# Patient Record
Sex: Female | Born: 1955 | Race: Black or African American | Hispanic: No | Marital: Married | Smoking: Never smoker
Health system: Southern US, Community
[De-identification: ages and names within clinical notes are randomized; demographics above are authoritative.]

## PROBLEM LIST (undated history)

## (undated) DIAGNOSIS — I10 Essential (primary) hypertension: Secondary | ICD-10-CM

## (undated) DIAGNOSIS — K5792 Diverticulitis of intestine, part unspecified, without perforation or abscess without bleeding: Secondary | ICD-10-CM

## (undated) DIAGNOSIS — C569 Malignant neoplasm of unspecified ovary: Secondary | ICD-10-CM

## (undated) HISTORY — PX: WRIST FRACTURE SURGERY: SHX121

---

## 1998-09-12 ENCOUNTER — Emergency Department (HOSPITAL_COMMUNITY): Admission: EM | Admit: 1998-09-12 | Discharge: 1998-09-12 | Payer: Self-pay

## 2001-07-24 ENCOUNTER — Emergency Department (HOSPITAL_COMMUNITY): Admission: EM | Admit: 2001-07-24 | Discharge: 2001-07-24 | Payer: Self-pay | Admitting: Emergency Medicine

## 2003-12-31 ENCOUNTER — Emergency Department (HOSPITAL_COMMUNITY): Admission: EM | Admit: 2003-12-31 | Discharge: 2003-12-31 | Payer: Self-pay | Admitting: Emergency Medicine

## 2004-03-06 ENCOUNTER — Ambulatory Visit (HOSPITAL_COMMUNITY): Admission: RE | Admit: 2004-03-06 | Discharge: 2004-03-06 | Payer: Self-pay | Admitting: Gastroenterology

## 2004-10-18 ENCOUNTER — Emergency Department (HOSPITAL_COMMUNITY): Admission: EM | Admit: 2004-10-18 | Discharge: 2004-10-18 | Payer: Self-pay | Admitting: Emergency Medicine

## 2004-10-20 ENCOUNTER — Ambulatory Visit (HOSPITAL_BASED_OUTPATIENT_CLINIC_OR_DEPARTMENT_OTHER): Admission: RE | Admit: 2004-10-20 | Discharge: 2004-10-20 | Payer: Self-pay | Admitting: Orthopedic Surgery

## 2007-09-09 ENCOUNTER — Emergency Department (HOSPITAL_COMMUNITY): Admission: EM | Admit: 2007-09-09 | Discharge: 2007-09-09 | Payer: Self-pay | Admitting: Emergency Medicine

## 2013-05-13 ENCOUNTER — Emergency Department (HOSPITAL_BASED_OUTPATIENT_CLINIC_OR_DEPARTMENT_OTHER)
Admission: EM | Admit: 2013-05-13 | Discharge: 2013-05-13 | Disposition: A | Discharge status: Home / Self Care | Payer: 59 | Attending: Emergency Medicine | Admitting: Emergency Medicine

## 2013-05-13 ENCOUNTER — Encounter (HOSPITAL_BASED_OUTPATIENT_CLINIC_OR_DEPARTMENT_OTHER): Payer: Self-pay | Admitting: Emergency Medicine

## 2013-05-13 DIAGNOSIS — J029 Acute pharyngitis, unspecified: Secondary | ICD-10-CM | POA: Insufficient documentation

## 2013-05-13 LAB — RAPID STREP SCREEN (MED CTR MEBANE ONLY): Streptococcus, Group A Screen (Direct): NEGATIVE

## 2013-05-13 NOTE — ED Notes (Signed)
Here with sore throat and bilateral ear pain x 5 days. No cold symptoms prior to sore throat

## 2013-05-13 NOTE — ED Provider Notes (Signed)
I saw and evaluated the patient, reviewed the resident's note and I agree with the findings and plan.  EKG Interpretation   None         Malvin Johns, MD 05/13/13 1444

## 2013-05-13 NOTE — ED Provider Notes (Signed)
CSN: [id number]     Arrival date & time 05/13/13  1304 History   First MD Initiated Contact with Patient 05/13/13 1320     Chief Complaint  Patient presents with  . Sore Throat   (Consider location/radiation/quality/duration/timing/severity/associated sxs/prior Treatment) Patient is a 57 y.o. female presenting with pharyngitis.  Sore Throat Associated symptoms include a sore throat. Pertinent negatives include no abdominal pain, chest pain, chills, congestion, coughing, fever or headaches.    57 year old female here with sore throat for 5 days. She states that her sore throat has gotten worse and now is improving slightly but still very sore. She denies any fevers, chills, sweats. She denies any obvious sick contacts or history of strep throat. She's been drinking normally but having painful swallowing food.  History reviewed. No pertinent past medical history. History reviewed. No pertinent past surgical history. No family history on file. History  Substance Use Topics  . Smoking status: Never Smoker   . Smokeless tobacco: Not on file  . Alcohol Use: Not on file   OB History   Grav Para Term Preterm Abortions TAB SAB Ect Mult Living                 Review of Systems  Constitutional: Negative for fever, chills and appetite change.  HENT: Positive for sore throat. Negative for congestion.   Respiratory: Negative for cough and shortness of breath.   Cardiovascular: Negative for chest pain.  Gastrointestinal: Negative for abdominal pain.  Genitourinary: Negative for dysuria.  Neurological: Negative for headaches.  All other systems reviewed and are negative.    Allergies  Review of patient's allergies indicates no known allergies.  Home Medications  No current outpatient prescriptions on file. BP 160/106  Pulse 85  Temp(Src) 99 F (37.2 C) (Oral)  Wt 184 lb (83.462 kg)  SpO2 96% Physical Exam  Nursing note and vitals reviewed. Constitutional: She is oriented to  person, place, and time. She appears well-developed and well-nourished. No distress.  HENT:  Head: Normocephalic and atraumatic.  Erythematous pharynx, tonsils swollen but no exudate, symmetric tonsils.   Eyes: EOM are normal. Pupils are equal, round, and reactive to light.  Neck: Neck supple.  Cardiovascular: Normal rate, regular rhythm and normal heart sounds.   No murmur heard. Pulmonary/Chest: Effort normal and breath sounds normal.  Abdominal: Soft. Bowel sounds are normal. There is no tenderness. There is no guarding.  Musculoskeletal: She exhibits no edema.  Lymphadenopathy:    She has cervical adenopathy.  Neurological: She is alert and oriented to person, place, and time.  Skin: Skin is warm and dry. She is not diaphoretic.  Psychiatric: She has a normal mood and affect.    ED Course  Procedures (including critical care time) Labs Review Labs Reviewed  RAPID STREP SCREEN  CULTURE, GROUP A STREP   Imaging Review No results found.  EKG Interpretation   None       MDM   1. Pharyngitis    57 year old female here with sore throat for 5 days. Strep negative here, we'll send for culture. On exam she is nontoxic nontoxic appearing, she's tolerating fluids easily, and she easily understands red flags provided. Discussed that his may be strep and that she will be contacted if she needs antibiotics,   Red flags reviewed Return for worsening symptoms.   Laroy Apple, MD [place] Resident, PGY-2 05/13/2013, 2:07 PM        Timmothy Euler, MD 05/13/13 [phone]

## 2013-05-15 LAB — CULTURE, GROUP A STREP

## 2016-06-10 DIAGNOSIS — I10 Essential (primary) hypertension: Secondary | ICD-10-CM | POA: Insufficient documentation

## 2016-07-07 LAB — HM MAMMOGRAPHY

## 2016-07-13 DIAGNOSIS — E785 Hyperlipidemia, unspecified: Secondary | ICD-10-CM | POA: Insufficient documentation

## 2016-07-31 LAB — HM COLONOSCOPY

## 2019-08-19 ENCOUNTER — Ambulatory Visit: Payer: Self-pay | Attending: Internal Medicine

## 2019-08-19 DIAGNOSIS — Z23 Encounter for immunization: Secondary | ICD-10-CM | POA: Insufficient documentation

## 2019-08-19 NOTE — Progress Notes (Signed)
   Covid-19 Vaccination Clinic  Name:  Tracy Goodman    MRN: [id number] DOB: [birth year]  08/19/2019  Tracy Goodman was observed post Covid-19 immunization for 15 minutes without incidence. She was provided with Vaccine Information Sheet and instruction to access the V-Safe system.   Tracy Goodman was instructed to call 911 with any severe reactions post vaccine: Marland Kitchen Difficulty breathing  . Swelling of your face and throat  . A fast heartbeat  . A bad rash all over your body  . Dizziness and weakness    Immunizations Administered    Name Date Dose VIS Date Route   Pfizer COVID-19 Vaccine 08/19/2019  4:22 PM 0.3 mL 06/02/2019 Intramuscular   Manufacturer: [place]   Lot: [id number]   [place]: [id number]

## 2019-09-09 ENCOUNTER — Ambulatory Visit: Payer: Self-pay | Attending: Internal Medicine

## 2019-09-09 DIAGNOSIS — Z23 Encounter for immunization: Secondary | ICD-10-CM

## 2019-09-09 NOTE — Progress Notes (Signed)
   Covid-19 Vaccination Clinic  Name:  Tracy Goodman    MRN: [id number] DOB: [birth year]  09/09/2019  [name] was observed post Covid-19 immunization for 15 minutes without incident. She was provided with Vaccine Information Sheet and instruction to access the V-Safe system.   [name] was instructed to call 911 with any severe reactions post vaccine: Marland Kitchen Difficulty breathing  . Swelling of face and throat  . A fast heartbeat  . A bad rash all over body  . Dizziness and weakness   Immunizations Administered    Name Date Dose VIS Date Route   Pfizer COVID-19 Vaccine 09/09/2019  9:48 AM 0.3 mL 06/02/2019 Intramuscular   Manufacturer: [place]   Lot: [id number]   [place]: [id number]

## 2019-09-13 ENCOUNTER — Ambulatory Visit: Payer: Self-pay

## 2020-03-12 ENCOUNTER — Other Ambulatory Visit: Payer: Self-pay

## 2020-03-12 ENCOUNTER — Encounter: Payer: Self-pay | Admitting: Family Medicine

## 2020-03-12 ENCOUNTER — Ambulatory Visit: Payer: BC Managed Care – PPO | Admitting: Family Medicine

## 2020-03-12 VITALS — BP 142/80 | HR 68 | Temp 97.6°F | Ht 59.5 in | Wt 259.5 lb

## 2020-03-12 DIAGNOSIS — E782 Mixed hyperlipidemia: Secondary | ICD-10-CM | POA: Diagnosis not present

## 2020-03-12 DIAGNOSIS — E049 Nontoxic goiter, unspecified: Secondary | ICD-10-CM

## 2020-03-12 DIAGNOSIS — Z23 Encounter for immunization: Secondary | ICD-10-CM

## 2020-03-12 DIAGNOSIS — I10 Essential (primary) hypertension: Secondary | ICD-10-CM

## 2020-03-12 NOTE — Assessment & Plan Note (Signed)
Encouraged weight loss and healthy diet

## 2020-03-12 NOTE — Progress Notes (Signed)
Subjective:     DAYANNE YIU is a 64 y.o. female presenting for Establish Care and Thyroid Problem (hx of this but feels like its getting bigger)     HPI   #Enlarged thyroid - feels like her thyroid is getting large - no nodules, just the size - feels like a lump is in her throat when she swallows  #Skin lesions - itchy - was worse - since it got hot - on the arms  #HTN - no hx of treatment - home monitoring is "never" high -    Review of Systems   Social History   Tobacco Use  Smoking Status Never Smoker  Smokeless Tobacco Never Used        Objective:    BP Readings from Last 3 Encounters:  03/12/20 (!) 142/80  05/13/13 (!) 160/106   Wt Readings from Last 3 Encounters:  03/12/20 259 lb 8 oz (117.7 kg)  05/13/13 184 lb (83.5 kg)    BP (!) 142/80   Pulse 68   Temp 97.6 F (36.4 C) (Temporal)   Ht 4' 11.5" (1.511 m)   Wt 259 lb 8 oz (117.7 kg)   SpO2 100%   BMI 51.54 kg/m    Physical Exam Constitutional:      General: She is not in acute distress.    Appearance: She is well-developed. She is not diaphoretic.  HENT:     Right Ear: External ear normal.     Left Ear: External ear normal.     Nose: Nose normal.  Eyes:     Conjunctiva/sclera: Conjunctivae normal.  Neck:     Thyroid: Thyromegaly present.  Cardiovascular:     Rate and Rhythm: Normal rate and regular rhythm.     Heart sounds: No murmur heard.   Pulmonary:     Effort: Pulmonary effort is normal. No respiratory distress.     Breath sounds: Normal breath sounds. No wheezing.  Musculoskeletal:     Cervical back: Neck supple.  Skin:    General: Skin is warm and dry.     Capillary Refill: Capillary refill takes less than 2 seconds.  Neurological:     Mental Status: She is alert. Mental status is at baseline.  Psychiatric:        Mood and Affect: Mood normal.        Behavior: Behavior normal.           Assessment & Plan:   Problem List Items Addressed This  Visit      Cardiovascular and Mediastinum   Hypertension    Encouraged home monitoring. Elevated today. Lifestyle changes. Return in 3 months for recheck. Labs today      Relevant Orders   Lipid panel   Comprehensive metabolic panel   TSH     Endocrine   Enlarged thyroid gland - Primary    Reports previously told it was enlarged, but unable to find prior imaging. Will get imaging as thyroid enlarged and check TSH      Relevant Orders   TSH   US THYROID     Other   Hyperlipidemia    Outside labs elevated from 2017. Will recheck today. Advised diet and exercise and weight loss.       Relevant Orders   Lipid panel   Morbid obesity ([place])    Encouraged weight loss and healthy diet      Relevant Orders   Lipid panel   Comprehensive metabolic panel  Return in about 3 months (around 06/11/2020) for annual wellness visit.  [name], MD  This visit occurred during the SARS-CoV-2 public health emergency.  Safety protocols were in place, including screening questions prior to the visit, additional usage of staff PPE, and extensive cleaning of exam room while observing appropriate contact time as indicated for disinfecting solutions.

## 2020-03-12 NOTE — Patient Instructions (Addendum)
Rash - try hydrocortisone (over the counter) cream - if no improvement - call and I can send in triamcinolone to try  Thyroid - repeat imaging - you should get a phone call in 1-2 weeks to schedule - labs today   Your blood pressure high. And your cholesterol has been high in the past.   High blood pressure increases your risk for heart attack and stroke.    Please check your blood pressure 2-4 times a week.   To check your blood pressure 1) Sit in a quiet and relaxed place for 5 minutes 2) Make sure your feet are flat on the ground 3) Consider checking first thing in the morning   Normal blood pressure is less than 140/90 Ideally you blood pressure should be around 120/80  Other ways you can reduce your blood pressure:  1) Regular exercise -- Try to get 150 minutes (30 minutes, 5 days a week) of moderate to vigorous aerobic excercise -- Examples: brisk walking (2.5 miles per hour), water aerobics, dancing, gardening, tennis, biking slower than 10 miles per hour 2) DASH Diet - low fat meats, more fresh fruits and vegetables, whole grains, low salt 3) Quit smoking if you smoke 4) Loose 5-10% of your body weight  DASH Eating Plan DASH stands for "Dietary Approaches to Stop Hypertension." The DASH eating plan is a healthy eating plan that has been shown to reduce high blood pressure (hypertension). It may also reduce your risk for type 2 diabetes, heart disease, and stroke. The DASH eating plan may also help with weight loss. What are tips for following this plan?  General guidelines  Avoid eating more than 2,300 mg (milligrams) of salt (sodium) a day. If you have hypertension, you may need to reduce your sodium intake to 1,500 mg a day.  Limit alcohol intake to no more than 1 drink a day for nonpregnant women and 2 drinks a day for men. One drink equals 12 oz of beer, 5 oz of wine, or 1 oz of hard liquor.  Work with your health care provider to maintain a healthy body weight  or to lose weight. Ask what an ideal weight is for you.  Get at least 30 minutes of exercise that causes your heart to beat faster (aerobic exercise) most days of the week. Activities may include walking, swimming, or biking.  Work with your health care provider or diet and nutrition specialist (dietitian) to adjust your eating plan to your individual calorie needs. Reading food labels   Check food labels for the amount of sodium per serving. Choose foods with less than 5 percent of the Daily Value of sodium. Generally, foods with less than 300 mg of sodium per serving fit into this eating plan.  To find whole grains, look for the word "whole" as the first word in the ingredient list. Shopping  Buy products labeled as "low-sodium" or "no salt added."  Buy fresh foods. Avoid canned foods and premade or frozen meals. Cooking  Avoid adding salt when cooking. Use salt-free seasonings or herbs instead of table salt or sea salt. Check with your health care provider or pharmacist before using salt substitutes.  Do not fry foods. Cook foods using healthy methods such as baking, boiling, grilling, and broiling instead.  Cook with heart-healthy oils, such as olive, canola, soybean, or sunflower oil. Meal planning  Eat a balanced diet that includes: ? 5 or more servings of fruits and vegetables each day. At each meal, try to fill  half of your plate with fruits and vegetables. ? Up to 6-8 servings of whole grains each day. ? Less than 6 oz of lean meat, poultry, or fish each day. A 3-oz serving of meat is about the same size as a deck of cards. One egg equals 1 oz. ? 2 servings of low-fat dairy each day. ? A serving of nuts, seeds, or beans 5 times each week. ? Heart-healthy fats. Healthy fats called Omega-3 fatty acids are found in foods such as flaxseeds and coldwater fish, like sardines, salmon, and mackerel.  Limit how much you eat of the following: ? Canned or prepackaged foods. ? Food  that is high in trans fat, such as fried foods. ? Food that is high in saturated fat, such as fatty meat. ? Sweets, desserts, sugary drinks, and other foods with added sugar. ? Full-fat dairy products.  Do not salt foods before eating.  Try to eat at least 2 vegetarian meals each week.  Eat more home-cooked food and less restaurant, buffet, and fast food.  When eating at a restaurant, ask that your food be prepared with less salt or no salt, if possible. What foods are recommended? The items listed may not be a complete list. Talk with your dietitian about what dietary choices are best for you. Grains Whole-grain or whole-wheat bread. Whole-grain or whole-wheat pasta. Brown rice. [place]. Bulgur. Whole-grain and low-sodium cereals. Pita bread. Low-fat, low-sodium crackers. Whole-wheat flour tortillas. Vegetables Fresh or frozen vegetables (raw, steamed, roasted, or grilled). Low-sodium or reduced-sodium tomato and vegetable juice. Low-sodium or reduced-sodium tomato sauce and tomato paste. Low-sodium or reduced-sodium canned vegetables. Fruits All fresh, dried, or frozen fruit. Canned fruit in natural juice (without added sugar). Meat and other protein foods Skinless chicken or [place]. Ground chicken or [place]. Pork with fat trimmed off. Fish and seafood. Egg whites. Dried beans, peas, or lentils. Unsalted nuts, nut butters, and seeds. Unsalted canned beans. Lean cuts of beef with fat trimmed off. Low-sodium, lean deli meat. Dairy Low-fat (1%) or fat-free (skim) milk. Fat-free, low-fat, or reduced-fat cheeses. Nonfat, low-sodium ricotta or cottage cheese. Low-fat or nonfat yogurt. Low-fat, low-sodium cheese. Fats and oils Soft margarine without trans fats. Vegetable oil. Low-fat, reduced-fat, or light mayonnaise and salad dressings (reduced-sodium). Canola, safflower, olive, soybean, and sunflower oils. Avocado. Seasoning and other foods Herbs. Spices. Seasoning mixes without salt.  Unsalted popcorn and pretzels. Fat-free sweets. What foods are not recommended? The items listed may not be a complete list. Talk with your dietitian about what dietary choices are best for you. Grains Baked goods made with fat, such as croissants, muffins, or some breads. Dry pasta or rice meal packs. Vegetables Creamed or fried vegetables. Vegetables in a cheese sauce. Regular canned vegetables (not low-sodium or reduced-sodium). Regular canned tomato sauce and paste (not low-sodium or reduced-sodium). Regular tomato and vegetable juice (not low-sodium or reduced-sodium). Angie Fava. Olives. Fruits Canned fruit in a light or heavy syrup. Fried fruit. Fruit in cream or butter sauce. Meat and other protein foods Fatty cuts of meat. Ribs. Fried meat. Berniece Salines. Sausage. Bologna and other processed lunch meats. Salami. Fatback. Hotdogs. Bratwurst. Salted nuts and seeds. Canned beans with added salt. Canned or smoked fish. Whole eggs or egg yolks. Chicken or [place] with skin. Dairy Whole or 2% milk, cream, and half-and-half. Whole or full-fat cream cheese. Whole-fat or sweetened yogurt. Full-fat cheese. Nondairy creamers. Whipped toppings. Processed cheese and cheese spreads. Fats and oils Butter. Stick margarine. Lard. Shortening. Ghee. Bacon fat. Tropical  oils, such as coconut, palm kernel, or palm oil. Seasoning and other foods Salted popcorn and pretzels. Onion salt, garlic salt, seasoned salt, table salt, and sea salt. Worcestershire sauce. Tartar sauce. Barbecue sauce. Teriyaki sauce. Soy sauce, including reduced-sodium. Steak sauce. Canned and packaged gravies. Fish sauce. Oyster sauce. Cocktail sauce. Horseradish that you find on the shelf. Ketchup. Mustard. Meat flavorings and tenderizers. Bouillon cubes. Hot sauce and Tabasco sauce. Premade or packaged marinades. Premade or packaged taco seasonings. Relishes. Regular salad dressings. Where to find more information:  National Heart, Lung, and [place]: https://wilson-eaton.com/  American Heart Association: www.heart.org Summary  The DASH eating plan is a healthy eating plan that has been shown to reduce high blood pressure (hypertension). It may also reduce your risk for type 2 diabetes, heart disease, and stroke.  With the DASH eating plan, you should limit salt (sodium) intake to 2,300 mg a day. If you have hypertension, you may need to reduce your sodium intake to 1,500 mg a day.  When on the DASH eating plan, aim to eat more fresh fruits and vegetables, whole grains, lean proteins, low-fat dairy, and heart-healthy fats.  Work with your health care provider or diet and nutrition specialist (dietitian) to adjust your eating plan to your individual calorie needs. This information is not intended to replace advice given to you by your health care provider. Make sure you discuss any questions you have with your health care provider. Document Revised: 05/21/2017 Document Reviewed: 06/01/2016 Elsevier Patient Education  2020 [employer].

## 2020-03-12 NOTE — Assessment & Plan Note (Signed)
Outside labs elevated from 2017. Will recheck today. Advised diet and exercise and weight loss.

## 2020-03-12 NOTE — Assessment & Plan Note (Signed)
Reports previously told it was enlarged, but unable to find prior imaging. Will get imaging as thyroid enlarged and check TSH

## 2020-03-12 NOTE — Assessment & Plan Note (Signed)
Encouraged home monitoring. Elevated today. Lifestyle changes. Return in 3 months for recheck. Labs today

## 2020-03-13 LAB — COMPREHENSIVE METABOLIC PANEL
ALT: 10 U/L (ref 0–35)
AST: 13 U/L (ref 0–37)
Albumin: 4 g/dL (ref 3.5–5.2)
Alkaline Phosphatase: 77 U/L (ref 39–117)
BUN: 12 mg/dL (ref 6–23)
CO2: 29 mEq/L (ref 19–32)
Calcium: 9.6 mg/dL (ref 8.4–10.5)
Chloride: 104 mEq/L (ref 96–112)
Creatinine, Ser: 1.05 mg/dL (ref 0.40–1.20)
GFR: 64.14 mL/min (ref >60.00)
Glucose, Bld: 89 mg/dL (ref 70–99)
Potassium: 4.2 mEq/L (ref 3.5–5.1)
Sodium: 140 mEq/L (ref 135–145)
Total Bilirubin: 0.4 mg/dL (ref 0.2–1.2)
Total Protein: 7.2 g/dL (ref 6.0–8.3)

## 2020-03-13 LAB — LIPID PANEL
Cholesterol: 216 mg/dL — ABNORMAL HIGH (ref 0–200)
HDL: 50.5 mg/dL (ref >39.00)
LDL Cholesterol: 146 mg/dL — ABNORMAL HIGH (ref 0–99)
NonHDL: 165.61
Total CHOL/HDL Ratio: 4
Triglycerides: 98 mg/dL (ref 0.0–149.0)
VLDL: 19.6 mg/dL (ref 0.0–40.0)

## 2020-03-13 LAB — TSH: TSH: 1.45 u[IU]/mL (ref 0.35–4.50)

## 2020-03-14 ENCOUNTER — Telehealth: Payer: Self-pay | Admitting: Family Medicine

## 2020-03-14 NOTE — Telephone Encounter (Signed)
FYI: Pt will wait on starting atorvastatin until she comes back for follow-up.

## 2020-03-14 NOTE — Telephone Encounter (Signed)
Pt called back stating she wants to hold off on medication until she see dr cody again

## 2020-03-27 ENCOUNTER — Ambulatory Visit: Payer: BC Managed Care – PPO

## 2020-06-12 ENCOUNTER — Encounter: Payer: BC Managed Care – PPO | Admitting: Family Medicine

## 2020-06-25 ENCOUNTER — Encounter: Payer: Self-pay | Admitting: Family Medicine

## 2020-06-25 ENCOUNTER — Other Ambulatory Visit: Payer: Self-pay | Admitting: Family Medicine

## 2020-06-25 ENCOUNTER — Ambulatory Visit (INDEPENDENT_AMBULATORY_CARE_PROVIDER_SITE_OTHER): Payer: Self-pay | Admitting: Family Medicine

## 2020-06-25 ENCOUNTER — Other Ambulatory Visit: Payer: Self-pay

## 2020-06-25 VITALS — BP 144/88 | HR 91 | Temp 97.8°F | Ht 60.0 in | Wt 247.8 lb

## 2020-06-25 DIAGNOSIS — Z0001 Encounter for general adult medical examination with abnormal findings: Secondary | ICD-10-CM

## 2020-06-25 DIAGNOSIS — I10 Essential (primary) hypertension: Secondary | ICD-10-CM

## 2020-06-25 DIAGNOSIS — Z1231 Encounter for screening mammogram for malignant neoplasm of breast: Secondary | ICD-10-CM

## 2020-06-25 DIAGNOSIS — E782 Mixed hyperlipidemia: Secondary | ICD-10-CM

## 2020-06-25 NOTE — Assessment & Plan Note (Signed)
She will work on diet/exercise/weight loss and return in 3 months.

## 2020-06-25 NOTE — Progress Notes (Signed)
Annual Exam   Chief Complaint:  Chief Complaint  Patient presents with  . Annual Exam    History of Present Illness:  [name] is a 65 y.o. No obstetric history on file. who LMP was No LMP recorded. Patient is postmenopausal., presents today for her annual examination.     Nutrition She does get adequate calcium and Vitamin D in her diet. Diet: green smoothies, more fish and chicken, no pork/beef Exercise: apartment gym weight liftings  Safety The patient wears seatbelts: yes.     The patient feels safe at home and in their relationships: yes.   Menstrual:  Symptoms of menopause: none   GYN She is single partner, contraception - post menopausal status.    Cervical Cancer Screening (21-65):   Last Pap:   December 2017 Results were: no abnormalities /neg HPV DNA   Breast Cancer Screening (Age 43-74):  There is no FH of breast cancer. There is no FH of ovarian cancer. BRCA screening Not Indicated.  Last Mammogram: 2018 The patient does want a mammogram this year.    Colon Cancer Screening:  Age 66-75 yo - benefits outweigh the risk. Adults 63-85 yo who have never been screened benefit.  Benefits: 134000 people in 2016 will be diagnosed and 49,000 will die - early detection helps [place]ms: Complications 2/2 to colonoscopy High Risk (Colonoscopy): genetic disorder (Lynch syndrome or familial adenomatous polyposis), personal hx of IBD, previous adenomatous polyp, or previous colorectal cancer, FamHx start 10 years before the age at diagnosis, increased in males and black race  Options:  FIT - looks for hemoglobin (blood in the stool) - specific and fairly sensitive - must be done annually Cologuard - looks for DNA and blood - more sensitive - therefore can have more false positives, every 3 years Colonoscopy - every 10 years if normal - sedation, bowl prep, must have someone drive you  Shared decision making and the patient had decided to do colonoscopy - last  was 2018.   Social History   Tobacco Use  Smoking Status Never Smoker  Smokeless Tobacco Never Used    Lung Cancer Screening (Ages 03-55): not applicable   Weight Wt Readings from Last 3 Encounters:  06/25/20 247 lb 12 oz (112.4 kg)  03/12/20 259 lb 8 oz (117.7 kg)  05/13/13 184 lb (83.5 kg)   Patient has very high BMI  BMI Readings from Last 1 Encounters:  06/25/20 48.39 kg/m     Chronic disease screening Blood pressure monitoring:  BP Readings from Last 3 Encounters:  06/25/20 (!) 144/88  03/12/20 (!) 142/80  05/13/13 (!) 160/106    Lipid Monitoring: Indication for screening: age >75, obesity, diabetes, family hx, CV risk factors.  Lipid screening: Not Indicated  Lab Results  Component Value Date   CHOL 216 (H) 03/12/2020   HDL 50.50 03/12/2020   LDLCALC 146 (H) 03/12/2020   TRIG 98.0 03/12/2020   CHOLHDL 4 03/12/2020     Diabetes Screening: age >69, overweight, family hx, PCOS, hx of gestational diabetes, at risk ethnicity Diabetes Screening screening: Yes  No results found for: HGBA1C   No past medical history on file.  Past Surgical History:  Procedure Laterality Date  . CESAREAN SECTION    . WRIST FRACTURE SURGERY Left     Prior to Admission medications   Medication Sig Start Date End Date Taking? Authorizing Provider  Naproxen Sod-diphenhydrAMINE (ALEVE PM PO) Take 220 mg by mouth as needed. For knee pain   Yes  [provider]    No Known Allergies  Gynecologic History: No LMP recorded. Patient is postmenopausal.  Obstetric History: No obstetric history on file.  Social History   Socioeconomic History  . Marital status: Married    Spouse name: Tempie Donning  . Number of children: 3  . Years of education: high school  . Highest education level: Not on file  Occupational History  . Not on file  Tobacco Use  . Smoking status: Never Smoker  . Smokeless tobacco: Never Used  Substance and Sexual Activity  . Alcohol use: Not  Currently  . Drug use: Never  . Sexual activity: Yes    Birth control/protection: Post-menopausal  Other Topics Concern  . Not on file  Social History Narrative   03/12/20   From: the area   Living: with Tempie Donning, husband (2012) and one step son   Work: school bus driver      Family: 3 adult children - [place], [place], Trilby Drummer - 6-7 grandchildren      Enjoys: singing in the choir at church      Exercise: weight lifting in the morning, stretches   Diet: varied, tries to eat healthy - baked salmon and fish/chicken/fried food      Safety   Seat belts: Yes    Guns: No   Safe in relationships: Yes    Social Determinants of Radio broadcast assistant Strain: Not on file  Food Insecurity: Not on file  Transportation Needs: Not on file  Physical Activity: Not on file  Stress: Not on file  Social Connections: Not on file  Intimate Partner Violence: Not on file    Family History  Problem Relation Age of Onset  . COPD Mother   . Heart failure Mother     Review of Systems  Constitutional: Negative for chills and fever.  HENT: Negative for congestion and sore throat.   Eyes: Negative for blurred vision and double vision.  Respiratory: Negative for shortness of breath.   Cardiovascular: Negative for chest pain.  Gastrointestinal: Negative for heartburn, nausea and vomiting.  Genitourinary: Negative.   Musculoskeletal: Negative.  Negative for myalgias.  Skin: Negative for rash.  Neurological: Negative for dizziness and headaches.  Endo/Heme/Allergies: Does not bruise/bleed easily.  Psychiatric/Behavioral: Negative for depression. The patient is not nervous/anxious.      Physical Exam BP (!) 144/88   Pulse 91   Temp 97.8 F (36.6 C) (Temporal)   Ht 5' (1.524 m)   Wt 247 lb 12 oz (112.4 kg)   SpO2 98%   BMI 48.39 kg/m    BP Readings from Last 3 Encounters:  06/25/20 (!) 144/88  03/12/20 (!) 142/80  05/13/13 (!) 160/106      Physical Exam Constitutional:       General: She is not in acute distress.    Appearance: She is well-developed and well-nourished. She is obese. She is not diaphoretic.  HENT:     Head: Normocephalic and atraumatic.     Right Ear: External ear normal.     Left Ear: External ear normal.     Nose: Nose normal.     Mouth/Throat:     Mouth: Oropharynx is clear and moist.  Eyes:     General: No scleral icterus.    Extraocular Movements: EOM normal.     Conjunctiva/sclera: Conjunctivae normal.  Cardiovascular:     Rate and Rhythm: Normal rate and regular rhythm.     Heart sounds: No murmur heard.   Pulmonary:  Effort: Pulmonary effort is normal. No respiratory distress.     Breath sounds: Normal breath sounds. No wheezing.  Abdominal:     General: Bowel sounds are normal. There is no distension.     Palpations: Abdomen is soft. There is no mass.     Tenderness: There is no abdominal tenderness. There is no guarding or rebound.  Musculoskeletal:        General: No edema. Normal range of motion.     Cervical back: Neck supple.  Lymphadenopathy:     Cervical: No cervical adenopathy.  Skin:    General: Skin is warm and dry.     Capillary Refill: Capillary refill takes less than 2 seconds.  Neurological:     Mental Status: She is alert and oriented to person, place, and time.     Deep Tendon Reflexes: Strength normal. Reflexes normal.  Psychiatric:        Mood and Affect: Mood and affect normal.        Behavior: Behavior normal.     Results:  PHQ-9:   Depression screen [facility] 2/9 03/12/2020  Decreased Interest 0  Down, Depressed, Hopeless 0  PHQ - 2 Score 0       Assessment: 65 y.o. No obstetric history on file. female here for routine annual physical examination.  Plan: Problem List Items Addressed This Visit      Cardiovascular and Mediastinum   Hypertension    She will work on diet/exercise/weight loss and return in 3 months.         Other   Hyperlipidemia    Discussed recommendation for  statin. She will work on diet/exercise/weight loss and recheck in 3 months.        Other Visit Diagnoses    Encounter for annual general medical examination with abnormal findings in adult    -  Primary      Screening: -- Blood pressure screen elevated: continued to monitor. -- cholesterol screening: elevated -- Weight screening: obese: discussed management options, including lifestyle, dietary, and exercise. -- Diabetes Screening: not due for screening -- Nutrition: Encouraged healthy diet  The 10-year ASCVD risk score Mikey Bussing DC Jr., et al., 2013) is: 9.3%   Values used to calculate the score:     Age: 34 years     Sex: Female     Is Non-Hispanic African American: Yes     Diabetic: No     Tobacco smoker: No     Systolic Blood Pressure: 767 mmHg     Is BP treated: No     HDL Cholesterol: 50.5 mg/dL     Total Cholesterol: 216 mg/dL  -- Statin therapy for Age 16-75 with CVD risk >7.5%  Psych -- Depression screening (PHQ-9):   Depression screen [facility] 2/9 03/12/2020  Decreased Interest 0  Down, Depressed, Hopeless 0  PHQ - 2 Score 0      Safety -- tobacco screening: not using -- alcohol screening:  low-risk usage. -- no evidence of domestic violence or intimate partner violence.   Cancer Screening -- pap smear not collected per ASCCP guidelines -- family history of breast cancer screening: done. not at high risk. -- Mammogram - pt will schedule -- Colon cancer (age 76+)-- up to date  Immunizations Immunization History  Administered Date(s) Administered  . Influenza,inj,Quad PF,6+ Mos 03/12/2020  . PFIZER SARS-COV-2 Vaccination 08/19/2019, 09/09/2019    -- flu vaccine up to date -- TDAP q10 years unknown, record requested -- Shingles (age >54) pt will consider getting --  Covid-19 Vaccine - encouraged getting booster   Encouraged healthy diet and exercise. Encouraged regular vision and dental care.    [name], MD

## 2020-06-25 NOTE — Patient Instructions (Addendum)
Call insurance about the ultrasound  Check with insurance about the shingles vaccine   Please call the location of your choice from the menu below to schedule your Mammogram and/or Bone Density appointment.     [place]  [address] [place] [facility]   Phone:  [phone]   [place], [place] [address]                                            Services: 3D Mammogram and Bone Density    Your blood pressure high.   High blood pressure increases your risk for heart attack and stroke.    Please check your blood pressure 2-4 times a week.   To check your blood pressure 1) Sit in a quiet and relaxed place for 5 minutes 2) Make sure your feet are flat on the ground 3) Consider checking first thing in the morning   Normal blood pressure is less than 140/90 Ideally you blood pressure should be around 120/80  Other ways you can reduce your blood pressure:  1) Regular exercise -- Try to get 150 minutes (30 minutes, 5 days a week) of moderate to vigorous aerobic excercise -- Examples: brisk walking (2.5 miles per hour), water aerobics, dancing, gardening, tennis, biking slower than 10 miles per hour 2) DASH Diet - low fat meats, more fresh fruits and vegetables, whole grains, low salt 3) Quit smoking if you smoke 4) Loose 5-10% of your body weight

## 2020-06-25 NOTE — Assessment & Plan Note (Signed)
Discussed recommendation for statin. She will work on diet/exercise/weight loss and recheck in 3 months.

## 2020-07-18 ENCOUNTER — Other Ambulatory Visit: Payer: Self-pay | Admitting: *Deleted

## 2020-07-18 ENCOUNTER — Ambulatory Visit
Admission: RE | Admit: 2020-07-18 | Discharge: 2020-07-18 | Disposition: A | Discharge status: Home / Self Care | Payer: BC Managed Care – PPO | Source: Ambulatory Visit | Attending: Family Medicine | Admitting: Family Medicine

## 2020-07-18 ENCOUNTER — Inpatient Hospital Stay
Admission: RE | Admit: 2020-07-18 | Discharge: 2020-07-18 | Disposition: A | Discharge status: Home / Self Care | Payer: Self-pay | Source: Ambulatory Visit | Attending: *Deleted | Admitting: *Deleted

## 2020-07-18 ENCOUNTER — Other Ambulatory Visit: Payer: Self-pay

## 2020-07-18 DIAGNOSIS — Z1231 Encounter for screening mammogram for malignant neoplasm of breast: Secondary | ICD-10-CM | POA: Diagnosis present

## 2020-09-02 ENCOUNTER — Telehealth: Payer: Self-pay

## 2020-09-02 NOTE — Telephone Encounter (Signed)
I tried to call pt; left v/m requesting pt to call [place]. Sending note to R Selin Eisler LPN and Christy CMA.

## 2020-09-02 NOTE — Telephone Encounter (Signed)
[facility] - Client TELEPHONE ADVICE RECORD [facility] Patient Name: Tracy Goodman Gender: Female DOB: 01-Dec-1955 Age: 65 Y 12 D Return Phone Number: [phone] (Primary) Address: City/State/Zip: [place] [address] Client [facility] - Client Client Site [facility] - Day Physician Waunita Schooner- MD Contact Type Call Who Is Calling Patient / Member / Family / Caregiver Call Type Triage / Clinical Relationship To Patient Self Return Phone Number [phone] (Primary) Chief Complaint Abdominal Pain Reason for Call Symptomatic / Request for [place] said she is having sharp lower abdominal pain(1/10) and unable to pass gas. 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 Translation No Nurse Assessment Nurse: Zenia Resides, RN, Diane Date/Time Eilene Ghazi Time): 09/02/2020 11:05:34 AM Confirm and document reason for call. If symptomatic, describe symptoms. ---Caller states she is having some lower abd pain that comes and goes. It is all the way across lower abd. Pain is severe when it comes. No pain right now. Last BM was yesterday. Started Friday. Does the patient have any new or worsening symptoms? ---Yes Will a triage be completed? ---Yes Related visit to physician within the last 2 weeks? ---No Does the PT have any chronic conditions? (i.e. diabetes, asthma, this includes High risk factors for pregnancy, etc.) ---No Is this a behavioral health or substance abuse call? ---No Guidelines Guideline Title Affirmed Question Affirmed Notes Nurse Date/Time Eilene Ghazi Time) Abdominal Pain - Female [1] MODERATE pain (e.g., interferes with normal activities) AND [2] pain comes and goes (cramps) AND [3] present > 24 hours (Exception: pain with Vomiting or Diarrhea - see that Guideline) Zenia Resides, RN, Diane 09/02/2020 11:07:12  AM Disp. Time Eilene Ghazi Time) Disposition Final User PLEASE NOTE: All timestamps contained within this report are represented as [place] Standard Time. CONFIDENTIALTY NOTICE: This fax transmission is intended only for the addressee. It contains information that is legally privileged, confidential or otherwise protected from use or disclosure. If you are not the intended recipient, you are strictly prohibited from reviewing, disclosing, copying using or disseminating any of this information or taking any action in reliance on or regarding this information. If you have received this fax in error, please notify us immediately by telephone so that we can arrange for its return to [place]. Phone: [phone], Toll-Free: [phone], Fax: [phone] Page: 2 of 2 Call Id: [id number] 09/02/2020 11:09:30 AM See PCP within 24 Hours Yes Zenia Resides, RN, Diane Caller Disagree/Comply Comply Caller Understands Yes PreDisposition Did not know what to do Care Advice Given Per Guideline SEE PCP WITHIN 24 HOURS: * IF OFFICE WILL BE OPEN: You need to be examined within the next 24 hours. Call your doctor (or NP/PA) when the office opens and make an appointment. DIET: * Drink adequate fluids. Eat a bland diet. OTC MEDS - BISMUTH SUBSALICYLATE (E.G., KAOPECTATE, PEPTO-BISMOL): * Helps reduce abdominal cramping, diarrhea, and vomiting. CALL BACK IF: * Severe pain lasts over 1 hour * Constant pain lasts over 2 hours CARE ADVICE given per Abdominal Pain, Female (Adult) guideline. Referrals REFERRED TO PCP OFFICE

## 2020-09-02 NOTE — Telephone Encounter (Signed)
Left v/m requesting pt to cb to [facility].

## 2020-09-03 NOTE — Telephone Encounter (Signed)
If fever, nausea or vomiting and inability to tolerate PO or worsening abdominal pain with distention -- needs to go to the ER  If no improvement but not worsening recommend UC or office visit as soon as able.

## 2020-09-03 NOTE — Telephone Encounter (Signed)
Patient called stating that she started with abdominal pain Friday. Patient stated at times the pain level is at a 9 but now it is about a 5-6. Patient stated that it is painful for her to have a BM. Patient stated that she was constipated and took a laxative. Patient stated that she has not had a fever or diarrhea. 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 Patient stated that the pain occurs everytime that she has a BM.No appointments today at the office.

## 2020-09-03 NOTE — Telephone Encounter (Signed)
Patient notified as instructed by telephone and verbalized understanding. Patient stated that she had a bowel movement about an hour ago and the pain has subsided at this time. Patient was offered an appointment tomorrow which patient declined stating that she is driving students tomorrow and unable to come for an appointment until after 5:00 pm. 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 Patient scheduled an appointment with Dr. Einar Pheasant Thursday 09/05/20 at 12:00 noon. Patient was given ER precautions again and she verbalized understanding.Marland Kitchen

## 2020-09-04 ENCOUNTER — Encounter (HOSPITAL_COMMUNITY): Payer: Self-pay | Admitting: Emergency Medicine

## 2020-09-04 ENCOUNTER — Emergency Department (HOSPITAL_COMMUNITY): Payer: BC Managed Care – PPO

## 2020-09-04 ENCOUNTER — Emergency Department (HOSPITAL_COMMUNITY)
Admission: EM | Admit: 2020-09-04 | Discharge: 2020-09-04 | Disposition: A | Discharge status: Home / Self Care | Payer: BC Managed Care – PPO | Attending: Emergency Medicine | Admitting: Emergency Medicine

## 2020-09-04 ENCOUNTER — Other Ambulatory Visit: Payer: Self-pay

## 2020-09-04 DIAGNOSIS — K5792 Diverticulitis of intestine, part unspecified, without perforation or abscess without bleeding: Secondary | ICD-10-CM

## 2020-09-04 DIAGNOSIS — K5732 Diverticulitis of large intestine without perforation or abscess without bleeding: Secondary | ICD-10-CM | POA: Insufficient documentation

## 2020-09-04 DIAGNOSIS — I10 Essential (primary) hypertension: Secondary | ICD-10-CM | POA: Diagnosis not present

## 2020-09-04 DIAGNOSIS — N760 Acute vaginitis: Secondary | ICD-10-CM | POA: Diagnosis not present

## 2020-09-04 DIAGNOSIS — B9689 Other specified bacterial agents as the cause of diseases classified elsewhere: Secondary | ICD-10-CM

## 2020-09-04 DIAGNOSIS — R1030 Lower abdominal pain, unspecified: Secondary | ICD-10-CM | POA: Diagnosis present

## 2020-09-04 LAB — CBC
HCT: 42 % (ref 36.0–46.0)
Hemoglobin: 12.6 g/dL (ref 12.0–15.0)
MCH: 27.3 pg (ref 26.0–34.0)
MCHC: 30 g/dL (ref 30.0–36.0)
MCV: 90.9 fL (ref 80.0–100.0)
Platelets: 312 10*3/uL (ref 150–400)
RBC: 4.62 MIL/uL (ref 3.87–5.11)
RDW: 13.4 % (ref 11.5–15.5)
WBC: 6.2 10*3/uL (ref 4.0–10.5)
nRBC: 0 % (ref 0.0–0.2)

## 2020-09-04 LAB — URINALYSIS, ROUTINE W REFLEX MICROSCOPIC
Bilirubin Urine: NEGATIVE
Glucose, UA: NEGATIVE mg/dL
Ketones, ur: 5 mg/dL — AB
Nitrite: NEGATIVE
Protein, ur: NEGATIVE mg/dL
Specific Gravity, Urine: 1.021 (ref 1.005–1.030)
pH: 6 (ref 5.0–8.0)

## 2020-09-04 LAB — COMPREHENSIVE METABOLIC PANEL
ALT: 15 U/L (ref 0–44)
AST: 21 U/L (ref 15–41)
Albumin: 3.4 g/dL — ABNORMAL LOW (ref 3.5–5.0)
Alkaline Phosphatase: 91 U/L (ref 38–126)
Anion gap: 10 (ref 5–15)
BUN: 12 mg/dL (ref 8–23)
CO2: 24 mmol/L (ref 22–32)
Calcium: 9.2 mg/dL (ref 8.9–10.3)
Chloride: 109 mmol/L (ref 98–111)
Creatinine, Ser: 0.9 mg/dL (ref 0.44–1.00)
GFR, Estimated: >60 mL/min (ref >60)
Glucose, Bld: 102 mg/dL — ABNORMAL HIGH (ref 70–99)
Potassium: 4.2 mmol/L (ref 3.5–5.1)
Sodium: 143 mmol/L (ref 135–145)
Total Bilirubin: 0.4 mg/dL (ref 0.3–1.2)
Total Protein: 7.5 g/dL (ref 6.5–8.1)

## 2020-09-04 LAB — WET PREP, GENITAL
Sperm: NONE SEEN
Trich, Wet Prep: NONE SEEN
Yeast Wet Prep HPF POC: NONE SEEN

## 2020-09-04 LAB — LIPASE, BLOOD: Lipase: 29 U/L (ref 11–51)

## 2020-09-04 MED ORDER — METRONIDAZOLE 500 MG PO TABS: 500.0000 mg | ORAL_TABLET | Freq: Two times a day (BID) | ORAL | 0 refills | Status: DC

## 2020-09-04 MED ORDER — AMOXICILLIN-POT CLAVULANATE 875-125 MG PO TABS: 1.0000 | ORAL_TABLET | Freq: Two times a day (BID) | ORAL | 0 refills | Status: AC

## 2020-09-04 MED ORDER — IOHEXOL 300 MG/ML  SOLN
100.0000 mL | Freq: Once | INTRAMUSCULAR | Status: AC | PRN
  Administered 2020-09-04: 100 mL via INTRAVENOUS

## 2020-09-04 NOTE — Discharge Instructions (Addendum)
You came to the emergency department today to be evaluated for your dental pain.  Your lab work was reassuring.  Your CT scan showed signs of diverticulitis infection.  Because of this I have given you a prescription for Augmentin.  Please take 1 pill twice a day for the next 10 days.  I have also given you information to follow-up with a gastroenterologist.  Please call their office later today to schedule an appointment for follow-up.  Your wet prep exam showed that you have a bacterial vaginosis infection.  This have given you prescription for metronidazole this take 1 pill twice a day for the next 7 days.  These follow-up with your OB/GYN provider or primary care provider for follow-up.  Your CT scan also showed that you had small umbilical and bilateral inguinal hernias.  Patient to follow-up with general surgery about this.  The results for your gonorrhea chlamydia tests are pending.  If positive you will receive a phone call.  If your tests are positive please go to your primary care provider or [facility] department immediately to receive treatment.  You may also find your results on your [facility] MyChart.  Get help right away if: Your pain gets worse. Your symptoms do not get better with treatment. Your symptoms suddenly get worse. You have a fever. You vomit more than one time. You have stools that are bloody, black, or tarry.

## 2020-09-04 NOTE — ED Triage Notes (Signed)
Patient complaining of lower abdominal pain and sob. Patient states that the lower abdominal pain started last Friday. The sob started this mornining

## 2020-09-04 NOTE — ED Provider Notes (Addendum)
[facility] DEPT Provider Note   CSN: [id number] Arrival date & time: 09/04/20  9381     History Chief Complaint  Patient presents with   Abdominal Pain    Tracy Goodman is a 65 y.o. female with a history of morbid obesity, hypertension, hyperlipidemia, cesarean section x1.  This was a chief complaint of lower abdominal pain.  Patient reports that her pain began on 08/30/2020.  Pain has progressively worsened since then.  Patient pain is intermittent and she only experiences it when sitting upright.  Patient's pain is completely resolved when laying in supine position.  Pain is worse with bowel movements or passing flatus.  Patient states that when she has pain it is 8/10 on the pain scale.  Patient describes her pain as a "pressure."  Patient denies any radiation of her pain.  Patient states that on Saturday she noticed a bulging around her umbilicus as well as pain.  Denies any history of strenuous activity or heavy lifting. Patient denies any nausea or vomiting, blood in stool, melena, constipation or diarrhea.  Patient reports that her last bowel movement was yesterday.  Patient reports that she was able to pass flatus this morning.  Patient denies any fevers, chills, dysuria, urinary frequency, hematuria, vaginal bleeding, vaginal pain, vaginal discharge.  G3 P3-0-0-3.  Cesarean section x1.  HPI     History reviewed. No pertinent past medical history.  Patient Active Problem List   Diagnosis Date Noted   Enlarged thyroid gland 03/12/2020   Hyperlipidemia 07/13/2016   Hypertension 06/10/2016   Morbid obesity ([place]) 06/10/2016    Past Surgical History:  Procedure Laterality Date   CESAREAN SECTION     WRIST FRACTURE SURGERY Left      OB History   No obstetric history on file.     Family History  Problem Relation Age of Onset   COPD Mother    Heart failure Mother     Social History   Tobacco Use   Smoking status: Never  Smoker   Smokeless tobacco: Never Used  Substance Use Topics   Alcohol use: Not Currently   Drug use: Never    Home Medications Prior to Admission medications   Medication Sig Start Date End Date Taking? Authorizing Provider  Naproxen Sod-diphenhydrAMINE (ALEVE PM PO) Take 220 mg by mouth as needed. For knee pain    [provider]    Allergies    Patient has no known allergies.  Review of Systems   Review of Systems  Constitutional: Negative for chills and fever.  Eyes: Negative for visual disturbance.  Respiratory: Negative for cough and shortness of breath.   Cardiovascular: Negative for chest pain.  Gastrointestinal: Positive for abdominal pain. Negative for abdominal distention, anal bleeding, blood in stool, constipation, diarrhea, nausea, rectal pain and vomiting.  Genitourinary: Negative for decreased urine volume, difficulty urinating, dysuria, flank pain, hematuria, vaginal bleeding, vaginal discharge and vaginal pain.  Musculoskeletal: Negative for back pain and neck pain.  Skin: Negative for color change and rash.  Neurological: Negative for dizziness, syncope, light-headedness and headaches.  Psychiatric/Behavioral: Negative for confusion.    Physical Exam Updated Vital Signs BP (!) 157/70    Pulse 60    Temp 97.6 F (36.4 C) (Oral)    Resp 19    Ht _0  (1.499 m)    Wt 113.4 kg    SpO2 100%    BMI 50.49 kg/m   Physical Exam Vitals and nursing note reviewed. Exam  conducted with a chaperone present (Female nurse tech present).  Constitutional:      General: She is not in acute distress.    Appearance: She is morbidly obese. She is not ill-appearing, toxic-appearing or diaphoretic.  HENT:     Head: Normocephalic.  Eyes:     General: No scleral icterus.       Right eye: No discharge.        Left eye: No discharge.  Cardiovascular:     Rate and Rhythm: Normal rate.     Heart sounds: Normal heart sounds.  Pulmonary:     Effort: Pulmonary effort  is normal. No tachypnea, bradypnea or respiratory distress.     Breath sounds: Normal breath sounds. No stridor. No decreased breath sounds, wheezing, rhonchi or rales.     Comments: Able to speak in full complete sentences without difficulty Abdominal:     General: Abdomen is protuberant. There is no distension. There are no signs of injury.     Palpations: Abdomen is soft. There is no mass or pulsatile mass.     Tenderness: There is abdominal tenderness in the right lower quadrant, periumbilical area and left lower quadrant. There is no right CVA tenderness, left CVA tenderness, guarding or rebound. Negative signs include McBurney's sign and psoas sign.     Hernia: There is no hernia in the umbilical area or ventral area.     Comments: Physical exam difficult due to patient's body habitus No umbilical or ventral hernia was appreciated  Genitourinary:    Exam position: Lithotomy position.     Pubic Area: No rash or pubic lice.      Tanner stage (genital): 5.     Labia:        Right: No rash, tenderness, lesion or injury.        Left: No rash, tenderness, lesion or injury.      Urethra: No prolapse, urethral pain, urethral swelling or urethral lesion.     Vagina: No signs of injury and foreign body. Vaginal discharge present. No erythema, tenderness, bleeding, lesions or prolapsed vaginal walls.     Cervix: No cervical motion tenderness, discharge, friability, lesion, erythema, cervical bleeding or eversion.     Uterus: Not enlarged, not tender and no uterine prolapse.      Adnexa: Right adnexa normal and left adnexa normal.     Comments: Scant grayish-white discharge noted in vaginal vault Musculoskeletal:     Cervical back: Neck supple.     Right lower leg: No edema.     Left lower leg: No edema.  Skin:    General: Skin is warm and dry.  Neurological:     General: No focal deficit present.     Mental Status: She is alert.     GCS: GCS eye subscore is 4. GCS verbal subscore is 5. GCS  motor subscore is 6.  Psychiatric:        Behavior: Behavior is cooperative.     ED Results / Procedures / Treatments   Labs (all labs ordered are listed, but only abnormal results are displayed) Labs Reviewed  WET PREP, GENITAL - Abnormal; Notable for the following components:      Result Value   Clue Cells Wet Prep HPF POC PRESENT (*)    WBC, Wet Prep HPF POC MODERATE (*)    All other components within normal limits  COMPREHENSIVE METABOLIC PANEL - Abnormal; Notable for the following components:   Glucose, Bld 102 (*)    Albumin  3.4 (*)    All other components within normal limits  URINALYSIS, ROUTINE W REFLEX MICROSCOPIC - Abnormal; Notable for the following components:   APPearance CLOUDY (*)    Hgb urine dipstick SMALL (*)    Ketones, ur 5 (*)    Leukocytes,Ua SMALL (*)    Bacteria, UA RARE (*)    All other components within normal limits  URINE CULTURE  LIPASE, BLOOD  CBC  GC/CHLAMYDIA PROBE AMP ([place]) NOT AT [facility]    EKG EKG Interpretation  Date/Time:  Wednesday September 04 2020 06:26:33 EDT Ventricular Rate:  77 PR Interval:    QRS Duration: 86 QT Interval:  378 QTC Calculation: 428 R Axis:   35 Text Interpretation: Sinus rhythm Normal ECG Confirmed by Orpah Greek [phone]) on 09/04/2020 6:50:58 AM   Radiology DG Chest 2 View  Result Date: 09/04/2020 CLINICAL DATA:  Shortness of breath EXAM: CHEST - 2 VIEW COMPARISON:  None available FINDINGS: Artifact from EKG leads. Normal heart size and mediastinal contours. No acute infiltrate or edema. No effusion or pneumothorax. No acute osseous findings. IMPRESSION: No evidence of active disease. Electronically Signed   By: Monte Fantasia M.D.   On: 09/04/2020 07:34   US Pelvis Complete  Result Date: 09/04/2020 CLINICAL DATA:  Pelvic inflammation noted on CT examination EXAM: TRANSABDOMINAL ULTRASOUND OF PELVIS TECHNIQUE: Transabdominal ultrasound examination of the pelvis was performed including  evaluation of the uterus, ovaries, adnexal regions, and pelvic cul-de-sac. COMPARISON:  None. CT abdomen and pelvis September 04, 2020 FINDINGS: Uterus Measurements: 10.8 x 5.4 x 7.5 cm = volume: 230.4 mL. No fibroids or other mass visualized. Endometrium Thickness: 5 mm.  No focal abnormality visualized. Right ovary Measurements: 3.4 x 2.4 x 2.8 cm = volume: 11.6 mL. Normal appearance/no adnexal mass. Left ovary Measurements: 4.0 x 2.1 x 2.6 cm = volume: 11.8 mL. Normal appearance/no adnexal mass. Other findings:  No abnormal free fluid. IMPRESSION: No abnormality appreciable by ultrasound. Electronically Signed   By: Lowella Grip III M.D.   On: 09/04/2020 11:04   CT Abdomen Pelvis W Contrast  Result Date: 09/04/2020 CLINICAL DATA:  Abdominal pain EXAM: CT ABDOMEN AND PELVIS WITH CONTRAST TECHNIQUE: Multidetector CT imaging of the abdomen and pelvis was performed using the standard protocol following bolus administration of intravenous contrast. CONTRAST:  141m OMNIPAQUE IOHEXOL 300 MG/ML  SOLN COMPARISON:  None. FINDINGS: Lower chest: No acute abnormality. Normal size heart. No pericardial effusion. Small hiatal hernia. Hepatobiliary: No focal liver abnormality is seen. No gallstones, gallbladder wall thickening, or biliary dilatation. Pancreas: Unremarkable. No pancreatic ductal dilatation or surrounding inflammatory changes. Spleen: Normal in size without focal abnormality. Adrenals/Urinary Tract: Adrenal glands are unremarkable. Kidneys are normal, without renal calculi, focal lesion, or hydronephrosis. There is mild bladder wall thickening with perivesicular stranding. Stomach/Bowel: Small hiatal hernia otherwise the stomach is unremarkable for degree of distension. No suspicious small bowel wall thickening or dilation. A nondilated appendix appears be located within the right lower quadrant on image 63/2. Terminal ileum appears unremarkable. There is extensive colonic diverticulosis with a short segment  of mild sigmoid colonic wall thickening in the left hemipelvis and fat stranding throughout the pelvis with trace pelvic free fluid and fluid layering along the left pericolic gutter. Vascular/Lymphatic: No abdominal aortic aneurysm. No pathologically enlarged abdominal or pelvic lymph nodes. However, there are prominent right greater than left iliac and pelvic sidewall lymph nodes for instance there is a 9 mm right external iliac lymph node on image 63/2  and an 8 mm right pelvic sidewall lymph node image 66/2. Reproductive: Gas in the vagina with apparent irregular thickening and enhancement of the vagina for instance on image 77/2. Other: Small fat containing umbilical hernia and bilateral inguinal hernias. Musculoskeletal: Thoracic diffuse idiopathic skeletal hyperostosis. Lumbar degenerative disease. No acute osseous abnormality. IMPRESSION: 1. Gas in the vagina with apparent irregular thickening and enhancement as well as inflammatory stranding throughout the pelvis, which may represent pelvic inflammatory disease, recommend clinical correlation and pelvic ultrasound for further evaluation. 2. Colonic diverticulosis with a short segment mild sigmoid colonic wall thickening in the left hemipelvis with inflammatory stranding throughout the pelvis, findings which are favored reactive and related to under distension however diverticulitis/short-segment colitis not entirely excluded on imaging. 3. Mild bladder wall thickening with perivesicular stranding may be reactive or represent cystitis. Correlation with urinalysis is recommended. 4. Prominent right greater than left iliac and pelvic sidewall lymph nodes, likely reactive. 5. Small fat containing umbilical and bilateral inguinal hernias. Electronically Signed   By: Dahlia Bailiff MD   On: 09/04/2020 10:08    Procedures Procedures   Medications Ordered in ED Medications  iohexol (OMNIPAQUE) 300 MG/ML solution 100 mL (100 mLs Intravenous Contrast Given  09/04/20 0913)    ED Course  I have reviewed the triage vital signs and the nursing notes.  Pertinent labs & imaging results that were available during my care of the patient were reviewed by me and considered in my medical decision making (see chart for details).    MDM Rules/Calculators/A&P                          Alert 65 year old female no acute distress, nontoxic-appearing.  Patient presents with chief complaint of lower abdominal pain progressively worsening over the weekend.  Patient reports she only experiences pain when sitting upright.  Also reports that she developed a bulge in her umbilicus over this weekend that was tender to palpation.  Denies any strenuous activity or heavy lifting.  Patient denies any nausea or vomiting, blood in stool, melena, constipation or diarrhea.  Patient reports that her last bowel movement was yesterday.  Patient reports that she was able to pass flatus this morning.  History of C-section x1.  Physical exam is difficult due to patient's morbid obesity.  Normoactive bowel sounds, abdomen soft, nondistended, tenderness to left lower quadrant, right lower quadrant, and periumbilical.  No ventral or umbilical hernia appreciated.  CVA tenderness negative.  Negative psoas sign.  Tenderness over McBurney's point.  Low suspicion for hepatobiliary disease as AST, ALT, alk phos, total bili all within normal limits, no right upper quadrant pain and pain is not changed after eating. Low suspicion for acute pancreatitis as lipase within normal limits and no complaints of epigastric pain or upper quadrant pain. Low suspicion for bowel obstruction as patient passing flatus and able to have normal bowel movement yesterday. CBC is unremarkable X-ray shows no acute cardiopulmonary disease. EKG normal sinus rhythm.  UA pending.  We will obtain contrast CT scan of abdomen pelvis to evaluate for diverticulitis, appendicitis, as well as hernia. CT scan shows: 1) gas in  the vagina with apparent irregular thickening and enhancement as well as inflammatory stranding throughout the pelvis, which may represent PID, recommended clinical correlation and pelvic ultrasound for further evaluation. 2) colonic diverticulosis with short segment mild sigmoid colonic wall thickening in the left hemipelvis with inflammatory stranding throughout the pelvis, findings which are favored reactive and  related to under distention however diverticulitis/short segment colitis not entirely excluded on imaging 3) mild bladder wall thickening with perivascular stranding may be reactive or represent cystitis correlation with urinalysis is recommended 4) prominent right greater than left iliac and pelvic sidewall lymph nodes, likely reactive 5) small fat-containing umbilical and bilateral inguinal hernias.  Urinalysis shows bacteria rare, WBC 11-20, leukocyte small, nitrate negative, squamous epithelial 21-50.  Low suspicion for cystitis as patient has no urinary frequency, dysuria, decreased urinary output.  We will send urine for culture.  Pelvic ultrasound ordered.  Pelvic ultrasound showed no abnormality appreciable by ultrasound.  Pelvic exam was performed with female nurse tech present in room.  Patient has no cervical motion tenderness, minimal grayish-white discharge noted in vaginal vault.  Gonorrhea and Chlamydia testing obtained.  Wet prep obtained.  Wet prep shows clue cells present.  Will treat patient for bacterial vaginosis with Flagyl. Discussed with patient that gonorrhea and Chlamydia results will not be resulted for 24 to 72 hours.  Shared decision making with patient to defer treatment until results are pended as she has a low suspicion for STI infection due to lack of symptoms as well as being in any mutually monogamous relationship with her husband.  On serial reexamination patient's abdomen remained soft, nondistended, nontender.  Patient remained hemodynamically stable.   Patient was noted to be hypertensive however reports she has not taken her hypertension medication.    We will treat patient for diverticulitis with prescription of Augmentin.  We will have patient follow-up with her primary care provider as well as the [facility].  Discussed follow-up with general surgery for patient's hernias, patient will discuss this matter further with her primary care provider.  Discussed results, findings, treatment and follow up. Patient advised of return precautions. Patient verbalized understanding and agreed with plan.  Patient care and treatment were discussed with Dr. Gilford Raid.  Final Clinical Impression(s) / ED Diagnoses Final diagnoses:  Diverticulitis  Bacterial vaginosis    Rx / DC Orders ED Discharge Orders         Ordered    amoxicillin-clavulanate (AUGMENTIN) 875-125 MG tablet  Every 12 hours        09/04/20 1233    metroNIDAZOLE (FLAGYL) 500 MG tablet  2 times daily        09/04/20 1233           Loni Beckwith, PA-C 09/04/20 1828    Loni Beckwith, PA-C 09/04/20 1831    Isla Pence, MD 09/05/20 [phone]

## 2020-09-05 ENCOUNTER — Encounter: Payer: Self-pay | Admitting: Family Medicine

## 2020-09-05 ENCOUNTER — Ambulatory Visit: Payer: BC Managed Care – PPO | Admitting: Family Medicine

## 2020-09-05 ENCOUNTER — Other Ambulatory Visit: Payer: Self-pay

## 2020-09-05 VITALS — BP 126/74 | HR 90 | Temp 96.9°F | Wt 242.2 lb

## 2020-09-05 DIAGNOSIS — K429 Umbilical hernia without obstruction or gangrene: Secondary | ICD-10-CM | POA: Insufficient documentation

## 2020-09-05 DIAGNOSIS — K5792 Diverticulitis of intestine, part unspecified, without perforation or abscess without bleeding: Secondary | ICD-10-CM | POA: Diagnosis not present

## 2020-09-05 DIAGNOSIS — I10 Essential (primary) hypertension: Secondary | ICD-10-CM

## 2020-09-05 DIAGNOSIS — E782 Mixed hyperlipidemia: Secondary | ICD-10-CM

## 2020-09-05 LAB — GC/CHLAMYDIA PROBE AMP (~~LOC~~) NOT AT ARMC
Chlamydia: NEGATIVE
Comment: NEGATIVE
Comment: NORMAL
Neisseria Gonorrhea: NEGATIVE

## 2020-09-05 NOTE — Assessment & Plan Note (Signed)
Working on weight loss. Recheck in 1 month with lab appt

## 2020-09-05 NOTE — Progress Notes (Signed)
Subjective:     Tracy Goodman is a 65 y.o. female presenting for ER Follow Up (/)     HPI  #Diverticulitis - pain was severe and is improving on abx - also treated for BV  #HTN - has been making changes in her diet due to her high blood pressure - eating smoothies - eating fish and chicken - less ground beef  Review of Systems  09/04/2020: ER - abdominal pain - CT with diverticulitis, gas in vaginal area. Treated with flagyl for BV and augmentin for diverticulitis  Social History   Tobacco Use  Smoking Status Never Smoker  Smokeless Tobacco Never Used        Objective:    BP Readings from Last 3 Encounters:  09/05/20 126/74  09/04/20 (!) 171/103  06/25/20 (!) 144/88   Wt Readings from Last 3 Encounters:  09/05/20 242 lb 4 oz (109.9 kg)  09/04/20 250 lb (113.4 kg)  06/25/20 247 lb 12 oz (112.4 kg)    BP 126/74 (BP Location: Left Arm, Patient Position: Sitting, Cuff Size: Large)   Pulse 90   Temp (!) 96.9 F (36.1 C) (Temporal)   Wt 242 lb 4 oz (109.9 kg)   SpO2 100%   BMI 48.93 kg/m    Physical Exam Constitutional:      General: She is not in acute distress.    Appearance: She is well-developed. She is not diaphoretic.  HENT:     Right Ear: External ear normal.     Left Ear: External ear normal.     Nose: Nose normal.  Eyes:     Conjunctiva/sclera: Conjunctivae normal.  Cardiovascular:     Rate and Rhythm: Normal rate and regular rhythm.     Heart sounds: No murmur heard.   Pulmonary:     Effort: Pulmonary effort is normal. No respiratory distress.     Breath sounds: Normal breath sounds. No wheezing.  Abdominal:     General: Abdomen is flat. Bowel sounds are normal. There is distension.     Palpations: Abdomen is soft.     Tenderness: There is abdominal tenderness (LLQ and umbilical). There is no guarding or rebound.  Musculoskeletal:     Cervical back: Neck supple.  Skin:    General: Skin is warm and dry.     Capillary Refill:  Capillary refill takes less than 2 seconds.  Neurological:     Mental Status: She is alert. Mental status is at baseline.  Psychiatric:        Mood and Affect: Mood normal.        Behavior: Behavior normal.     The 10-year ASCVD risk score Mikey Bussing DC Jr., et al., 2013) is: 7.1%   Values used to calculate the score:     Age: 68 years     Sex: Female     Is Non-Hispanic African American: Yes     Diabetic: No     Tobacco smoker: No     Systolic Blood Pressure: 619 mmHg     Is BP treated: No     HDL Cholesterol: 50.5 mg/dL     Total Cholesterol: 216 mg/dL       Assessment & Plan:   Problem List Items Addressed This Visit      Cardiovascular and Mediastinum   Hypertension    BP improved. Continue lifestyle changes        Other   Hyperlipidemia - Primary    Working on weight loss. Recheck in  1 month with lab appt      Relevant Orders   Lipid panel   Umbilical hernia    Asymptomatic. Noted on CT scan. Discussed red flag signs. She will call if becoming symptomatic. Encouraged weight loss.        Other Visit Diagnoses    Diverticulitis         Reviewed ER - symptoms improved with flagyl and augmentin. Discussed completing abx if and if persisting to call.    Return if symptoms worsen or fail to improve.  [name], MD  This visit occurred during the SARS-CoV-2 public health emergency.  Safety protocols were in place, including screening questions prior to the visit, additional usage of staff PPE, and extensive cleaning of exam room while observing appropriate contact time as indicated for disinfecting solutions.

## 2020-09-05 NOTE — Patient Instructions (Addendum)
Return 1 month to check cholesterol  Return if symptoms do not resolve

## 2020-09-05 NOTE — Assessment & Plan Note (Signed)
BP improved. Continue lifestyle changes

## 2020-09-05 NOTE — Assessment & Plan Note (Signed)
Asymptomatic. Noted on CT scan. Discussed red flag signs. She will call if becoming symptomatic. Encouraged weight loss.

## 2020-09-06 LAB — URINE CULTURE

## 2020-09-13 ENCOUNTER — Telehealth: Payer: Self-pay

## 2020-09-13 NOTE — Telephone Encounter (Signed)
Patient called and stated that she was in the shower last night and noticed a dime sized lump on the side of her R breast that is slightly tender. Patient denied redness, swelling, or any abnormal discharge. Mammogram completed on 07/18/20 was normal. Patient stated that she was ok waiting until her appointment on 4/4 with Dr. Einar Pheasant, but just wanted to make her aware. Instructed patient that if lump became bigger, more painful, reddened, increased swelling and nipple discharge before then to call back. Patient verbalized understanding.

## 2020-09-13 NOTE — Telephone Encounter (Signed)
Agree with plan to call or come in sooner if worsening or increasing in size.

## 2020-09-23 ENCOUNTER — Encounter: Payer: Self-pay | Admitting: Family Medicine

## 2020-09-23 ENCOUNTER — Other Ambulatory Visit: Payer: Self-pay

## 2020-09-23 ENCOUNTER — Ambulatory Visit: Payer: BC Managed Care – PPO | Admitting: Family Medicine

## 2020-09-23 VITALS — BP 130/78 | HR 107 | Temp 97.7°F | Ht 60.0 in | Wt 240.0 lb

## 2020-09-23 DIAGNOSIS — E049 Nontoxic goiter, unspecified: Secondary | ICD-10-CM | POA: Diagnosis not present

## 2020-09-23 DIAGNOSIS — K5792 Diverticulitis of intestine, part unspecified, without perforation or abscess without bleeding: Secondary | ICD-10-CM | POA: Diagnosis not present

## 2020-09-23 DIAGNOSIS — E782 Mixed hyperlipidemia: Secondary | ICD-10-CM

## 2020-09-23 DIAGNOSIS — N6311 Unspecified lump in the right breast, upper outer quadrant: Secondary | ICD-10-CM | POA: Insufficient documentation

## 2020-09-23 LAB — LIPID PANEL
Cholesterol: 205 mg/dL — ABNORMAL HIGH (ref 0–200)
HDL: 46.5 mg/dL (ref >39.00)
LDL Cholesterol: 132 mg/dL — ABNORMAL HIGH (ref 0–99)
NonHDL: 158.23
Total CHOL/HDL Ratio: 4
Triglycerides: 129 mg/dL (ref 0.0–149.0)
VLDL: 25.8 mg/dL (ref 0.0–40.0)

## 2020-09-23 MED ORDER — AMOXICILLIN-POT CLAVULANATE 875-125 MG PO TABS: 1.0000 | ORAL_TABLET | Freq: Two times a day (BID) | ORAL | 0 refills | Status: DC

## 2020-09-23 NOTE — Assessment & Plan Note (Signed)
Pt unable to get imaging due to cost. Encouraged getting in the future. If lymph node increases in size will readdress imaging.

## 2020-09-23 NOTE — Progress Notes (Signed)
Subjective:     Tracy Goodman is a 65 y.o. female presenting for Breast Mass (Right breast x 1 week right neck x 3 days )     HPI  #Breast Mass - felt a lump in the right breast - seems to have moved down - was tender - but no longer sore - noticed this 1 week ago  Lump on the left neck - sore and was large but decreasing in size - started on Saturday - had pulse sensation to it  Notes some side pain Difficulty passing gas No constipation Notes urinary frequency w/o dysuria    Review of Systems  Constitutional: Negative for chills and fever.  Gastrointestinal:       Difficulty passing gas     Social History   Tobacco Use  Smoking Status Never Smoker  Smokeless Tobacco Never Used        Objective:    BP Readings from Last 3 Encounters:  09/23/20 130/78  09/05/20 126/74  09/04/20 (!) 171/103   Wt Readings from Last 3 Encounters:  09/23/20 240 lb (108.9 kg)  09/05/20 242 lb 4 oz (109.9 kg)  09/04/20 250 lb (113.4 kg)    BP 130/78   Pulse (!) 107   Temp 97.7 F (36.5 C) (Temporal)   Ht 5' (1.524 m)   Wt 240 lb (108.9 kg)   SpO2 96%   BMI 46.87 kg/m    Physical Exam Exam conducted with a chaperone present.  Constitutional:      General: She is not in acute distress.    Appearance: She is well-developed. She is not diaphoretic.  HENT:     Right Ear: External ear normal.     Left Ear: External ear normal.     Nose: Nose normal.  Eyes:     Conjunctiva/sclera: Conjunctivae normal.  Neck:     Thyroid: Thyromegaly present.  Cardiovascular:     Rate and Rhythm: Normal rate.  Pulmonary:     Effort: Pulmonary effort is normal.  Chest:     Chest wall: Mass (firm, non-mobile, pea-sized mass in the right breast in the 10 O'clock position) present. No tenderness.  Breasts:     Right: No nipple discharge or skin change.     Left: Supraclavicular adenopathy (pea sized lymph node above the left clavical ) present.    Musculoskeletal:      Cervical back: Neck supple.  Lymphadenopathy:     Upper Body:     Left upper body: Supraclavicular adenopathy (pea sized lymph node above the left clavical ) present.  Skin:    General: Skin is warm and dry.     Capillary Refill: Capillary refill takes less than 2 seconds.  Neurological:     Mental Status: She is alert. Mental status is at baseline.  Psychiatric:        Mood and Affect: Mood normal.        Behavior: Behavior normal.           Assessment & Plan:   Problem List Items Addressed This Visit      Endocrine   Enlarged thyroid gland    Pt unable to get imaging due to cost. Encouraged getting in the future. If lymph node increases in size will readdress imaging.         Other   Hyperlipidemia    Weight down 10 lbs. Will recheck lipids      Breast lump on right side at 10 o'clock position -  Primary    Recent mammogram was normal but new firm lesion noted x 1 week. Discussed concern and imaging ordered for further evaluation      Relevant Orders   MM DIAG BREAST TOMO UNI RIGHT   US BREAST LTD UNI RIGHT INC AXILLA   Diverticulitis    Suspect abdominal pain and difficulty passing gas related to possible diverticulitis. Start abx, return if not improving.       Relevant Medications   amoxicillin-clavulanate (AUGMENTIN) 875-125 MG tablet       Return if symptoms worsen or fail to improve.  [name], MD  This visit occurred during the SARS-CoV-2 public health emergency.  Safety protocols were in place, including screening questions prior to the visit, additional usage of staff PPE, and extensive cleaning of exam room while observing appropriate contact time as indicated for disinfecting solutions.

## 2020-09-23 NOTE — Assessment & Plan Note (Signed)
Weight down 10 lbs. Will recheck lipids

## 2020-09-23 NOTE — Addendum Note (Signed)
Addended by: Waunita Schooner R on: 09/23/2020 12:25 PM   Modules accepted: Orders

## 2020-09-23 NOTE — Assessment & Plan Note (Signed)
Recent mammogram was normal but new firm lesion noted x 1 week. Discussed concern and imaging ordered for further evaluation

## 2020-09-23 NOTE — Assessment & Plan Note (Signed)
Suspect abdominal pain and difficulty passing gas related to possible diverticulitis. Start abx, return if not improving.

## 2020-09-23 NOTE — Patient Instructions (Addendum)
#  Abdominal pain - Try the antibiotic and update me if not improving in a few days   #Breast - imaging ordered to check on the lesion   #Neck - I think this is a lymph node, but I do think you should try and get the neck ultrasound done - if growing in size let me know

## 2020-09-24 ENCOUNTER — Other Ambulatory Visit: Payer: Self-pay | Admitting: Family Medicine

## 2020-09-24 DIAGNOSIS — E782 Mixed hyperlipidemia: Secondary | ICD-10-CM

## 2020-09-24 MED ORDER — ATORVASTATIN CALCIUM 10 MG PO TABS: 10.0000 mg | ORAL_TABLET | Freq: Every day | ORAL | 3 refills | Status: DC

## 2020-09-24 NOTE — Progress Notes (Signed)
Sending in new atorvastatin prescription

## 2020-09-30 ENCOUNTER — Encounter (HOSPITAL_COMMUNITY): Payer: Self-pay

## 2020-09-30 ENCOUNTER — Emergency Department (HOSPITAL_COMMUNITY): Payer: BC Managed Care – PPO

## 2020-09-30 ENCOUNTER — Emergency Department (HOSPITAL_COMMUNITY)
Admission: EM | Admit: 2020-09-30 | Discharge: 2020-09-30 | Disposition: A | Discharge status: Home / Self Care | Payer: BC Managed Care – PPO | Attending: Emergency Medicine | Admitting: Emergency Medicine

## 2020-09-30 ENCOUNTER — Other Ambulatory Visit: Payer: Self-pay

## 2020-09-30 DIAGNOSIS — C786 Secondary malignant neoplasm of retroperitoneum and peritoneum: Secondary | ICD-10-CM

## 2020-09-30 DIAGNOSIS — K625 Hemorrhage of anus and rectum: Secondary | ICD-10-CM | POA: Diagnosis not present

## 2020-09-30 DIAGNOSIS — R109 Unspecified abdominal pain: Secondary | ICD-10-CM | POA: Insufficient documentation

## 2020-09-30 HISTORY — DX: Diverticulitis of intestine, part unspecified, without perforation or abscess without bleeding: K57.92

## 2020-09-30 HISTORY — DX: Essential (primary) hypertension: I10

## 2020-09-30 LAB — CBC
HCT: 41 % (ref 36.0–46.0)
Hemoglobin: 12.6 g/dL (ref 12.0–15.0)
MCH: 26.9 pg (ref 26.0–34.0)
MCHC: 30.7 g/dL (ref 30.0–36.0)
MCV: 87.6 fL (ref 80.0–100.0)
Platelets: 344 10*3/uL (ref 150–400)
RBC: 4.68 MIL/uL (ref 3.87–5.11)
RDW: 13.2 % (ref 11.5–15.5)
WBC: 6.5 10*3/uL (ref 4.0–10.5)
nRBC: 0 % (ref 0.0–0.2)

## 2020-09-30 LAB — COMPREHENSIVE METABOLIC PANEL
ALT: 10 U/L (ref 0–44)
AST: 13 U/L — ABNORMAL LOW (ref 15–41)
Albumin: 3.4 g/dL — ABNORMAL LOW (ref 3.5–5.0)
Alkaline Phosphatase: 64 U/L (ref 38–126)
Anion gap: 10 (ref 5–15)
BUN: 10 mg/dL (ref 8–23)
CO2: 21 mmol/L — ABNORMAL LOW (ref 22–32)
Calcium: 9.1 mg/dL (ref 8.9–10.3)
Chloride: 108 mmol/L (ref 98–111)
Creatinine, Ser: 1.97 mg/dL — ABNORMAL HIGH (ref 0.44–1.00)
GFR, Estimated: 28 mL/min — ABNORMAL LOW (ref >60)
Glucose, Bld: 100 mg/dL — ABNORMAL HIGH (ref 70–99)
Potassium: 3.6 mmol/L (ref 3.5–5.1)
Sodium: 139 mmol/L (ref 135–145)
Total Bilirubin: 0.7 mg/dL (ref 0.3–1.2)
Total Protein: 7.3 g/dL (ref 6.5–8.1)

## 2020-09-30 LAB — URINALYSIS, ROUTINE W REFLEX MICROSCOPIC
Bilirubin Urine: NEGATIVE
Glucose, UA: NEGATIVE mg/dL
Ketones, ur: 5 mg/dL — AB
Leukocytes,Ua: NEGATIVE
Nitrite: NEGATIVE
Protein, ur: NEGATIVE mg/dL
Specific Gravity, Urine: 1.009 (ref 1.005–1.030)
pH: 5 (ref 5.0–8.0)

## 2020-09-30 LAB — LIPASE, BLOOD: Lipase: 26 U/L (ref 11–51)

## 2020-09-30 MED ORDER — LACTATED RINGERS IV BOLUS
1000.0000 mL | Freq: Once | INTRAVENOUS | Status: AC
  Administered 2020-09-30: 1000 mL via INTRAVENOUS

## 2020-09-30 MED ORDER — IOHEXOL 300 MG/ML  SOLN
75.0000 mL | Freq: Once | INTRAMUSCULAR | Status: AC | PRN
  Administered 2020-09-30: 75 mL via INTRAVENOUS

## 2020-09-30 MED ORDER — IOHEXOL 9 MG/ML PO SOLN: 2000.0000 mL | ORAL | Status: AC

## 2020-09-30 MED ORDER — IOHEXOL 9 MG/ML PO SOLN
ORAL | Status: AC
  Filled 2020-09-30: qty 1500

## 2020-09-30 MED ORDER — OXYCODONE HCL 5 MG PO TABS: 5.0000 mg | ORAL_TABLET | Freq: Four times a day (QID) | ORAL | 0 refills | Status: AC | PRN

## 2020-09-30 MED ORDER — IOHEXOL 9 MG/ML PO SOLN
ORAL | Status: AC
  Administered 2020-09-30: 2000 mL via ORAL
  Filled 2020-09-30: qty 500

## 2020-09-30 MED ORDER — FENTANYL CITRATE (PF) 100 MCG/2ML IJ SOLN
100.0000 ug | Freq: Once | INTRAMUSCULAR | Status: AC
  Administered 2020-09-30: 100 ug via INTRAVENOUS
  Filled 2020-09-30: qty 2

## 2020-09-30 MED ORDER — ONDANSETRON HCL 4 MG/2ML IJ SOLN
4.0000 mg | Freq: Once | INTRAMUSCULAR | Status: AC
  Administered 2020-09-30: 4 mg via INTRAVENOUS
  Filled 2020-09-30: qty 2

## 2020-09-30 NOTE — ED Triage Notes (Signed)
Pt reports lower abdominal pain worse on the left side. Pt also sts recent dx of diverticulitis. Took full course of abx. 2 episodes of bright red blood in stool yesterday.

## 2020-09-30 NOTE — Discharge Instructions (Signed)
Do not drive while taking oxycodone. Make sure to use a stool softener while taking oxycodone.

## 2020-09-30 NOTE — ED Provider Notes (Signed)
  Physical Exam  BP (!) 171/86   Pulse (!) 54   Temp 98 F (36.7 C) (Oral)   Resp 18   Ht 4\' 11"  (1.499 m)   Wt 108.9 kg   SpO2 93%   BMI 48.47 kg/m   Physical Exam  ED Course/Procedures   Clinical Course as of 09/30/20 1225  Mon Sep 30, 2020  0300 Creatinine(!): 1.97 Dehydrated? Will give some fluids and antiemetics. Per institutional protocol, will not do IV contrasted CT.  [JM]  0300 Lipase: 26 [JM]  0300 WBC: 6.5 [JM]    Clinical Course User Index [JM] Mesner, Corene Cornea, MD    Procedures  MDM  Awaiting repeat CT scan with oral contrast and IV contrast. Oral contrast dose increased, IV contrast decreased due to AKI.  I spoke with the patient and explained the risk and benefits of using IV contrast.  She agrees to proceed with IV contrast despite the risk of contrast-induced nephropathy.  She understands that her IV dose is being modified and that she has been given IV fluids.  She also understands that contrast-induced nephropathy is likely a smaller risk to her than failure to diagnose a possible fistula.  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  She has evidence of peritoneal involvement, and her cervix is a possible source.  She was advised to follow-up with GYN, and she was given opioids for pain control.  We talked about side effects, and she was advised about appropriate use of opioids.  She was given return precautions.    Arnaldo Natal, MD 09/30/20 1225

## 2020-09-30 NOTE — ED Provider Notes (Signed)
Emergency Department Provider Note  I have reviewed the triage vital signs and the nursing notes.  HISTORY  Chief Complaint Abdominal Pain   HPI Tracy Goodman is a 65 y.o. female who presents to the ED with persistent L sided pain after dx of diverticulitis a month ago. Was on amox/flagyl and siwtched to augmentin a couple days ago as symptoms werent improving. She has had 2 episodes of hematochezia in the last couple days when she thought she had gas. Has had decreased appetite and some mild nausea. No fevers. Unsure on last colonoscopy. No anticoagulants. persisent l sided and epigastric abdominal pain.   No other associated or modifying symptoms.    Past Medical History:  Diagnosis Date  . Diverticulitis     Patient Active Problem List   Diagnosis Date Noted  . Breast lump on right side at 10 o'clock position 09/23/2020  . Diverticulitis 09/23/2020  . Umbilical hernia 45/36/4680  . Enlarged thyroid gland 03/12/2020  . Hyperlipidemia 07/13/2016  . Hypertension 06/10/2016  . Morbid obesity ([place]) 06/10/2016    Past Surgical History:  Procedure Laterality Date  . CESAREAN SECTION    . WRIST FRACTURE SURGERY Left     Current Outpatient Rx  . Order #: [id number] Class: Normal  . Order #: [id number] Class: Normal  . Order #: [id number] Class: Historical Med  . Order #: [id number] Class: Historical Med  . Order #: [id number] Class: Historical Med    Allergies Patient has no known allergies.  Family History  Problem Relation Age of Onset  . COPD Mother   . Heart failure Mother     Social History Social History   Tobacco Use  . Smoking status: Never Smoker  . Smokeless tobacco: Never Used  Substance Use Topics  . Alcohol use: Not Currently  . Drug use: Never    Review of Systems  All other systems negative except as documented in the HPI. All pertinent positives and negatives as reviewed in the HPI. ____________________________________________  PHYSICAL  EXAM:  VITAL SIGNS: ED Triage Vitals  Enc Vitals Group     BP 09/30/20 0017 (!) 166/97     Pulse Rate 09/30/20 0017 81     Resp 09/30/20 0017 18     Temp 09/30/20 0017 98 F (36.7 C)     Temp Source 09/30/20 0017 Oral     SpO2 09/30/20 0017 94 %     Weight 09/30/20 0017 240 lb (108.9 kg)     Height 09/30/20 0017 4\' 11"  (1.499 m)    Constitutional: Alert and oriented. Well appearing and in no acute distress. Eyes: Conjunctivae are normal. PERRL. EOMI. Head: Atraumatic. Nose: No congestion/rhinnorhea. Mouth/Throat: Mucous membranes are moist.  Oropharynx non-erythematous. Neck: No stridor.  No meningeal signs.   Cardiovascular: Normal rate, regular rhythm. Good peripheral circulation. Grossly normal heart sounds.   Respiratory: Normal respiratory effort.  No retractions. Lungs CTAB. Gastrointestinal: Soft and nontender. No distention.  Musculoskeletal: No lower extremity tenderness nor edema. No gross deformities of extremities. Neurologic:  Normal speech and language. No gross focal neurologic deficits are appreciated.  Skin:  Skin is warm, dry and intact. No rash noted.  ____________________________________________   LABS (all labs ordered are listed, but only abnormal results are displayed)  Labs Reviewed  COMPREHENSIVE METABOLIC PANEL - Abnormal; Notable for the following components:      Result Value   CO2 21 (*)    Glucose, Bld 100 (*)    Creatinine, Ser 1.97 (*)    Albumin 3.4 (*)  AST 13 (*)    GFR, Estimated 28 (*)    All other components within normal limits  LIPASE, BLOOD  CBC  URINALYSIS, ROUTINE W REFLEX MICROSCOPIC   ____________________________________________  EKG   EKG Interpretation  Date/Time:    Ventricular Rate:    PR Interval:    QRS Duration:   QT Interval:    QTC Calculation:   R Axis:     Text Interpretation:         ____________________________________________  RADIOLOGY  No results  found. ____________________________________________  PROCEDURES  Procedure(s) performed:   Procedures ____________________________________________  INITIAL IMPRESSION / ASSESSMENT AND PLAN    This patient presents to the ED for concern of abdominal pain, this involves an extensive number of treatment options, and is a complaint that carries with it a high risk of complications and morbidity.  The differential diagnosis includes bowel obstruction, diverticular bleed, inflammatory bleed, hemorrhoid, abscess, anal fissure.  Additional history obtained:   Additional history obtained from noone  Previous records obtained and reviewed in epic  ED Course  Clinical Course as of 09/30/20 0410  Mon Sep 30, 2020  0300 Creatinine(!): 1.97 Dehydrated? Will give some fluids and antiemetics. Per institutional protocol, will not do IV contrasted CT.  [JM]  0300 Lipase: 26 [JM]  0300 WBC: 6.5 [JM]    Clinical Course User Index [JM] Theodore Rahrig, Corene Cornea, MD     Medicines ordered:  Medications  lactated ringers bolus 1,000 mL (has no administration in time range)  fentaNYL (SUBLIMAZE) injection 100 mcg (100 mcg Intravenous Given 09/30/20 0253)  ondansetron (ZOFRAN) injection 4 mg (4 mg Intravenous Given 09/30/20 0253)     Consultations Obtained:   I consulted radiologist and discussed lab and imaging findings   CRITICAL INTERVENTIONS:  . Pain meds  Reevaluation:  After the interventions stated above, I reevaluated the patient and found improved pain. pendign ct.   CT scan with significant abnormalities however is difficult to tell without IV contrast.  After discussing with the radiologist and with the patient the plan will be to do reduced dose IV contrast along with double dose oral contrast to try to get better visualization of her colon and the abnormalities associated with it.  Care transferred pending CT scan at 0900.   Note:  This note was prepared with assistance of Dragon  voice recognition software. Occasional wrong-word or sound-a-like substitutions may have occurred due to the inherent limitations of voice recognition software.   Fender Herder, Corene Cornea, MD 10/01/20 [phone]

## 2020-10-01 ENCOUNTER — Ambulatory Visit
Admission: RE | Admit: 2020-10-01 | Discharge: 2020-10-01 | Disposition: A | Discharge status: Home / Self Care | Payer: BC Managed Care – PPO | Source: Ambulatory Visit | Attending: Family Medicine | Admitting: Family Medicine

## 2020-10-01 ENCOUNTER — Other Ambulatory Visit (HOSPITAL_COMMUNITY)
Admission: RE | Admit: 2020-10-01 | Discharge: 2020-10-01 | Disposition: A | Discharge status: Home / Self Care | Payer: BC Managed Care – PPO | Source: Ambulatory Visit | Attending: Obstetrics & Gynecology | Admitting: Obstetrics & Gynecology

## 2020-10-01 ENCOUNTER — Encounter: Payer: Self-pay | Admitting: Obstetrics & Gynecology

## 2020-10-01 ENCOUNTER — Ambulatory Visit: Payer: BC Managed Care – PPO | Admitting: Obstetrics & Gynecology

## 2020-10-01 ENCOUNTER — Telehealth: Payer: Self-pay

## 2020-10-01 VITALS — BP 130/90 | Ht 59.0 in | Wt 240.0 lb

## 2020-10-01 DIAGNOSIS — R102 Pelvic and perineal pain: Secondary | ICD-10-CM

## 2020-10-01 DIAGNOSIS — N888 Other specified noninflammatory disorders of cervix uteri: Secondary | ICD-10-CM | POA: Diagnosis not present

## 2020-10-01 DIAGNOSIS — N6311 Unspecified lump in the right breast, upper outer quadrant: Secondary | ICD-10-CM

## 2020-10-01 NOTE — Progress Notes (Signed)
Gynecology Pelvic Pain Evaluation   Chief Complaint: Pelvic pain  History of Present Illness:   Patient is a 65 y.o. G56P3 AA F who is menopausal for several years, presents for pelvic pain that began around Mar 16; she only has had vaginal bleeding noted Apr 10.  She went to ER initially with that pain, with some scans done then (she has been told she has diverticulitis) and then some more recent scans 09/29/20.  Pain is lower pelvic and suprapubic, radiation towards vagina.  Moderate, no modifiers.  No associated symptoms.  She has had prior NSVD x2 and CS x1, and later a BTL.  SHe has had no prior fibroids, cysts, endometriosis, or other GYN concerns or diagnoses.    ER and Radiology records express a concern for cervical mass or cancer.  Recent CT: IMPRESSION: 1. No intra-abdominal abscess. 2. 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 Notably, the cervix has a mass-like appearance. Further evaluation with pelvic examination is recommended to exclude gynecologic malignancy. 3. Small left pleural effusion. 4. Colonic diverticulosis without evidence of acute diverticulitis at this time.  PMHx: She  has a past medical history of Diverticulitis and Hypertension. Also,  has a past surgical history that includes Cesarean section and Wrist fracture surgery (Left)., family history includes COPD in her mother; Heart failure in her mother.,  reports that she has never smoked. She has never used smokeless tobacco. She reports previous alcohol use. She reports that she does not use drugs.  She has a current medication list which includes the following prescription(s): amoxicillin-clavulanate, atorvastatin, ibuprofen, multivitamin with minerals, and oxycodone. Also, has No Known Allergies.  Review of  Systems  Constitutional: Positive for malaise/fatigue. Negative for chills and fever.  HENT: Negative for congestion, sinus pain and sore throat.   Eyes: Negative for blurred vision and pain.  Respiratory: Negative for cough and wheezing.   Cardiovascular: Negative for chest pain and leg swelling.  Gastrointestinal: Positive for abdominal pain. Negative for constipation, diarrhea, heartburn, nausea and vomiting.  Genitourinary: Negative for dysuria, frequency, hematuria and urgency.  Musculoskeletal: Negative for back pain, joint pain, myalgias and neck pain.  Skin: Negative for itching and rash.  Neurological: Negative for dizziness, tremors and weakness.  Endo/Heme/Allergies: Does not bruise/bleed easily.  Psychiatric/Behavioral: Negative for depression. The patient is not nervous/anxious and does not have insomnia.     Objective: BP 130/90   Ht 4\' 11"  (1.499 m)   Wt 240 lb (108.9 kg)   BMI 48.47 kg/m  Physical Exam Constitutional:      General: She is not in acute distress.    Appearance: She is well-developed.  Genitourinary:     Right Labia: No rash or tenderness.    Left Labia: No tenderness or rash.    No vaginal erythema or bleeding.      Right Adnexa: not tender and no mass present.    Left Adnexa: not tender and no mass present.    Cervical lesion present.     No cervical motion tenderness, discharge, polyp or nabothian cyst.     Cervical exam comments: Firm and irreg and enlarged by palpation Difficult to visualize its entirity by speculum exam.  PAP and Biopsy (one punch biopsy) done.     Uterus is not enlarged.     No uterine mass detected.    Uterus is midaxial.  Pelvic exam was performed with patient in the lithotomy position.  HENT:     Head: Normocephalic and atraumatic.     Nose: Nose normal.  Abdominal:     General: There is no distension.     Palpations: Abdomen is soft.     Tenderness: There is no abdominal tenderness.  Musculoskeletal:         General: Normal range of motion.  Neurological:     Mental Status: She is alert and oriented to person, place, and time.     Cranial Nerves: No cranial nerve deficit.  Skin:    General: Skin is warm and dry.  Psychiatric:        Attention and Perception: Attention normal.        Mood and Affect: Mood and affect normal.        Speech: Speech normal.        Behavior: Behavior normal.        Thought Content: Thought content normal.        Judgment: Judgment normal.     Female chaperone present for pelvic portion of the physical exam  Assessment: 65 y.o. G3P3 w pelvic pain and mass and concern for etiology of cancer  1. Cervical mass - Ambulatory referral to Gynecologic Oncology - Cytology - PAP - Surgical pathology  Will call w results and line up appt soon w Gyn Onc for further planning Pt and daughter present and anxious/concerned  2. Pelvic pain Pt prefers no narcotics  (ER gave her Percocet) Tylenol or Advil advised.  Barnett Applebaum, MD, [facility], [facility] 10/01/2020  4:03 PM

## 2020-10-01 NOTE — Telephone Encounter (Signed)
Received referral from Dr. Kenton Kingfisher for gyn oncology. In reviewing imaging she also needs to see medical oncology. Cervical biopsy performed today, results pending. Will arrange medical and gyn oncology appointments.

## 2020-10-02 ENCOUNTER — Other Ambulatory Visit: Payer: Self-pay | Admitting: Family Medicine

## 2020-10-02 DIAGNOSIS — R928 Other abnormal and inconclusive findings on diagnostic imaging of breast: Secondary | ICD-10-CM

## 2020-10-02 DIAGNOSIS — N6310 Unspecified lump in the right breast, unspecified quadrant: Secondary | ICD-10-CM

## 2020-10-03 LAB — SURGICAL PATHOLOGY

## 2020-10-04 ENCOUNTER — Inpatient Hospital Stay: Payer: BC Managed Care – PPO

## 2020-10-04 ENCOUNTER — Encounter: Payer: Self-pay | Admitting: Oncology

## 2020-10-04 ENCOUNTER — Inpatient Hospital Stay: Payer: BC Managed Care – PPO | Attending: Oncology | Admitting: Oncology

## 2020-10-04 VITALS — BP 152/115 | HR 103 | Temp 97.9°F | Resp 16 | Wt 239.9 lb

## 2020-10-04 DIAGNOSIS — E8809 Other disorders of plasma-protein metabolism, not elsewhere classified: Secondary | ICD-10-CM | POA: Diagnosis not present

## 2020-10-04 DIAGNOSIS — N889 Noninflammatory disorder of cervix uteri, unspecified: Secondary | ICD-10-CM | POA: Insufficient documentation

## 2020-10-04 DIAGNOSIS — N888 Other specified noninflammatory disorders of cervix uteri: Secondary | ICD-10-CM

## 2020-10-04 DIAGNOSIS — I10 Essential (primary) hypertension: Secondary | ICD-10-CM | POA: Insufficient documentation

## 2020-10-04 DIAGNOSIS — E669 Obesity, unspecified: Secondary | ICD-10-CM | POA: Diagnosis not present

## 2020-10-04 DIAGNOSIS — R1030 Lower abdominal pain, unspecified: Secondary | ICD-10-CM | POA: Diagnosis not present

## 2020-10-04 DIAGNOSIS — Z780 Asymptomatic menopausal state: Secondary | ICD-10-CM | POA: Insufficient documentation

## 2020-10-04 DIAGNOSIS — C801 Malignant (primary) neoplasm, unspecified: Secondary | ICD-10-CM | POA: Insufficient documentation

## 2020-10-04 DIAGNOSIS — Z7189 Other specified counseling: Secondary | ICD-10-CM

## 2020-10-04 DIAGNOSIS — C786 Secondary malignant neoplasm of retroperitoneum and peritoneum: Secondary | ICD-10-CM | POA: Insufficient documentation

## 2020-10-04 DIAGNOSIS — R7989 Other specified abnormal findings of blood chemistry: Secondary | ICD-10-CM | POA: Diagnosis not present

## 2020-10-04 LAB — CYTOLOGY - PAP

## 2020-10-04 LAB — CBC WITH DIFFERENTIAL/PLATELET
Abs Immature Granulocytes: 0.02 10*3/uL (ref 0.00–0.07)
Basophils Absolute: 0 10*3/uL (ref 0.0–0.1)
Basophils Relative: 0 %
Eosinophils Absolute: 0.1 10*3/uL (ref 0.0–0.5)
Eosinophils Relative: 2 %
HCT: 41.9 % (ref 36.0–46.0)
Hemoglobin: 12.9 g/dL (ref 12.0–15.0)
Immature Granulocytes: 0 %
Lymphocytes Relative: 25 %
Lymphs Abs: 1.5 10*3/uL (ref 0.7–4.0)
MCH: 27 pg (ref 26.0–34.0)
MCHC: 30.8 g/dL (ref 30.0–36.0)
MCV: 87.7 fL (ref 80.0–100.0)
Monocytes Absolute: 0.6 10*3/uL (ref 0.1–1.0)
Monocytes Relative: 9 %
Neutro Abs: 3.8 10*3/uL (ref 1.7–7.7)
Neutrophils Relative %: 64 %
Platelets: 393 10*3/uL (ref 150–400)
RBC: 4.78 MIL/uL (ref 3.87–5.11)
RDW: 13.2 % (ref 11.5–15.5)
WBC: 6 10*3/uL (ref 4.0–10.5)
nRBC: 0 % (ref 0.0–0.2)

## 2020-10-04 LAB — COMPREHENSIVE METABOLIC PANEL
ALT: 20 U/L (ref 0–44)
AST: 31 U/L (ref 15–41)
Albumin: 3.4 g/dL — ABNORMAL LOW (ref 3.5–5.0)
Alkaline Phosphatase: 95 U/L (ref 38–126)
Anion gap: 12 (ref 5–15)
BUN: 17 mg/dL (ref 8–23)
CO2: 24 mmol/L (ref 22–32)
Calcium: 8.9 mg/dL (ref 8.9–10.3)
Chloride: 106 mmol/L (ref 98–111)
Creatinine, Ser: 2.55 mg/dL — ABNORMAL HIGH (ref 0.44–1.00)
GFR, Estimated: 21 mL/min — ABNORMAL LOW (ref >60)
Glucose, Bld: 99 mg/dL (ref 70–99)
Potassium: 3.9 mmol/L (ref 3.5–5.1)
Sodium: 142 mmol/L (ref 135–145)
Total Bilirubin: 0.4 mg/dL (ref 0.3–1.2)
Total Protein: 7.7 g/dL (ref 6.5–8.1)

## 2020-10-04 NOTE — Progress Notes (Signed)
Biopsy reviewed PAP pending Referral pending  Barnett Applebaum, MD, [facility], [facility] 10/04/2020  9:08 AM

## 2020-10-05 LAB — CA 125: Cancer Antigen (CA) 125: 7313 U/mL — ABNORMAL HIGH (ref 0.0–38.1)

## 2020-10-06 ENCOUNTER — Encounter: Payer: Self-pay | Admitting: Oncology

## 2020-10-06 DIAGNOSIS — Z7189 Other specified counseling: Secondary | ICD-10-CM | POA: Insufficient documentation

## 2020-10-06 NOTE — Progress Notes (Signed)
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 Kenton Kingfisher  Date of visit: 10/06/20   History of presenting illness- Patient is a 65 year old African-American female with a past medical history significant for hypertension and hyperlipidemia.  She was seen by GYN Dr. Kenton Kingfisher after she underwent a CT abdomen for symptoms of lower abdominal pain.  CT scan showed small left pleural effusion.  Multiple prominent borderline enlarged retroperitoneal and pelvic lymph nodes.  These ranged from 1 to 1.3 cm.  Masslike enlargement of the cervix measuring 3.8 x 3 x 2.9 cm.  Extensive omental nodularity most notable in the low anatomy pelvis.  Patient underwent cervical biopsy in GYN office but biopsy results did not show any evidence of malignancy.  Patient will be seeing GYN oncology next week.  Patient is here with her husband today.  She works as a Recruitment consultant for school.  Reports mild lower abdominal pain For which she is currently on oxycodone.  Bowel movements are regular.  Denies any significant weight loss over the last 3 months.  Appetite is normal.  She denies any vaginal bleeding ECOG PS- 1  Pain scale- 2   Review of systems- Review of Systems  Constitutional: Positive for malaise/fatigue. Negative for chills, fever and weight loss.  HENT: Negative for congestion, ear discharge and nosebleeds.   Eyes: Negative for blurred vision.  Respiratory: Negative for cough, hemoptysis, sputum production, shortness of breath and wheezing.   Cardiovascular: Negative for chest pain, palpitations, orthopnea and claudication.  Gastrointestinal: Negative for abdominal pain, blood in stool,  constipation, diarrhea, heartburn, melena, nausea and vomiting.       Lower abdominal pain  Genitourinary: Negative for dysuria, flank pain, frequency, hematuria and urgency.  Musculoskeletal: Negative for back pain, joint pain and myalgias.  Skin: Negative for rash.  Neurological: Negative for dizziness, tingling, focal weakness, seizures, weakness and headaches.  Endo/Heme/Allergies: Does not bruise/bleed easily.  Psychiatric/Behavioral: Negative for depression and suicidal ideas. The patient does not have insomnia.     No Known Allergies  Patient Active Problem List   Diagnosis Date Noted  . Breast lump on right side at 10 o'clock position 09/23/2020  . Diverticulitis 09/23/2020  . Umbilical hernia 50/08/7046  . Enlarged thyroid gland 03/12/2020  . Hyperlipidemia 07/13/2016  . Hypertension 06/10/2016  . Morbid obesity ([place]) 06/10/2016     Past Medical History:  Diagnosis Date  . Diverticulitis   . Hypertension      Past Surgical History:  Procedure Laterality Date  . CESAREAN SECTION    . WRIST FRACTURE SURGERY Left     Social History   Socioeconomic History  . Marital status: Married    Spouse name: Tempie Donning  . Number of children: 3  . Years of education: high school  . Highest education level: Not on file  Occupational History  . Not on file  Tobacco Use  . Smoking status: Never Smoker  . Smokeless tobacco: Never Used  Substance and Sexual Activity  . Alcohol use: Not Currently  . Drug use: Never  . Sexual activity: Yes    Birth control/protection: Post-menopausal  Other Topics Concern  . Not on file  Social History Narrative   03/12/20   From: the area  Living: with Tempie Donning, husband (2012) and one step son   Work: school bus driver      Family: 3 adult children - [place], [place], [place] Drummer - 6-7 grandchildren      Enjoys: singing in the choir at church      Exercise: weight lifting in the morning, stretches   Diet: varied, tries to eat healthy -  baked salmon and fish/chicken/fried food      Safety   Seat belts: Yes    Guns: No   Safe in relationships: Yes    Social Determinants of Radio broadcast assistant Strain: Not on file  Food Insecurity: Not on file  Transportation Needs: Not on file  Physical Activity: Not on file  Stress: Not on file  Social Connections: Not on file  Intimate Partner Violence: Not on file     Family History  Problem Relation Age of Onset  . COPD Mother   . Heart failure Mother      Current Outpatient Medications:  .  ibuprofen (ADVIL) 200 MG tablet, Take 400 mg by mouth every 6 (six) hours as needed for fever or mild pain., Disp: , Rfl:  .  atorvastatin (LIPITOR) 10 MG tablet, Take 1 tablet (10 mg total) by mouth daily. (Patient not taking: Reported on 10/04/2020), Disp: 90 tablet, Rfl: 3 .  Multiple Vitamin (MULTIVITAMIN WITH MINERALS) TABS tablet, Take 1 tablet by mouth daily. (Patient not taking: Reported on 10/04/2020), Disp: , Rfl:    Physical exam:  Vitals:   10/04/20 1502  BP: (!) 152/115  Pulse: (!) 103  Resp: 16  Temp: 97.9 F (36.6 C)  TempSrc: Tympanic  SpO2: 99%  Weight: 239 lb 14.4 oz (108.8 kg)   Physical Exam Constitutional:      General: She is not in acute distress.    Appearance: She is obese.  Cardiovascular:     Rate and Rhythm: Normal rate and regular rhythm.     Heart sounds: Normal heart sounds.  Pulmonary:     Effort: Pulmonary effort is normal.     Breath sounds: Normal breath sounds.  Abdominal:     General: Bowel sounds are normal.     Palpations: Abdomen is soft.     Comments: Palpable lipoma over the left upper quadrant.  Musculoskeletal:     Cervical back: Normal range of motion.  Skin:    General: Skin is warm and dry.  Neurological:     Mental Status: She is alert and oriented to person, place, and time.        CMP Latest Ref Rng & Units 10/04/2020  Glucose 70 - 99 mg/dL 99  BUN 8 - 23 mg/dL 17  Creatinine 0.44 - 1.00 mg/dL 2.55(H)   Sodium 135 - 145 mmol/L 142  Potassium 3.5 - 5.1 mmol/L 3.9  Chloride 98 - 111 mmol/L 106  CO2 22 - 32 mmol/L 24  Calcium 8.9 - 10.3 mg/dL 8.9  Total Protein 6.5 - 8.1 g/dL 7.7  Total Bilirubin 0.3 - 1.2 mg/dL 0.4  Alkaline Phos 38 - 126 U/L 95  AST 15 - 41 U/L 31  ALT 0 - 44 U/L 20   CBC Latest Ref Rng & Units 10/04/2020  WBC 4.0 - 10.5 K/uL 6.0  Hemoglobin 12.0 - 15.0 g/dL 12.9  Hematocrit 36.0 - 46.0 % 41.9  Platelets 150 - 400 K/uL 393    No images are attached to the encounter.  CT ABDOMEN PELVIS WO CONTRAST  Result Date: 09/30/2020 CLINICAL  DATA:  65 year old female with abdominal pain greater on the left. Recent diverticulitis status post full course of antibiotics. Two episodes of bright red blood in stool yesterday. EXAM: CT ABDOMEN AND PELVIS WITHOUT CONTRAST TECHNIQUE: Multidetector CT imaging of the abdomen and pelvis was performed following the standard protocol without IV contrast. COMPARISON:  CT Abdomen and Pelvis 09/04/2020. FINDINGS: Lower chest: Small layering left pleural effusion is new since last month. No cardiomegaly or pericardial effusion. Mild lung base atelectasis. Hepatobiliary: Trace perihepatic free fluid is new since last month. Otherwise noncontrast liver and gallbladder appear stable. Pancreas: Negative. Spleen: Small volume perisplenic free fluid is new since last month. Simple fluid density. Otherwise stable noncontrast spleen. Adrenals/Urinary Tract: Normal adrenal glands. New bilateral hydronephrosis (series 2, image 32). No nephrolithiasis. Both ureters are difficult to delineate at the pelvic inlet where to generalized increased inflammation is noted from last month. Urinary bladder is diminutive. Stomach/Bowel: New free fluid layering in both gutters greater on the left. Simple fluid density on that side (coronal image 90). Generalized mesenteric stranding in the distal omentum (coronal image 74 and series 2, image 64) superficial to the uterus and  adnexa which are abnormal, described below. No dilated small or large bowel loops. Diverticulosis of the large bowel. Sigmoid colon is indistinct and inseparable from the uterine fundus on sagittal image 116. No pneumoperitoneum is identified. Negative terminal ileum. Appendix cannot be delineated but likely courses toward the uterine fundus. Decompressed stomach and duodenum. Vascular/Lymphatic: Aortoiliac calcified atherosclerosis. Normal caliber abdominal aorta. Vascular patency is not evaluated in the absence of IV contrast. Reproductive: Moderate generalized parametrial inflammation has increased (series 2, image 63). Continued gas in the vagina. No gas identified within the endometrium. Other: Increased presacral stranding since last month. No layering pelvic free fluid. Small fat containing inguinal hernias are stable. Musculoskeletal: Lumbar facet arthropathy. No acute osseous abnormality identified. IMPRESSION: 1. Small volume new free fluid in the abdomen and pelvis. No free air identified. Increased and generalized inflammation at the pelvic inlet, in the distal omentum, and about the uterus and ovaries. Sigmoid colon appears indistinct and inseparable from the uterine fundus, with persistent gas within the vagina. Appendix not delineated. New bilateral hydronephrosis. 2. Recommend repeat CT Abdomen and Pelvis with oral and IV contrast to further characterize (and recommend sufficient oral contrast and timing delay to ensure contrast has reached the sigmoid colon). 3. Small new layering left pleural effusion. Electronically Signed   By: Genevie Ann M.D.   On: 09/30/2020 06:28   CT ABDOMEN PELVIS W CONTRAST  Result Date: 09/30/2020 CLINICAL DATA:  65 year old female with history of left lower abdominal pain. Suspected abdominal abscess or infection. EXAM: CT ABDOMEN AND PELVIS WITH CONTRAST TECHNIQUE: Multidetector CT imaging of the abdomen and pelvis was performed using the standard protocol following  bolus administration of intravenous contrast. CONTRAST:  50m OMNIPAQUE IOHEXOL 300 MG/ML  SOLN COMPARISON:  CT of the abdomen and pelvis 10/01/2018 to. FINDINGS: Lower chest: Small left pleural effusion lying dependently with some associated passive subsegmental atelectasis in the left lower lobe. Scarring or subsegmental atelectasis in the right middle lobe also noted. Hepatobiliary: No suspicious cystic or solid hepatic lesions. No intra or extrahepatic biliary ductal dilatation. Gallbladder is normal in appearance. Pancreas: Fatty infiltration in the pancreas. No pancreatic mass. No pancreatic ductal dilatation. No pancreatic or peripancreatic fluid collections or inflammatory changes. Spleen: Unremarkable. Adrenals/Urinary Tract: Bilateral kidneys and adrenal glands are normal in appearance. No hydroureteronephrosis. Urinary bladder is nearly completely collapsed,  but otherwise unremarkable in appearance. Stomach/Bowel: Stomach is completely decompressed, but otherwise unremarkable in appearance. No pathologic dilatation of small bowel or colon. Numerous colonic diverticulae are noted, without surrounding inflammatory changes to clearly indicate an acute diverticulitis at this time. Appendix is not confidently identified may be surgically absent. Vascular/Lymphatic: Atherosclerosis in the pelvic vasculature. No aneurysm or dissection noted in the abdominal or pelvic vasculature. Multiple prominent borderline enlarged and mildly enlarged retroperitoneal and pelvic lymph nodes. Specific examples include a left para-aortic lymph node measuring 1 cm in short axis adjacent to the left renal hilum (axial image 33 of series 2), 1.3 cm in short axis in the left para-aortic nodal station inferior to the left renal hilum (axial image 40 of series 2), 1 cm in short axis in the right external iliac nodal station (axial images 62 and 64 of series 2) and 1.3 cm in short axis in the right common femoral nodal station (axial  image 75 of series 2). Borderline enlarged mesorectal lymph node on the right (axial image 75 of series 2) measuring 9 mm in short axis. Reproductive: Mass-like enlargement of the cervix measuring 3.8 x 3.0 x 2.9 cm. Remainder of the uterus and ovaries are otherwise unremarkable in appearance. Other: Trace volume of ascites. Extensive omental nodularity, most notable in the low anatomic pelvis. Bilateral inguinal hernias containing predominantly fat, although some of the lower omental nodularity extends into the right inguinal hernia. Small umbilical hernia containing omental fat. Trace volume of ascites. No pneumoperitoneum. Musculoskeletal: There are no aggressive appearing lytic or blastic lesions noted in the visualized portions of the skeleton. IMPRESSION: 1. No intra-abdominal abscess. 2. 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 Notably, the cervix has a mass-like appearance. Further evaluation with pelvic examination is recommended to exclude gynecologic malignancy. 3. Small left pleural effusion. 4. Colonic diverticulosis without evidence of acute diverticulitis at this time. 5. Additional incidental findings, as above. Electronically Signed   By: Vinnie Langton M.D.   On: 09/30/2020 11:55   US BREAST LTD UNI RIGHT INC AXILLA  Result Date: 10/01/2020 CLINICAL DATA:  65 year old female presenting with a new palpable area of concern in the right breast. EXAM: DIGITAL DIAGNOSTIC UNILATERAL RIGHT MAMMOGRAM WITH TOMOSYNTHESIS AND CAD; ULTRASOUND RIGHT BREAST LIMITED TECHNIQUE: Right digital diagnostic mammography and breast tomosynthesis was performed. The images were evaluated with computer-aided detection.; Targeted ultrasound examination of the right breast was performed COMPARISON:  Previous exam(s). ACR  Breast Density Category b: There are scattered areas of fibroglandular density. FINDINGS: Mammogram: A skin BB marks the site of concern reported by the patient in the upper outer right breast. A spot tangential view of this area is performed in addition to standard views. There is a round circumscribed mass with central fatty hilum measuring approximately 1.2 cm at the palpable site, most likely an intramammary lymph node. There are no additional new findings elsewhere in the right breast. On physical exam at site of concern reported by the patient I feel a discrete mass. Ultrasound: Targeted ultrasound is performed at the palpable site of concern in the right breast at 9:30 o'clock 9 cm from the nipple demonstrating a round circumscribed hypoechoic mass with central fatty hilum measuring 1.3 x 1.3 x 1.1 cm, consistent with a intramammary lymph node. There is cortical thickening measuring 0.5 cm. This corresponds to the mammographic finding. Targeted ultrasound of  the right axilla demonstrates normal lymph nodes. IMPRESSION: Abnormal intramammary lymph node in the right breast at 9:30 o'clock at the palpable site reported by the patient. RECOMMENDATION: Ultrasound-guided core needle biopsy of the right breast mass at 9:30 o'clock. I have discussed the findings and recommendations with the patient who agrees to proceed with biopsy. The patient will be contacted by our scheduler to arrange the biopsy appointment. BI-RADS CATEGORY  4: Suspicious. Electronically Signed   By: Audie Pinto M.D.   On: 10/01/2020 11:23   MM DIAG BREAST TOMO UNI RIGHT  Result Date: 10/01/2020 CLINICAL DATA:  65 year old female presenting with a new palpable area of concern in the right breast. EXAM: DIGITAL DIAGNOSTIC UNILATERAL RIGHT MAMMOGRAM WITH TOMOSYNTHESIS AND CAD; ULTRASOUND RIGHT BREAST LIMITED TECHNIQUE: Right digital diagnostic mammography and breast tomosynthesis was performed. The images were evaluated with  computer-aided detection.; Targeted ultrasound examination of the right breast was performed COMPARISON:  Previous exam(s). ACR Breast Density Category b: There are scattered areas of fibroglandular density. FINDINGS: Mammogram: A skin BB marks the site of concern reported by the patient in the upper outer right breast. A spot tangential view of this area is performed in addition to standard views. There is a round circumscribed mass with central fatty hilum measuring approximately 1.2 cm at the palpable site, most likely an intramammary lymph node. There are no additional new findings elsewhere in the right breast. On physical exam at site of concern reported by the patient I feel a discrete mass. Ultrasound: Targeted ultrasound is performed at the palpable site of concern in the right breast at 9:30 o'clock 9 cm from the nipple demonstrating a round circumscribed hypoechoic mass with central fatty hilum measuring 1.3 x 1.3 x 1.1 cm, consistent with a intramammary lymph node. There is cortical thickening measuring 0.5 cm. This corresponds to the mammographic finding. Targeted ultrasound of the right axilla demonstrates normal lymph nodes. IMPRESSION: Abnormal intramammary lymph node in the right breast at 9:30 o'clock at the palpable site reported by the patient. RECOMMENDATION: Ultrasound-guided core needle biopsy of the right breast mass at 9:30 o'clock. I have discussed the findings and recommendations with the patient who agrees to proceed with biopsy. The patient will be contacted by our scheduler to arrange the biopsy appointment. BI-RADS CATEGORY  4: Suspicious. Electronically Signed   By: Audie Pinto M.D.   On: 10/01/2020 11:23    Assessment and plan- Patient is a 65 y.o. female found to have omental carcinomatosis, retroperitoneal and pelvic sidewall adenopathy and masslike thickening of cervix concerning for GYN malignancy probably metastatic cervical cancer.  She has been referred for further  management  I have reviewed CT abdomen and pelvis images independently and discussed findings with the patient and her husband.  CT findings are concerning for metastatic GYN malignancy likely cervical primary.  Cervical biopsy that was done at the GYN office was not diagnostic of malignancy.  She will therefore need a second biopsy.  I did discuss patient's case with interventional radiology who recommends a PET CT scan to decide if patient would benefit from a retroperitoneal lymph node biopsy versus an omental biopsy.  We will therefore proceed with a PET CT scan ASAP before ordering the biopsy.  Patient will also be seen by GYN oncology next week and another office cervical biopsy may be attempted.  Discussed with the patient and her husband that overall findings are concerning for metastatic stage IV disease.  Therefore surgery or radiation would not be in the  picture typically.  I would recommend systemic chemotherapy for this.  If biopsy is consistent with stage IV cervical cancer I would recommend carboplatin AUC 5+ Taxol 175 mg per metered squared given IV every 3 weeks until progression or toxicity.  Would also recommend adding a Avastin and possible Keytruda if PD-L1 more than 1% on biopsy specimen.  I will discuss all this in greater detail after biopsy results are back.  Patient and her husband are deeply spiritual and believe in the power of god and healing and would like to see what PET scan shows before proceeding with port placement.  I will see her after PET CT scan and biopsy results are back. Will check CA 125 today   Total face to face encounter time for this patient visit was 45 min.  Time spent in  discussing patients case with Dr. Reesa Chew Interventional radiology    Thank you for this kind referral and the opportunity to participate in the care of this patient   Visit Diagnosis 1. Cervical mass   2. Goals of care, counseling/discussion   3. 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 Randa Evens, MD, MPH [facility] [phone] 10/06/2020 9:31 PM

## 2020-10-09 ENCOUNTER — Other Ambulatory Visit: Payer: Self-pay | Admitting: Nurse Practitioner

## 2020-10-09 ENCOUNTER — Inpatient Hospital Stay (HOSPITAL_BASED_OUTPATIENT_CLINIC_OR_DEPARTMENT_OTHER): Payer: BC Managed Care – PPO | Admitting: Obstetrics and Gynecology

## 2020-10-09 ENCOUNTER — Observation Stay
Admission: RE | Admit: 2020-10-09 | Discharge: 2020-10-09 | Disposition: A | Discharge status: Home / Self Care | Payer: BC Managed Care – PPO | Source: Ambulatory Visit | Attending: Nurse Practitioner | Admitting: Nurse Practitioner

## 2020-10-09 ENCOUNTER — Other Ambulatory Visit: Payer: Self-pay

## 2020-10-09 ENCOUNTER — Other Ambulatory Visit: Payer: Self-pay | Admitting: *Deleted

## 2020-10-09 ENCOUNTER — Other Ambulatory Visit: Payer: Self-pay | Admitting: Internal Medicine

## 2020-10-09 ENCOUNTER — Encounter: Payer: Self-pay | Admitting: Internal Medicine

## 2020-10-09 ENCOUNTER — Inpatient Hospital Stay
Admission: AD | Admit: 2020-10-09 | Discharge: 2020-10-11 | DRG: 660 | Disposition: A | Discharge status: Home / Self Care | Payer: BC Managed Care – PPO | Source: Ambulatory Visit | Attending: Internal Medicine | Admitting: Internal Medicine

## 2020-10-09 ENCOUNTER — Telehealth: Payer: Self-pay | Admitting: Nurse Practitioner

## 2020-10-09 ENCOUNTER — Inpatient Hospital Stay: Payer: BC Managed Care – PPO

## 2020-10-09 VITALS — BP 158/105 | HR 103 | Temp 97.5°F | Resp 20 | Wt 244.8 lb

## 2020-10-09 DIAGNOSIS — N888 Other specified noninflammatory disorders of cervix uteri: Secondary | ICD-10-CM

## 2020-10-09 DIAGNOSIS — R109 Unspecified abdominal pain: Secondary | ICD-10-CM

## 2020-10-09 DIAGNOSIS — R7989 Other specified abnormal findings of blood chemistry: Secondary | ICD-10-CM | POA: Diagnosis not present

## 2020-10-09 DIAGNOSIS — C786 Secondary malignant neoplasm of retroperitoneum and peritoneum: Secondary | ICD-10-CM

## 2020-10-09 DIAGNOSIS — K429 Umbilical hernia without obstruction or gangrene: Secondary | ICD-10-CM | POA: Diagnosis present

## 2020-10-09 DIAGNOSIS — C579 Malignant neoplasm of female genital organ, unspecified: Secondary | ICD-10-CM

## 2020-10-09 DIAGNOSIS — E8809 Other disorders of plasma-protein metabolism, not elsewhere classified: Secondary | ICD-10-CM | POA: Diagnosis not present

## 2020-10-09 DIAGNOSIS — R1030 Lower abdominal pain, unspecified: Secondary | ICD-10-CM | POA: Diagnosis not present

## 2020-10-09 DIAGNOSIS — N179 Acute kidney failure, unspecified: Secondary | ICD-10-CM | POA: Diagnosis not present

## 2020-10-09 DIAGNOSIS — Z66 Do not resuscitate: Secondary | ICD-10-CM | POA: Diagnosis present

## 2020-10-09 DIAGNOSIS — E785 Hyperlipidemia, unspecified: Secondary | ICD-10-CM | POA: Diagnosis present

## 2020-10-09 DIAGNOSIS — N1330 Unspecified hydronephrosis: Secondary | ICD-10-CM | POA: Diagnosis present

## 2020-10-09 DIAGNOSIS — N131 Hydronephrosis with ureteral stricture, not elsewhere classified: Secondary | ICD-10-CM | POA: Diagnosis present

## 2020-10-09 DIAGNOSIS — Z6841 Body Mass Index (BMI) 40.0 and over, adult: Secondary | ICD-10-CM

## 2020-10-09 DIAGNOSIS — N6311 Unspecified lump in the right breast, upper outer quadrant: Secondary | ICD-10-CM | POA: Diagnosis present

## 2020-10-09 DIAGNOSIS — C539 Malignant neoplasm of cervix uteri, unspecified: Secondary | ICD-10-CM | POA: Diagnosis present

## 2020-10-09 DIAGNOSIS — Z8249 Family history of ischemic heart disease and other diseases of the circulatory system: Secondary | ICD-10-CM

## 2020-10-09 DIAGNOSIS — I10 Essential (primary) hypertension: Secondary | ICD-10-CM | POA: Diagnosis not present

## 2020-10-09 DIAGNOSIS — R935 Abnormal findings on diagnostic imaging of other abdominal regions, including retroperitoneum: Secondary | ICD-10-CM

## 2020-10-09 DIAGNOSIS — Z79899 Other long term (current) drug therapy: Secondary | ICD-10-CM

## 2020-10-09 DIAGNOSIS — N889 Noninflammatory disorder of cervix uteri, unspecified: Secondary | ICD-10-CM | POA: Diagnosis present

## 2020-10-09 DIAGNOSIS — Z20822 Contact with and (suspected) exposure to covid-19: Secondary | ICD-10-CM | POA: Diagnosis present

## 2020-10-09 DIAGNOSIS — R591 Generalized enlarged lymph nodes: Secondary | ICD-10-CM

## 2020-10-09 DIAGNOSIS — C801 Malignant (primary) neoplasm, unspecified: Secondary | ICD-10-CM

## 2020-10-09 DIAGNOSIS — N3289 Other specified disorders of bladder: Secondary | ICD-10-CM | POA: Diagnosis present

## 2020-10-09 DIAGNOSIS — N368 Other specified disorders of urethra: Secondary | ICD-10-CM | POA: Diagnosis present

## 2020-10-09 DIAGNOSIS — E669 Obesity, unspecified: Secondary | ICD-10-CM | POA: Diagnosis not present

## 2020-10-09 DIAGNOSIS — Z780 Asymptomatic menopausal state: Secondary | ICD-10-CM | POA: Diagnosis not present

## 2020-10-09 DIAGNOSIS — N141 Nephropathy induced by other drugs, medicaments and biological substances: Secondary | ICD-10-CM | POA: Diagnosis present

## 2020-10-09 LAB — CBC WITH DIFFERENTIAL/PLATELET
Abs Immature Granulocytes: 0.03 10*3/uL (ref 0.00–0.07)
Basophils Absolute: 0 10*3/uL (ref 0.0–0.1)
Basophils Relative: 0 %
Eosinophils Absolute: 0.1 10*3/uL (ref 0.0–0.5)
Eosinophils Relative: 2 %
HCT: 38.4 % (ref 36.0–46.0)
Hemoglobin: 11.6 g/dL — ABNORMAL LOW (ref 12.0–15.0)
Immature Granulocytes: 1 %
Lymphocytes Relative: 20 %
Lymphs Abs: 1.2 10*3/uL (ref 0.7–4.0)
MCH: 26.2 pg (ref 26.0–34.0)
MCHC: 30.2 g/dL (ref 30.0–36.0)
MCV: 86.7 fL (ref 80.0–100.0)
Monocytes Absolute: 0.5 10*3/uL (ref 0.1–1.0)
Monocytes Relative: 9 %
Neutro Abs: 4.1 10*3/uL (ref 1.7–7.7)
Neutrophils Relative %: 68 %
Platelets: 437 10*3/uL — ABNORMAL HIGH (ref 150–400)
RBC: 4.43 MIL/uL (ref 3.87–5.11)
RDW: 13.4 % (ref 11.5–15.5)
WBC: 6 10*3/uL (ref 4.0–10.5)
nRBC: 0 % (ref 0.0–0.2)

## 2020-10-09 LAB — HIV ANTIBODY (ROUTINE TESTING W REFLEX): HIV Screen 4th Generation wRfx: NONREACTIVE

## 2020-10-09 LAB — URINALYSIS, COMPLETE (UACMP) WITH MICROSCOPIC
Bilirubin Urine: NEGATIVE
Glucose, UA: NEGATIVE mg/dL
Ketones, ur: NEGATIVE mg/dL
Leukocytes,Ua: NEGATIVE
Nitrite: NEGATIVE
Protein, ur: NEGATIVE mg/dL
Specific Gravity, Urine: 1.004 — ABNORMAL LOW (ref 1.005–1.030)
pH: 6 (ref 5.0–8.0)

## 2020-10-09 LAB — BASIC METABOLIC PANEL
Anion gap: 13 (ref 5–15)
BUN: 24 mg/dL — ABNORMAL HIGH (ref 8–23)
CO2: 24 mmol/L (ref 22–32)
Calcium: 8.9 mg/dL (ref 8.9–10.3)
Chloride: 105 mmol/L (ref 98–111)
Creatinine, Ser: 3.52 mg/dL — ABNORMAL HIGH (ref 0.44–1.00)
GFR, Estimated: 14 mL/min — ABNORMAL LOW (ref >60)
Glucose, Bld: 105 mg/dL — ABNORMAL HIGH (ref 70–99)
Potassium: 4.4 mmol/L (ref 3.5–5.1)
Sodium: 142 mmol/L (ref 135–145)

## 2020-10-09 LAB — BRAIN NATRIURETIC PEPTIDE: B Natriuretic Peptide: 19 pg/mL (ref 0.0–100.0)

## 2020-10-09 LAB — OSMOLALITY, URINE: Osmolality, Ur: 152 mOsm/kg — ABNORMAL LOW (ref 300–900)

## 2020-10-09 LAB — SODIUM, URINE, RANDOM: Sodium, Ur: 15 mmol/L

## 2020-10-09 LAB — TSH: TSH: 1.724 u[IU]/mL (ref 0.350–4.500)

## 2020-10-09 MED ORDER — HYDRALAZINE HCL 50 MG PO TABS
25.0000 mg | ORAL_TABLET | Freq: Four times a day (QID) | ORAL | Status: DC | PRN
  Administered 2020-10-10: 25 mg via ORAL
  Filled 2020-10-09: qty 1

## 2020-10-09 MED ORDER — ACETAMINOPHEN 650 MG RE SUPP: 650.0000 mg | Freq: Four times a day (QID) | RECTAL | Status: DC | PRN

## 2020-10-09 MED ORDER — ADULT MULTIVITAMIN W/MINERALS CH
1.0000 | ORAL_TABLET | Freq: Every day | ORAL | Status: DC
  Administered 2020-10-10: 1 via ORAL
  Filled 2020-10-09: qty 1

## 2020-10-09 MED ORDER — HEPARIN SODIUM (PORCINE) 5000 UNIT/ML IJ SOLN: 5000.0000 [IU] | Freq: Three times a day (TID) | INTRAMUSCULAR | Status: DC

## 2020-10-09 MED ORDER — LACTATED RINGERS IV SOLN: INTRAVENOUS | Status: AC

## 2020-10-09 MED ORDER — ACETAMINOPHEN 325 MG PO TABS: 650.0000 mg | ORAL_TABLET | Freq: Four times a day (QID) | ORAL | Status: DC | PRN

## 2020-10-09 MED ORDER — ONDANSETRON HCL 4 MG PO TABS: 4.0000 mg | ORAL_TABLET | Freq: Four times a day (QID) | ORAL | Status: DC | PRN

## 2020-10-09 MED ORDER — TRAMADOL HCL 50 MG PO TABS
50.0000 mg | ORAL_TABLET | Freq: Two times a day (BID) | ORAL | Status: DC | PRN
  Administered 2020-10-10: 17:00:00 50 mg via ORAL
  Filled 2020-10-09: qty 1

## 2020-10-09 MED ORDER — LACTATED RINGERS IV SOLN: INTRAVENOUS | Status: DC

## 2020-10-09 MED ORDER — ONDANSETRON HCL 4 MG/2ML IJ SOLN: 4.0000 mg | Freq: Four times a day (QID) | INTRAMUSCULAR | Status: DC | PRN

## 2020-10-09 NOTE — Progress Notes (Addendum)
VAST consult received to obtain IV access; request for 20g IV above wrist for diagnostic procedures which have not yet been ordered. VAST RN called unit and spoke with patient's nurse; advised that VAST RN will be unable to come assess patient. Advised patient's nurse to seek out coworkers who might be able to help with IV access and if unsuccessful to call [facility] house coverage. Also informed that an ER nurse who is ultrasound trained might be contacted for assistance. Understanding verbalized.

## 2020-10-09 NOTE — Addendum Note (Signed)
Addended by: Verlon Au on: 10/09/2020 11:01 AM   Modules accepted: Orders

## 2020-10-09 NOTE — Progress Notes (Addendum)
Gynecologic Oncology Consult Visit   Referring Provider: Dr. Kenton Kingfisher  Primary Medical Oncologist: Dr. Janese Banks  Chief Concern: gynecologic malignancy  Subjective:  Tracy Goodman is a 65 y.o. G3P3 female who is seen in consultation from Dr. Kenton Kingfisher for with a past medical history significant for hypertension and hyperlipidemia who presents with findings concerning for widely metastatic gynecologic malignancy.    She presented to the ER with left lower abdominal pain which began around Sep 04, 2020. Her symptoms were also associated with vaginal bleeding. Pain is lower pelvic and suprapubic, radiation towards vagina.   09/04/2020 CT A/P IMPRESSION: 1. Gas in the vagina with apparent irregular thickening and enhancement as well as inflammatory stranding throughout the pelvis, which may represent pelvic inflammatory disease, recommend clinical correlation and pelvic ultrasound for further evaluation.  2. Colonic diverticulosis with a short segment mild sigmoid colonic wall thickening in the left hemipelvis with inflammatory stranding throughout the pelvis, findings which are favored reactive and related to under distension however diverticulitis/short-segment colitis not entirely excluded on imaging. 3. Mild bladder wall thickening with perivesicular stranding may be reactive or represent cystitis. Correlation with urinalysis is recommended. 4. Prominent right greater than left iliac and pelvic sidewall lymph nodes, likely reactive. 5. Small fat containing umbilical and bilateral inguinal hernias.  09/04/2020 pelvic US Transabdominal ultrasound examination of the pelvis was performed including evaluation of the uterus, ovaries, adnexal regions, and pelvic cul-de-sac.  FINDINGS: Uterus Measurements: 10.8 x 5.4 x 7.5 cm = volume: 230.4 mL. No fibroids or other mass visualized.  Endometrium Thickness: 5 mm.  No focal abnormality visualized.  Right ovary Measurements: 3.4 x 2.4 x 2.8 cm =  volume: 11.6 mL. Normal appearance/no adnexal mass.  Left ovary Measurements: 4.0 x 2.1 x 2.6 cm = volume: 11.8 mL. Normal appearance/no adnexal mass.  Other findings:  No abnormal free fluid.  IMPRESSION: No abnormality appreciable by ultrasound.  10/01/2020 CT A/P  Multiple prominent borderline enlarged and mildly enlarged retroperitoneal and pelvic lymph nodes. Specific examples include a left para-aortic lymph node measuring 1 cm in short axis adjacent to the left renal hilum (axial image 33 of series 2), 1.3 cm in short axis in the left para-aortic nodal station inferior to the left renal hilum (axial image 40 of series 2), 1 cm in short axis in the right external iliac nodal station (axial images 62 and 64 of series 2) and 1.3 cm in short axis in the right common femoral nodal station (axial image 75 of series 2). Borderline enlarged mesorectal lymph node on the right (axial image 75 of series 2) measuring 9 mm in short axis.  Reproductive: Mass-like enlargement of the cervix measuring 3.8 x 3.0 x 2.9 cm. Remainder of the uterus and ovaries are otherwise unremarkable in appearance.  IMPRESSION: 1. No intra-abdominal abscess. 2. 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 Notably, the cervix has a mass-like appearance. Further evaluation with pelvic examination is recommended to exclude gynecologic malignancy. 3. Small left pleural effusion. 4. Colonic diverticulosis without evidence of acute diverticulitis at this time.   10/01/2020 Pelvic exam cervical mass noted on exam. Pap and biopsy obtained.   Pap: Atypical endocervical cells, favor neoplastic  CERVIX, MASS, BIOPSY:  - Squamous mucosa with koilocytic atypia and abundant glycogen.  - No evidence of malignancy.  - See  comment  CA125 = 7,313  10/04/2020  She was seen by Dr. Janese Banks and plan is to complete evaluation; obtain second biopsy and send for PD-L1. Plan for paclitaxel/carboplatin/bevacizumab/+/-pembrolizumab if primary cervical cancer but need the biopsy to confirm primary and finalize treatment plan.   PET/CT pending.   Her last Pap/pelvic was in 2015 and she is not absolutely certain she had a pap. She does not have routine gynecologic care.   Problem List: Patient Active Problem List   Diagnosis Date Noted  . Goals of care, counseling/discussion 10/06/2020  . Breast lump on right side at 10 o'clock position 09/23/2020  . Diverticulitis 09/23/2020  . Umbilical hernia 67/89/3810  . Enlarged thyroid gland 03/12/2020  . Hyperlipidemia 07/13/2016  . Hypertension 06/10/2016  . Morbid obesity ([place]) 06/10/2016    Past Medical History: Past Medical History:  Diagnosis Date  . Diverticulitis   . Hypertension     Past Surgical History: Past Surgical History:  Procedure Laterality Date  . CESAREAN SECTION    . WRIST FRACTURE SURGERY Left     Past Gynecologic History:  Menarche: age 64. Menopause at age 4, cycle 7 days Denies history of abnormal pap prior to this illness but she has not had routine gyn care Denies history of STIs Contraception: prior BTL  OB History:  OB History  Gravida Para Term Preterm AB Living  3 3          SAB IAB Ectopic Multiple Live Births               # Outcome Date GA Lbr Len/2nd Weight Sex Delivery Anes PTL Lv  3 Para           2 Para           1 Para             Obstetric Comments  She has had prior NSVD x2 and CS x1, and later a BTL.      Family History: Family History  Problem Relation Age of Onset  . COPD Mother   . Heart failure Mother     Social History: Social History   Socioeconomic History  . Marital status: Married    Spouse name: Tempie Donning  . Number of children: 3  . Years of education: high school  . Highest education level:  Not on file  Occupational History  . Not on file  Tobacco Use  . Smoking status: Never Smoker  . Smokeless tobacco: Never Used  Substance and Sexual Activity  . Alcohol use: Not Currently  . Drug use: Never  . Sexual activity: Yes    Birth control/protection: Post-menopausal  Other Topics Concern  . Not on file  Social History Narrative   03/12/20   From: the area   Living: with Tempie Donning, husband (2012) and one step son   Work: school bus driver      Family: 3 adult children - Oriental, Desha, Trilby Drummer - 6-7 grandchildren      Enjoys: singing in the choir at church      Exercise: weight lifting in the morning, stretches   Diet: varied, tries to eat healthy - baked salmon and fish/chicken/fried food      Safety   Seat belts: Yes    Guns: No   Safe in relationships: Yes    Social Determinants of Radio broadcast assistant Strain: Not on file  Food Insecurity: Not on file  Transportation Needs: Not on file  Physical Activity: Not on file  Stress: Not on file  Social  Connections: Not on file  Intimate Partner Violence: Not on file  She has retired but currently drives a school bus.   Allergies: No Known Allergies  Current Medications: Current Outpatient Medications  Medication Sig Dispense Refill  . ibuprofen (ADVIL) 200 MG tablet Take 400 mg by mouth every 6 (six) hours as needed for fever or mild pain.    . Multiple Vitamin (MULTIVITAMIN WITH MINERALS) TABS tablet Take 1 tablet by mouth daily.    Marland Kitchen atorvastatin (LIPITOR) 10 MG tablet Take 1 tablet (10 mg total) by mouth daily. (Patient not taking: No sig reported) 90 tablet 3   No current facility-administered medications for this visit.    Review of Systems General: negative for fevers, changes in weight or night sweats Skin: negative for changes in moles or sores or rash Eyes: negative for changes in vision HEENT: negative for change in hearing, tinnitus, voice changes positive for goiter Pulmonary: negative  for dyspnea, orthopnea, productive cough, wheezing Positive for SOB with exertion Cardiac: negative for palpitations, pain Gastrointestinal: negative for nausea, vomiting, constipation, diarrhea, hematemesis, hematochezia Genitourinary/Sexual: negative for dysuria, retention, hematuria, incontinence. Positive for frequency (every 2.5 hours) Ob/Gyn:  negative for abnormal bleeding, or pain Musculoskeletal: negative for pain, joint pain, back pain Hematology: negative for easy bruising, abnormal bleeding Neurologic/Psych: negative for headaches, seizures, paralysis, weakness, numbness  Objective:  Physical Examination:  BP (!) 158/105 Comment: 148/98  Pulse (!) 103   Temp (!) 97.5 F (36.4 C) (Tympanic)   Resp 20   Wt 244 lb 12.8 oz (111 kg)   SpO2 97%   BMI 49.44 kg/m    ECOG Performance Status: 1 - Symptomatic but completely ambulatory  GENERAL: Patient is a well appearing female in no acute distress HEENT:  PERRL, neck supple with midline trachea. Goiter NODES:  No cervical, supraclavicular, axillary, or inguinal lymphadenopathy palpated.  LUNGS:  Clear to auscultation bilaterally.   HEART:  Regular rate and rhythm ABDOMEN:  Soft, nontender, nondistended. No ascites; Possible mass in the LUQ ~6 cm and mobile (she states that has been there for a long time); and possible nodule superior to the umbilicus. EXTREMITIES:  No peripheral edema.   SKIN:  Clear with no obvious rashes or skin changes. No nail dyscrasia. NEURO:  Nonfocal. Well oriented.  Appropriate affect.  Pelvic:chaperoned by RN EGBUS: no lesions Cervix: on palpation 3 cm mass, but on speculum not readily visualized; there are no friable lesions, nontender, very firm; limited mobility with nodularity extending the upper vaginal fornices.  Vagina: no visible lesions, no discharge or bleeding but palpable nodularity as noted above Uterus: Not grossly enlarged. nontender, mobile BME: no palpable ovarian masses; suspect  disease is involving the left parametria but no extension to the sidewalls bilaterally.  Rectovaginal: confirmatory     Lab Review  Lab Results  Component Value Date   WBC 6.0 10/04/2020   HGB 12.9 10/04/2020   HCT 41.9 10/04/2020   MCV 87.7 10/04/2020   PLT 393 10/04/2020     Chemistry      Component Value Date/Time   NA 142 10/04/2020 1548   K 3.9 10/04/2020 1548   CL 106 10/04/2020 1548   CO2 24 10/04/2020 1548   BUN 17 10/04/2020 1548   CREATININE 2.55 (H) 10/04/2020 1548      Component Value Date/Time   CALCIUM 8.9 10/04/2020 1548   ALKPHOS 95 10/04/2020 1548   AST 31 10/04/2020 1548   ALT 20 10/04/2020 1548   BILITOT 0.4 10/04/2020  1548     Albumin 3.4   Radiologic Imaging: As per HPI; PET pending  10/01/2020 EXAM: DIGITAL DIAGNOSTIC UNILATERAL RIGHT MAMMOGRAM WITH TOMOSYNTHESIS AND CAD; ULTRASOUND RIGHT BREAST LIMITED  TECHNIQUE: Right digital diagnostic mammography and breast tomosynthesis was performed. The images were evaluated with computer-aided detection.; Targeted ultrasound examination of the right breast was performed  COMPARISON:  Previous exam(s).  ACR Breast Density Category b: There are scattered areas of fibroglandular density.  FINDINGS: Mammogram:  A skin BB marks the site of concern reported by the patient in the upper outer right breast. A spot tangential view of this area is performed in addition to standard views. There is a round circumscribed mass with central fatty hilum measuring approximately 1.2 cm at the palpable site, most likely an intramammary lymph node. There are no additional new findings elsewhere in the right breast.  On physical exam at site of concern reported by the patient I feel a discrete mass.  Ultrasound:  Targeted ultrasound is performed at the palpable site of concern in the right breast at 9:30 o'clock 9 cm from the nipple demonstrating a round circumscribed hypoechoic mass with central  fatty hilum measuring 1.3 x 1.3 x 1.1 cm, consistent with a intramammary lymph node. There is cortical thickening measuring 0.5 cm. This corresponds to the mammographic finding. Targeted ultrasound of the right axilla demonstrates normal lymph nodes.  IMPRESSION: Abnormal intramammary lymph node in the right breast at 9:30 o'clock at the palpable site reported by the patient.  RECOMMENDATION: Ultrasound-guided core needle biopsy of the right breast mass at 9:30 o'clock.  I have discussed the findings and recommendations with the patient who agrees to proceed with biopsy. The patient will be contacted by our scheduler to arrange the biopsy appointment.  BI-RADS CATEGORY  4: Suspicious.     Assessment:  MARIEELENA BARTKO is a 65 y.o. female with findings concerning for advanced gynecologic malignancy; abnormal pap and exam findings suspicious for cervical malignancy but this may also represent an advanced endometrial cancer - uterine is enlarged on imaging. Ovarian cancer less likely. Follow up PET scan once we have the biopsy results.   Elevated creatinine, uncertain etiology, no hydronephrosis on imaging.  Umbilical nodule with hernia noted on imaging   Breast mass  Hypoalbuminemia   Medical co-morbidities complicating care: HTN, obesity and prior abdominal surgery.    Plan:   Problem List Items Addressed This Visit   None   Visit Diagnoses    Cervical mass    -  Primary   Relevant Orders   Basic metabolic panel   Gynecologic malignancy ([place])       Elevated serum creatinine       Hypoalbuminemia         Arrange for radiologic guided biopsy. Cervical biopsy aborted in clinic as a gross friable lesion was not identified - suspect she has an endocervical mass. PET not approved by insurance until we have documentation of malignancy.  Recheck BMP if creatinine still elevated arrange for FENA and renal ultrasound.   Breast mass to be biopsied tomorrow.   Follow  up with PCP regarding HTN. She is asymptomatic and has no complaints of visual abnormalities, headache, chest pain, or stroke like symptoms.  The patient's diagnosis, an outline of the further diagnostic and laboratory studies which will be required, the recommendation, and alternatives were discussed.  All questions were answered to the patient's satisfaction.  A total of at least 80 minutes were spent caring for the patient  today; >50% was spent in education, counseling and coordination of care for presumed advanced gynecologic cancer, azotemia, breast mass, and hypoalbuminemia.   Zvi Duplantis Gaetana Michaelis, MD

## 2020-10-09 NOTE — H&P (Signed)
History and Physical   Tracy Goodman LNL:[id number] DOB: Nov 01, 1955 DOA: 10/09/2020  PCP: [name], MD  Outpatient Specialists: Dr. Janese Banks, medical oncology Patient coming from: Home  I have personally briefly reviewed patient's old medical records in [place].  Chief Concern: Abnormal labs  HPI: Tracy Goodman is a 65 y.o. 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  Patient states that she was told to come to the emergency department due to abnormal labs.  She states that otherwise she is her normal self.  She was diagnosed with cancer, likely cervical origin, September 23, 2020.  She states that she had been having abdominal pain at the time otherwise no other symptoms.  She denies any weight changes.  She states that she is urinating normally, denies dysuria, hematuria, chest pain, shortness of breath, back pain, weight changes, fever, chills, sick contacts.  She denies changes to her diet, and states that she drinks approximately 36 to 40 ounces of water per day.  Social history: She lives at home with her spouse and stepson.  She denies tobacco, EtOH, recreational drug use.  She works as a Teacher, early years/pre.  Vaccination: Patient is vaccinated for COVID-19, 2 doses of Pfizer, no booster.  ROS: Constitutional: no weight change, no fever ENT/Mouth: no sore throat, no rhinorrhea Eyes: no eye pain, no vision changes Cardiovascular: no chest pain, no dyspnea,  no edema, no palpitations Respiratory: no cough, no sputum, no wheezing Gastrointestinal: no nausea, no vomiting, no diarrhea, no constipation Genitourinary: no urinary incontinence, no dysuria, no hematuria Musculoskeletal: no arthralgias, no myalgias Skin: no skin lesions, no pruritus, Neuro: no weakness, no loss of consciousness, no syncope Psych: no  anxiety, no depression, no decrease appetite Heme/Lymph: no bruising, no bleeding  Assessment/Plan  Principal Problem:   AKI (acute kidney injury) ([place]) Active Problems:   Hyperlipidemia   Hypertension   Morbid obesity ([facility])   Umbilical hernia   Breast lump on right side at 10 o'clock position   Bilateral hydronephrosis   AKI - query atn vs ain vs glomerulonephritis, low clinical suspicion for cardiorenal - Serum creatinine from cancer center was 3.5 to, EGFR 14, baseline creatinine is 0.90-1.05 about 1 month to 7 months ago - Serum creatinine has been steadily increasing since about 9 days ago per lab, 1.979 days ago, 2.55, 5 days ago, to 3.52 today - Ultrasound of the kidneys showed mild bilateral renal obstruction likely due to distal urethral obstruction - Lactated Ringer's 100 mL/h, 10 hours ordered - BNP ordered, TSH, HIV, UA, urine osmolality, urine sodium - Stop inbuprofen  - Urology, Dr. Erlene Quan has been consulted and states she will see the patient - Urology requested patient be made n.p.o. after midnight - BMP in the a.m.  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 - Medical oncology team requesting omental biopsy -Medical oncology team discussed omental biopsy during this hospitalization with IR specialist and IR specialist and requested that this would be dependent on urologic intervention first - CT-guided biopsy order placed with comments stating that omental biopsy should happen after  urologic intervention  Hyperlipidemia- patient was prescribed atorvastatin 10 mg nightly, however she has not started this medication - We will not start  inpatient  Morbid obesity- outpatient management with PCP as appropriate  History of hypertension-patient is currently not on any antihypertensives -As needed hydralazine 25 mg every 6 hours as needed for SBP greater than 180, 3 doses ordered  DVT prophylaxis-TED hose only at this time, I have not started pharmacologic DVT prophylaxis as I anticipate urologic procedure on 10/10/2020 and possible omental CT-guided biopsy - A.m. team to start DVT prophylaxis when appropriate  Chart reviewed.   DVT prophylaxis: TED hose, pharmacologic DVT prophylaxis has not been initiated due to anticipation of urologic procedure and omental biopsy on 10/10/2020 Code Status: DNR, discussed extensively with patient regarding CODE STATUS and what it means including chest compressions, shock, possible intubation.  Patient understands clearly and spouse at bedside was part of the discussion and has heard her response multiple times clearly and respects her decision Diet: Heart healthy now, n.p.o. at midnight Family Communication: Updated spouse at bedside Disposition Plan: Pending clinical course Consults called: Urology Admission status: MedSurg, observation, with telemetry  Past Medical History:  Diagnosis Date  . Diverticulitis   . Hypertension    Past Surgical History:  Procedure Laterality Date  . CESAREAN SECTION    . WRIST FRACTURE SURGERY Left    Social History:  reports that she has never smoked. She has never used smokeless tobacco. She reports previous alcohol use. She reports that she does not use drugs.  No Known Allergies Family History  Problem Relation Age of Onset  . COPD Mother   . Heart failure Mother    Family history: Family history reviewed and not pertinent  Prior to Admission medications   Medication Sig Start Date End Date Taking? Authorizing Provider  atorvastatin (LIPITOR) 10 MG tablet Take 1 tablet (10 mg total) by mouth daily. Patient not taking: No sig reported 09/24/20    [name], MD  ibuprofen (ADVIL) 200 MG tablet Take 400 mg by mouth every 6 (six) hours as needed for fever or mild pain.    [provider]  Multiple Vitamin (MULTIVITAMIN WITH MINERALS) TABS tablet Take 1 tablet by mouth daily.    [provider]   Physical Exam: Vitals:   10/09/20 1800 10/09/20 2145  BP: (!) 192/111 (!) 167/97  Pulse: 87 80  Resp:  20  Temp: 97.9 F (36.6 C) (!) 97.5 F (36.4 C)  TempSrc: Oral Oral  SpO2:  100%   Constitutional: appears age-appropriate, NAD, calm, comfortable Eyes: PERRL, lids and conjunctivae normal ENMT: Mucous membranes are moist. Posterior pharynx clear of any exudate or lesions. Age-appropriate dentition. Hearing appropriate Neck: normal, supple, no masses, no thyromegaly Respiratory: clear to auscultation bilaterally, no wheezing, no crackles. Normal respiratory effort. No accessory muscle use.  Cardiovascular: Regular rate and rhythm, no murmurs / rubs / gallops. No extremity edema. 2+ pedal pulses. No carotid bruits.  Abdomen: Obese abdomen, no tenderness, no masses palpated, no hepatosplenomegaly. Bowel sounds positive.  Musculoskeletal: no clubbing / cyanosis. No joint deformity upper and lower extremities. Good ROM, no contractures, no atrophy. Normal muscle tone.  Skin: no rashes, lesions, ulcers. No induration Neurologic: Sensation intact. Strength 5/5 in all 4.  Psychiatric: Normal judgment and insight. Alert and oriented x 3. Normal mood.   EKG: independently reviewed, showing sinus rhythm with a rate of 91, QTc 418  Chest x-ray on Admission: Not indicated  US RENAL  Result Date: 10/09/2020 CLINICAL DATA:  Rising creatinine.  Advanced gyn malignancy EXAM: RENAL / URINARY TRACT ULTRASOUND COMPLETE COMPARISON:  CT abdomen pelvis 09/30/2020 FINDINGS: Right Kidney: Renal measurements: 10.8 x 5.2 x 5.6 cm = volume: 162 mL. Mild right hydronephrosis unchanged from prior CT. No renal mass. Left Kidney: Renal  measurements: 11.1 x 5.2 x 5.2 cm = volume: 156 mL. Mild left hydronephrosis unchanged from recent CT. No left renal mass. Bladder: Urinary jets not seen during 10 minutes of observation. Other: Trace ascites IMPRESSION: Mild bilateral renal obstruction likely due to distal ureteral obstruction. No change from recent CT. Electronically Signed   By: Franchot Gallo M.D.   On: 10/09/2020 16:17   Labs on Admission: I have personally reviewed following labs  CBC: Recent Labs  Lab 10/04/20 1548 10/09/20 1822  WBC 6.0 6.0  NEUTROABS 3.8 4.1  HGB 12.9 11.6*  HCT 41.9 38.4  MCV 87.7 86.7  PLT 393 750*   Basic Metabolic Panel: Recent Labs  Lab 10/04/20 1548 10/09/20 1101  NA 142 142  K 3.9 4.4  CL 106 105  CO2 24 24  GLUCOSE 99 105*  BUN 17 24*  CREATININE 2.55* 3.52*  CALCIUM 8.9 8.9   GFR: Estimated Creatinine Clearance: 18.2 mL/min (A) (by C-G formula based on SCr of 3.52 mg/dL (H)).  Liver Function Tests: Recent Labs  Lab 10/04/20 1548  AST 31  ALT 20  ALKPHOS 95  BILITOT 0.4  PROT 7.7  ALBUMIN 3.4*   Urine analysis:    Component Value Date/Time   COLORURINE STRAW (A) 10/09/2020 1850   APPEARANCEUR CLEAR (A) 10/09/2020 1850   LABSPEC 1.004 (L) 10/09/2020 1850   PHURINE 6.0 10/09/2020 1850   GLUCOSEU NEGATIVE 10/09/2020 1850   HGBUR SMALL (A) 10/09/2020 1850   BILIRUBINUR NEGATIVE 10/09/2020 1850   KETONESUR NEGATIVE 10/09/2020 1850   PROTEINUR NEGATIVE 10/09/2020 1850   NITRITE NEGATIVE 10/09/2020 1850   LEUKOCYTESUR NEGATIVE 10/09/2020 1850   Beadie Matsunaga N Myria Steenbergen D.O. [facility]  If 7PM-7AM, please contact overnight-coverage provider If 7AM-7PM, please contact day coverage provider [email or web address]  10/09/2020, 9:58 PM

## 2020-10-09 NOTE — Telephone Encounter (Signed)
Creatinine rising. Reviewed with Dr. Erlene Quan who recommends stat renal u/s. Patient will go to hospital for u/s now. Based on results, Dr. Janese Banks recommends hospitalization for management and also consider biopsy with IR while inpatient.

## 2020-10-10 ENCOUNTER — Observation Stay: Payer: BC Managed Care – PPO

## 2020-10-10 ENCOUNTER — Telehealth: Payer: Self-pay | Admitting: Oncology

## 2020-10-10 ENCOUNTER — Observation Stay: Payer: BC Managed Care – PPO | Admitting: Certified Registered Nurse Anesthetist

## 2020-10-10 ENCOUNTER — Encounter
Admission: AD | Disposition: A | Discharge status: Home / Self Care | Payer: Self-pay | Source: Ambulatory Visit | Attending: Internal Medicine

## 2020-10-10 ENCOUNTER — Ambulatory Visit: Admission: RE | Admit: 2020-10-10 | Payer: BC Managed Care – PPO | Source: Ambulatory Visit

## 2020-10-10 DIAGNOSIS — Z8249 Family history of ischemic heart disease and other diseases of the circulatory system: Secondary | ICD-10-CM | POA: Diagnosis not present

## 2020-10-10 DIAGNOSIS — N6311 Unspecified lump in the right breast, upper outer quadrant: Secondary | ICD-10-CM

## 2020-10-10 DIAGNOSIS — N368 Other specified disorders of urethra: Secondary | ICD-10-CM | POA: Diagnosis present

## 2020-10-10 DIAGNOSIS — N3289 Other specified disorders of bladder: Secondary | ICD-10-CM | POA: Diagnosis present

## 2020-10-10 DIAGNOSIS — N1330 Unspecified hydronephrosis: Secondary | ICD-10-CM | POA: Diagnosis not present

## 2020-10-10 DIAGNOSIS — C7911 Secondary malignant neoplasm of bladder: Secondary | ICD-10-CM | POA: Diagnosis not present

## 2020-10-10 DIAGNOSIS — N179 Acute kidney failure, unspecified: Principal | ICD-10-CM

## 2020-10-10 DIAGNOSIS — I10 Essential (primary) hypertension: Secondary | ICD-10-CM | POA: Diagnosis present

## 2020-10-10 DIAGNOSIS — N1339 Other hydronephrosis: Secondary | ICD-10-CM

## 2020-10-10 DIAGNOSIS — C577 Malignant neoplasm of other specified female genital organs: Secondary | ICD-10-CM | POA: Diagnosis not present

## 2020-10-10 DIAGNOSIS — E785 Hyperlipidemia, unspecified: Secondary | ICD-10-CM | POA: Diagnosis present

## 2020-10-10 DIAGNOSIS — C539 Malignant neoplasm of cervix uteri, unspecified: Secondary | ICD-10-CM | POA: Diagnosis present

## 2020-10-10 DIAGNOSIS — Z66 Do not resuscitate: Secondary | ICD-10-CM | POA: Diagnosis present

## 2020-10-10 DIAGNOSIS — N131 Hydronephrosis with ureteral stricture, not elsewhere classified: Secondary | ICD-10-CM | POA: Diagnosis present

## 2020-10-10 DIAGNOSIS — K429 Umbilical hernia without obstruction or gangrene: Secondary | ICD-10-CM | POA: Diagnosis present

## 2020-10-10 DIAGNOSIS — Z20822 Contact with and (suspected) exposure to covid-19: Secondary | ICD-10-CM | POA: Diagnosis present

## 2020-10-10 DIAGNOSIS — N141 Nephropathy induced by other drugs, medicaments and biological substances: Secondary | ICD-10-CM | POA: Diagnosis present

## 2020-10-10 DIAGNOSIS — Z6841 Body Mass Index (BMI) 40.0 and over, adult: Secondary | ICD-10-CM | POA: Diagnosis not present

## 2020-10-10 DIAGNOSIS — Z79899 Other long term (current) drug therapy: Secondary | ICD-10-CM | POA: Diagnosis not present

## 2020-10-10 HISTORY — PX: CYSTOSCOPY W/ URETERAL STENT PLACEMENT: SHX1429

## 2020-10-10 HISTORY — PX: FULGURATION OF BLADDER TUMOR: SHX6261

## 2020-10-10 LAB — CBC
HCT: 35.3 % — ABNORMAL LOW (ref 36.0–46.0)
Hemoglobin: 10.9 g/dL — ABNORMAL LOW (ref 12.0–15.0)
MCH: 26.5 pg (ref 26.0–34.0)
MCHC: 30.9 g/dL (ref 30.0–36.0)
MCV: 85.9 fL (ref 80.0–100.0)
Platelets: 413 10*3/uL — ABNORMAL HIGH (ref 150–400)
RBC: 4.11 MIL/uL (ref 3.87–5.11)
RDW: 13.3 % (ref 11.5–15.5)
WBC: 6.9 10*3/uL (ref 4.0–10.5)
nRBC: 0 % (ref 0.0–0.2)

## 2020-10-10 LAB — BASIC METABOLIC PANEL
Anion gap: 10 (ref 5–15)
BUN: 23 mg/dL (ref 8–23)
CO2: 22 mmol/L (ref 22–32)
Calcium: 8.6 mg/dL — ABNORMAL LOW (ref 8.9–10.3)
Chloride: 105 mmol/L (ref 98–111)
Creatinine, Ser: 3.41 mg/dL — ABNORMAL HIGH (ref 0.44–1.00)
GFR, Estimated: 15 mL/min — ABNORMAL LOW (ref >60)
Glucose, Bld: 94 mg/dL (ref 70–99)
Potassium: 4 mmol/L (ref 3.5–5.1)
Sodium: 137 mmol/L (ref 135–145)

## 2020-10-10 LAB — URINALYSIS, COMPLETE (UACMP) WITH MICROSCOPIC
Bilirubin Urine: NEGATIVE
Glucose, UA: NEGATIVE mg/dL
Ketones, ur: NEGATIVE mg/dL
Nitrite: NEGATIVE
Protein, ur: NEGATIVE mg/dL
Specific Gravity, Urine: 1.003 — ABNORMAL LOW (ref 1.005–1.030)
pH: 6 (ref 5.0–8.0)

## 2020-10-10 LAB — CREATININE, URINE, RANDOM: Creatinine, Urine: 51 mg/dL

## 2020-10-10 LAB — SARS CORONAVIRUS 2 (TAT 6-24 HRS): SARS Coronavirus 2: NEGATIVE

## 2020-10-10 LAB — SODIUM, URINE, RANDOM: Sodium, Ur: 16 mmol/L

## 2020-10-10 SURGERY — CYSTOSCOPY, WITH RETROGRADE PYELOGRAM AND URETERAL STENT INSERTION
Anesthesia: General

## 2020-10-10 MED ORDER — OXYBUTYNIN CHLORIDE 5 MG PO TABS: 5.0000 mg | ORAL_TABLET | Freq: Three times a day (TID) | ORAL | Status: DC | PRN

## 2020-10-10 MED ORDER — FENTANYL CITRATE (PF) 100 MCG/2ML IJ SOLN
INTRAMUSCULAR | Status: DC | PRN
  Administered 2020-10-10: 100 ug via INTRAVENOUS

## 2020-10-10 MED ORDER — SODIUM CHLORIDE 0.9 % IV SOLN: INTRAVENOUS | Status: DC

## 2020-10-10 MED ORDER — DEXTROSE 5 % IV SOLN
INTRAVENOUS | Status: DC | PRN
  Administered 2020-10-10: 3 g via INTRAVENOUS

## 2020-10-10 MED ORDER — LIDOCAINE HCL (CARDIAC) PF 100 MG/5ML IV SOSY
PREFILLED_SYRINGE | INTRAVENOUS | Status: DC | PRN
  Administered 2020-10-10: 100 mg via INTRAVENOUS

## 2020-10-10 MED ORDER — CEFAZOLIN SODIUM-DEXTROSE 2-4 GM/100ML-% IV SOLN
2.0000 g | INTRAVENOUS | Status: AC
  Filled 2020-10-10: qty 100

## 2020-10-10 MED ORDER — ONDANSETRON HCL 4 MG/2ML IJ SOLN
INTRAMUSCULAR | Status: DC | PRN
  Administered 2020-10-10: 4 mg via INTRAVENOUS

## 2020-10-10 MED ORDER — ONDANSETRON HCL 4 MG/2ML IJ SOLN
4.0000 mg | Freq: Once | INTRAMUSCULAR | Status: DC | PRN
Start: 2020-10-10 — End: 2020-10-10

## 2020-10-10 MED ORDER — ROCURONIUM BROMIDE 100 MG/10ML IV SOLN
INTRAVENOUS | Status: DC | PRN
  Administered 2020-10-10: 50 mg via INTRAVENOUS

## 2020-10-10 MED ORDER — LACTATED RINGERS IV SOLN: INTRAVENOUS | Status: DC | PRN

## 2020-10-10 MED ORDER — ESMOLOL HCL 100 MG/10ML IV SOLN
INTRAVENOUS | Status: DC | PRN
  Administered 2020-10-10: 30 mg via INTRAVENOUS

## 2020-10-10 MED ORDER — HYDROCODONE-ACETAMINOPHEN 7.5-325 MG PO TABS: 1.0000 | ORAL_TABLET | Freq: Once | ORAL | Status: DC | PRN

## 2020-10-10 MED ORDER — AMLODIPINE BESYLATE 5 MG PO TABS
5.0000 mg | ORAL_TABLET | Freq: Every day | ORAL | Status: DC
  Administered 2020-10-10 – 2020-10-11 (×2): 5 mg via ORAL
  Filled 2020-10-10 (×2): qty 1

## 2020-10-10 MED ORDER — HEPARIN SODIUM (PORCINE) 5000 UNIT/ML IJ SOLN
5000.0000 [IU] | Freq: Three times a day (TID) | INTRAMUSCULAR | Status: DC
  Administered 2020-10-10 – 2020-10-11 (×2): 5000 [IU] via SUBCUTANEOUS
  Filled 2020-10-10 (×2): qty 1

## 2020-10-10 MED ORDER — FENTANYL CITRATE (PF) 100 MCG/2ML IJ SOLN
INTRAMUSCULAR | Status: AC
  Filled 2020-10-10: qty 2

## 2020-10-10 MED ORDER — PROPOFOL 10 MG/ML IV BOLUS
INTRAVENOUS | Status: DC | PRN
  Administered 2020-10-10: 200 mg via INTRAVENOUS

## 2020-10-10 MED ORDER — FENTANYL CITRATE (PF) 100 MCG/2ML IJ SOLN: 25.0000 ug | INTRAMUSCULAR | Status: DC | PRN

## 2020-10-10 MED ORDER — ACETAMINOPHEN 160 MG/5ML PO SOLN
325.0000 mg | ORAL | Status: DC | PRN
  Filled 2020-10-10: qty 20.3

## 2020-10-10 MED ORDER — PROPOFOL 10 MG/ML IV BOLUS
INTRAVENOUS | Status: AC
  Filled 2020-10-10: qty 40

## 2020-10-10 MED ORDER — SUGAMMADEX SODIUM 200 MG/2ML IV SOLN
INTRAVENOUS | Status: DC | PRN
  Administered 2020-10-10: 400 mg via INTRAVENOUS

## 2020-10-10 MED ORDER — ACETAMINOPHEN 325 MG PO TABS: 325.0000 mg | ORAL_TABLET | ORAL | Status: DC | PRN

## 2020-10-10 MED ORDER — HYDRALAZINE HCL 20 MG/ML IJ SOLN
10.0000 mg | Freq: Four times a day (QID) | INTRAMUSCULAR | Status: DC | PRN
  Filled 2020-10-10: qty 0.5

## 2020-10-10 MED ORDER — BELLADONNA ALKALOIDS-OPIUM 16.2-60 MG RE SUPP: 1.0000 | Freq: Three times a day (TID) | RECTAL | Status: DC | PRN

## 2020-10-10 MED ORDER — IOPAMIDOL (ISOVUE-200) INJECTION 41%
INTRAVENOUS | Status: DC | PRN
  Administered 2020-10-10: 20 mL

## 2020-10-10 SURGICAL SUPPLY — 24 items
BAG DRAIN CYSTO-URO LG1000N (MISCELLANEOUS) ×3 IMPLANT
BRUSH SCRUB EZ 1% IODOPHOR (MISCELLANEOUS) ×3 IMPLANT
CATH URET FLEX-TIP 2 LUMEN 10F (CATHETERS) IMPLANT
CATH URETL 5X70 OPEN END (CATHETERS) ×3 IMPLANT
DRAPE UTILITY 15X26 TOWEL STRL (DRAPES) ×3 IMPLANT
DRSG TELFA 4X3 1S NADH ST (GAUZE/BANDAGES/DRESSINGS) ×3 IMPLANT
ELECT REM PT RETURN 9FT ADLT (ELECTROSURGICAL) ×3
ELECT URO BUGBEE 5F (MISCELLANEOUS) ×3
ELECTRODE REM PT RTRN 9FT ADLT (ELECTROSURGICAL) ×2 IMPLANT
ELECTRODE URO BUGBEE 5F (MISCELLANEOUS) ×2 IMPLANT
GLOVE SURG ENC MOIS LTX SZ6.5 (GLOVE) ×3 IMPLANT
GOWN STRL REUS W/ TWL LRG LVL3 (GOWN DISPOSABLE) ×4 IMPLANT
GOWN STRL REUS W/TWL LRG LVL3 (GOWN DISPOSABLE) ×6
GUIDEWIRE STR DUAL SENSOR (WIRE) ×3 IMPLANT
INFUSOR MANOMETER BAG 3000ML (MISCELLANEOUS) ×3 IMPLANT
IV NS IRRIG 3000ML ARTHROMATIC (IV SOLUTION) ×3 IMPLANT
KIT TURNOVER CYSTO (KITS) ×3 IMPLANT
MANIFOLD NEPTUNE II (INSTRUMENTS) ×3 IMPLANT
PACK CYSTO AR (MISCELLANEOUS) ×3 IMPLANT
SET CYSTO W/LG BORE CLAMP LF (SET/KITS/TRAYS/PACK) ×3 IMPLANT
STENT URO INLAY 6FRX22CM (STENTS) ×6 IMPLANT
SURGILUBE 2OZ TUBE FLIPTOP (MISCELLANEOUS) ×3 IMPLANT
TRACTIP FLEXIVA PULSE ID 200 (Laser) ×3 IMPLANT
WATER STERILE IRR 1000ML POUR (IV SOLUTION) ×3 IMPLANT

## 2020-10-10 NOTE — Anesthesia Preprocedure Evaluation (Addendum)
Anesthesia Evaluation  Patient identified by MRN, date of birth, ID band Patient awake    Reviewed: Allergy & Precautions, H&P , NPO status , reviewed documented beta blocker date and time   Airway Mallampati: IV  TM Distance: >3 FB Neck ROM: full    Dental  (+) Partial Upper, Dental Advidsory Given   Pulmonary    Pulmonary exam normal        Cardiovascular hypertension, Normal cardiovascular exam  NSR on EKG   Neuro/Psych    GI/Hepatic neg GERD  ,  Endo/Other  Morbid obesity  Renal/GU Renal disease     Musculoskeletal   Abdominal   Peds  Hematology   Anesthesia Other Findings Past Medical History: No date: Diverticulitis No date: Hypertension Past Surgical History: No date: CESAREAN SECTION No date: WRIST FRACTURE SURGERY; Left BMI    Body Mass Index: 48.06 kg/m     Reproductive/Obstetrics                            Anesthesia Physical Anesthesia Plan  ASA: III  Anesthesia Plan: General   Post-op Pain Management:    Induction: Intravenous  PONV Risk Score and Plan: 3 and Ondansetron, Midazolam and Treatment may vary due to age or medical condition  Airway Management Planned: Oral ETT  Additional Equipment:   Intra-op Plan:   Post-operative Plan: Extubation in OR  Informed Consent: I have reviewed the patients History and Physical, chart, labs and discussed the procedure including the risks, benefits and alternatives for the proposed anesthesia with the patient or authorized representative who has indicated his/her understanding and acceptance.     Dental Advisory Given  Plan Discussed with: CRNA  Anesthesia Plan Comments:        Anesthesia Quick Evaluation

## 2020-10-10 NOTE — Transfer of Care (Signed)
Immediate Anesthesia Transfer of Care Note  Patient: Tracy Goodman  Procedure(s) Performed: CYSTOSCOPY WITH RETROGRADE PYELOGRAM/URETERAL STENT PLACEMENT (Bilateral ) FULGURATION OF BLADDER TUMOR (N/A )  Patient Location: PACU  Anesthesia Type:General  Level of Consciousness: awake, alert  and oriented  Airway & Oxygen Therapy: Patient Spontanous Breathing  Post-op Assessment: Report given to RN and Post -op Vital signs reviewed and stable  Post vital signs: Reviewed and stable  Last Vitals:  Vitals Value Taken Time  BP 160/132 10/10/20 1331  Temp 36.3 C 10/10/20 1330  Pulse 96 10/10/20 1337  Resp 34 10/10/20 1337  SpO2 93 % 10/10/20 1337    Last Pain:  Vitals:   10/10/20 1330  TempSrc:   PainSc: 0-No pain         Complications: No complications documented.

## 2020-10-10 NOTE — Consult Note (Signed)
Urology Consult  I have been asked to see the patient by Dr. Janese Banks, for evaluation and management of acute renal failure.  Chief Complaint: Elevated creatinine in routine labs  History of Present Illness: Tracy Goodman is a 65 y.o. 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  Renal ultrasound was performed yesterday which revealed mild bilateral hydronephrosis believed secondary to distal ureteral obstruction.  Creatinine stable today, 3.41.  Patient is accompanied today at the bedside by her husband, who contributes to HPI.  She denies recent flank pain but reports urinary frequency every 1-2 hours without dysuria.  She reports she has changed her mind about her CODE STATUS and does NOT wish to be DNR.  She has been n.p.o. since midnight.  Past Medical History:  Diagnosis Date  . Diverticulitis   . Hypertension     Past Surgical History:  Procedure Laterality Date  . CESAREAN SECTION    . WRIST FRACTURE SURGERY Left     Home Medications:  Current Meds  Medication Sig  . ibuprofen (ADVIL) 200 MG tablet Take 400 mg by mouth every 6 (six) hours as needed for fever or mild pain.  . Multiple Vitamin (MULTIVITAMIN WITH MINERALS) TABS tablet Take 1 tablet by mouth daily.    Allergies: No Known Allergies  Family History  Problem Relation Age of Onset  . COPD Mother   . Heart failure Mother     Social History:  reports that she has never smoked. She has never used smokeless tobacco. She reports previous alcohol use. She reports that she does not use drugs.  ROS: A complete review of systems was performed.  All systems are negative except for pertinent findings as noted.  Physical Exam:  Vital signs in last 24 hours: Temp:  [97.5 F (36.4 C)-98.6 F (37 C)] 98.4 F (36.9 C) (04/21 0808) Pulse Rate:  [80-103] 92 (04/21  0808) Resp:  [15-20] 15 (04/21 0808) BP: (158-192)/(83-111) 164/83 (04/21 0808) SpO2:  [97 %-100 %] 99 % (04/21 0808) Weight:  [108 kg-111 kg] 108 kg (04/21 0000) Constitutional:  Alert and oriented, no acute distress HEENT: [place] AT, moist mucus membranes Cardiovascular: No clubbing, cyanosis, or edema Respiratory: Normal respiratory effort Skin: No rashes, bruises or suspicious lesions Neurologic: Grossly intact, no focal deficits, moving all 4 extremities Psychiatric: Normal mood and affect  Laboratory Data:  Recent Labs    10/09/20 1822 10/10/20 0322  WBC 6.0 6.9  HGB 11.6* 10.9*  HCT 38.4 35.3*   Recent Labs    10/09/20 1101 10/10/20 0322  NA 142 137  K 4.4 4.0  CL 105 105  CO2 24 22  GLUCOSE 105* 94  BUN 24* 23  CREATININE 3.52* 3.41*  CALCIUM 8.9 8.6*   Urinalysis    Component Value Date/Time   COLORURINE STRAW (A) 10/09/2020 1850   APPEARANCEUR CLEAR (A) 10/09/2020 1850   LABSPEC 1.004 (L) 10/09/2020 1850   PHURINE 6.0 10/09/2020 1850   GLUCOSEU NEGATIVE 10/09/2020 1850   HGBUR SMALL (A) 10/09/2020 1850   BILIRUBINUR NEGATIVE 10/09/2020 1850   KETONESUR NEGATIVE 10/09/2020 1850   PROTEINUR NEGATIVE 10/09/2020 1850   NITRITE NEGATIVE 10/09/2020 1850   LEUKOCYTESUR NEGATIVE 10/09/2020 1850   Results for orders placed or performed during the hospital encounter of 10/09/20  SARS CORONAVIRUS 2 (TAT 6-24 HRS) Nasopharyngeal Nasopharyngeal Swab     Status: None  Collection Time: 10/09/20  7:55 PM   Specimen: Nasopharyngeal Swab  Result Value Ref Range Status   SARS Coronavirus 2 NEGATIVE NEGATIVE Final    Comment: (NOTE) SARS-CoV-2 target nucleic acids are NOT DETECTED.  The SARS-CoV-2 RNA is generally detectable in upper and lower respiratory specimens during the acute phase of infection. Negative results do not preclude SARS-CoV-2 infection, do not rule out co-infections with other pathogens, and should not be used as the sole basis for treatment or  other patient management decisions. Negative results must be combined with clinical observations, patient history, and epidemiological information. The expected result is Negative.  Fact Sheet for Patients: SugarRoll.be  Fact Sheet for Healthcare Providers: https://www.woods-mathews.com/  This test is not yet approved or cleared by the [place] FDA and  has been authorized for detection and/or diagnosis of SARS-CoV-2 by FDA under an Emergency Use Authorization (EUA). This EUA will remain  in effect (meaning this test can be used) for the duration of the COVID-19 declaration under Se ction 564(b)(1) of the Act, 21 U.S.C. section 360bbb-3(b)(1), unless the authorization is terminated or revoked sooner.  Performed at [facility], [place] [address], [place], [place] [address]     Radiologic Imaging: US RENAL  Result Date: 10/09/2020 CLINICAL DATA:  Rising creatinine.  Advanced gyn malignancy EXAM: RENAL / URINARY TRACT ULTRASOUND COMPLETE COMPARISON:  CT abdomen pelvis 09/30/2020 FINDINGS: Right Kidney: Renal measurements: 10.8 x 5.2 x 5.6 cm = volume: 162 mL. Mild right hydronephrosis unchanged from prior CT. No renal mass. Left Kidney: Renal measurements: 11.1 x 5.2 x 5.2 cm = volume: 156 mL. Mild left hydronephrosis unchanged from recent CT. No left renal mass. Bladder: Urinary jets not seen during 10 minutes of observation. Other: Trace ascites IMPRESSION: Mild bilateral renal obstruction likely due to distal ureteral obstruction. No change from recent CT. Electronically Signed   By: Franchot Gallo M.D.   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  I explained to the patient and her husband at the bedside today that we  recommend treatment to relieve her urinary obstruction and that in the absence of intervention, she may suffer permanent kidney damage or renal failure.  I explained that the treatment options at this point include bilateral ureteral stent placement versus bilateral nephrostomy tube placement I explained that bilateral ureteral stents are completely internal and do not require daily management, however they can carry a risk of flank and/or bladder discomfort.  Alternatively, nephrostomy tubes would require daily maintenance and emptying of her drainage bags, however they do not carry the risk of stent discomfort.  Based on this conversation, patient wishes to proceed with bilateral ureteral stent placement.  I am in agreement with this plan.  Please keep the patient n.p.o. in advance of planned cystoscopy and bilateral ureteral stent placement with Dr. Erlene Quan in the [facility] today.  Additionally, I recommend revising her CODE STATUS to no longer reflect DNR, as patient has changed her mind about this.  Thank you for involving me in this patient's care, I will continue to follow along.  Debroah Loop, PA-C 10/10/2020 8:51 AM

## 2020-10-10 NOTE — Anesthesia Procedure Notes (Signed)
Procedure Name: Intubation Date/Time: 10/10/2020 12:39 PM Performed by: Louann Sjogren, CRNA Pre-anesthesia Checklist: Patient identified, Patient being monitored, Timeout performed, Emergency Drugs available and Suction available Patient Re-evaluated:Patient Re-evaluated prior to induction Oxygen Delivery Method: Circle system utilized Preoxygenation: Pre-oxygenation with 100% oxygen Induction Type: IV induction Ventilation: Mask ventilation without difficulty Laryngoscope Size: Mac and 4 Grade View: Grade I Tube type: Oral Tube size: 7.0 mm Number of attempts: 1 Airway Equipment and Method: Stylet Placement Confirmation: ETT inserted through vocal cords under direct vision,  positive ETCO2 and breath sounds checked- equal and bilateral Secured at: 21 cm Tube secured with: Tape Dental Injury: Teeth and Oropharynx as per pre-operative assessment

## 2020-10-10 NOTE — Consult Note (Signed)
Hematology/Oncology Consult note [facility] Telephone:([phone] [phone]  Patient Care Team: [name], MD as PCP - General (Family Medicine)   Name of the patient: Tracy Goodman  [id number]  1956/05/25    Reason for consult: Metastatic GYN malignancy   Requesting physician: Dr. 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 Cervical biopsy X 1 was non diagnostic.  Most recent CT scan on 09/30/2020 revealed evidence of pelvic and retroperitoneal adenopathy as well as malignant ascites and omental caking.  Cervix is a masslike appearance.  Cervical biopsy done by GYN was nondiagnostic.  She was seen by me as an outpatient and labs on that day revealed evidence of AKI which was worsened a week after.  She was therefore admitted to rule out obstruction and for IV hydration.  She was noted to have mild bilateral hydronephrosis on ultrasound right greater than left.  She was seen by urology today and underwent bilateral ureteral stent placements.  She was noted to have moderate right hydronephrosis and minimal left hydronephrosis with evidence of tumor in the posterior bladder wall which appeared to arise extrinsically from the bladder.  This was biopsied.   ECOG PS- 1  Pain scale- 0   Review of systems- Review of Systems  Constitutional: Positive for malaise/fatigue. Negative for chills, fever and weight loss.  HENT: Negative for congestion, ear discharge and nosebleeds.   Eyes: Negative for blurred vision.  Respiratory: Negative for cough, hemoptysis, sputum production, shortness of breath and wheezing.   Cardiovascular: Negative for chest pain, palpitations, orthopnea and claudication.  Gastrointestinal: Negative for abdominal pain, blood in stool, constipation, diarrhea, heartburn,  melena, nausea and vomiting.  Genitourinary: Negative for dysuria, flank pain, frequency, hematuria and urgency.  Musculoskeletal: Negative for back pain, joint pain and myalgias.  Skin: Negative for rash.  Neurological: Negative for dizziness, tingling, focal weakness, seizures, weakness and headaches.  Endo/Heme/Allergies: Does not bruise/bleed easily.  Psychiatric/Behavioral: Negative for depression and suicidal ideas. The patient does not have insomnia.     No Known Allergies  Patient Active Problem List   Diagnosis Date Noted  . AKI (acute kidney injury) ([place]) 10/09/2020  . Bilateral hydronephrosis 10/09/2020  . Goals of care, counseling/discussion 10/06/2020  . Breast lump on right side at 10 o'clock position 09/23/2020  . Diverticulitis 09/23/2020  . Umbilical hernia 76/28/3151  . Enlarged thyroid gland 03/12/2020  . Hyperlipidemia 07/13/2016  . Hypertension 06/10/2016  . Morbid obesity ([place]) 06/10/2016     Past Medical History:  Diagnosis Date  . Diverticulitis   . Hypertension      Past Surgical History:  Procedure Laterality Date  . CESAREAN SECTION    . WRIST FRACTURE SURGERY Left     Social History   Socioeconomic History  . Marital status: Married    Spouse name: Tempie Donning  . Number of children: 3  . Years of education: high school  . Highest education level: Not on file  Occupational History  . Not on file  Tobacco Use  . Smoking status: Never Smoker  . Smokeless tobacco: Never Used  Substance and Sexual Activity  . Alcohol use: Not Currently  . Drug use: Never  . Sexual activity: Yes    Birth control/protection: Post-menopausal  Other Topics Concern  . Not on file  Social History Narrative   03/12/20   From: [place]  area   Living: with Tempie Donning, husband (2012) and one step son   Work: school bus driver      Family: 3 adult children - [place], [place], [place]il[place] Drummer - 6-7 grandchildren      Enjoys: singing in the choir at church      Exercise: weight  lifting in the morning, stretches   Diet: varied, tries to eat healthy - baked salmon and fish/chicken/fried food      Safety   Seat belts: Yes    Guns: No   Safe in relationships: Yes    Social Determinants of Radio broadcast assistant Strain: Not on file  Food Insecurity: Not on file  Transportation Needs: Not on file  Physical Activity: Not on file  Stress: Not on file  Social Connections: Not on file  Intimate Partner Violence: Not on file     Family History  Problem Relation Age of Onset  . COPD Mother   . Heart failure Mother      Current Facility-Administered Medications:  .  0.9 %  sodium chloride infusion, , Intravenous, Continuous, Hollice Espy, MD, Last Rate: 75 mL/hr at 10/10/20 0739, New Bag at 10/10/20 0739 .  acetaminophen (TYLENOL) tablet 650 mg, 650 mg, Oral, Q6H PRN **OR** acetaminophen (TYLENOL) suppository 650 mg, 650 mg, Rectal, Q6H PRN, Hollice Espy, MD .  amLODipine (NORVASC) tablet 5 mg, 5 mg, Oral, Daily, Elgergawy, Silver Huguenin, MD .  belladonna-opium (B&O) suppository 16.2-60mg , 1 suppository, Rectal, Q8H PRN, Hollice Espy, MD .  ceFAZolin (ANCEF) IVPB 2g/100 mL premix, 2 g, Intravenous, On Call to OR, Hollice Espy, MD .  heparin injection 5,000 Units, 5,000 Units, Subcutaneous, Q8H, Elgergawy, Silver Huguenin, MD .  hydrALAZINE (APRESOLINE) injection 10 mg, 10 mg, Intravenous, Q6H PRN, Hollice Espy, MD .  hydrALAZINE (APRESOLINE) tablet 25 mg, 25 mg, Oral, Q6H PRN, Hollice Espy, MD, 25 mg at 10/10/20 0013 .  multivitamin with minerals tablet 1 tablet, 1 tablet, Oral, Daily, Hollice Espy, MD, 1 tablet at 10/10/20 0012 .  ondansetron (ZOFRAN) tablet 4 mg, 4 mg, Oral, Q6H PRN **OR** ondansetron (ZOFRAN) injection 4 mg, 4 mg, Intravenous, Q6H PRN, Hollice Espy, MD .  oxybutynin (DITROPAN) tablet 5 mg, 5 mg, Oral, Q8H PRN, Hollice Espy, MD .  traMADol Veatrice Bourbon) tablet 50 mg, 50 mg, Oral, Q12H PRN, Hollice Espy, MD   Physical exam:   Vitals:   10/10/20 1415 10/10/20 1430 10/10/20 1445 10/10/20 1517  BP: (!) 159/97 (!) 175/86 (!) 193/130 (!) 160/80  Pulse: 94 82 87   Resp: (!) 26 19 (!) 25 16  Temp:   97.6 F (36.4 C) 97.9 F (36.6 C)  TempSrc:    Oral  SpO2: 95% 95% 94% 95%  Weight:      Height:       Physical Exam Constitutional:      General: She is not in acute distress.    Appearance: She is obese.  Cardiovascular:     Rate and Rhythm: Normal rate and regular rhythm.     Heart sounds: Normal heart sounds.  Pulmonary:     Effort: Pulmonary effort is normal.     Breath sounds: Normal breath sounds.  Abdominal:     General: Bowel sounds are normal.     Palpations: Abdomen is soft.  Skin:    General: Skin is warm and dry.  Neurological:     Mental Status: She is alert and oriented to person, place, and time.  CMP Latest Ref Rng & Units 10/10/2020  Glucose 70 - 99 mg/dL 94  BUN 8 - 23 mg/dL 23  Creatinine 0.44 - 1.00 mg/dL 3.41(H)  Sodium 135 - 145 mmol/L 137  Potassium 3.5 - 5.1 mmol/L 4.0  Chloride 98 - 111 mmol/L 105  CO2 22 - 32 mmol/L 22  Calcium 8.9 - 10.3 mg/dL 8.6(L)  Total Protein 6.5 - 8.1 g/dL -  Total Bilirubin 0.3 - 1.2 mg/dL -  Alkaline Phos 38 - 126 U/L -  AST 15 - 41 U/L -  ALT 0 - 44 U/L -   CBC Latest Ref Rng & Units 10/10/2020  WBC 4.0 - 10.5 K/uL 6.9  Hemoglobin 12.0 - 15.0 g/dL 10.9(L)  Hematocrit 36.0 - 46.0 % 35.3(L)  Platelets 150 - 400 K/uL 413(H)    @IMAGES @  CT ABDOMEN PELVIS WO CONTRAST  Result Date: 09/30/2020 CLINICAL DATA:  65 year old female with abdominal pain greater on the left. Recent diverticulitis status post full course of antibiotics. Two episodes of bright red blood in stool yesterday. EXAM: CT ABDOMEN AND PELVIS WITHOUT CONTRAST TECHNIQUE: Multidetector CT imaging of the abdomen and pelvis was performed following the standard protocol without IV contrast. COMPARISON:  CT Abdomen and Pelvis 09/04/2020. FINDINGS: Lower chest: Small layering  left pleural effusion is new since last month. No cardiomegaly or pericardial effusion. Mild lung base atelectasis. Hepatobiliary: Trace perihepatic free fluid is new since last month. Otherwise noncontrast liver and gallbladder appear stable. Pancreas: Negative. Spleen: Small volume perisplenic free fluid is new since last month. Simple fluid density. Otherwise stable noncontrast spleen. Adrenals/Urinary Tract: Normal adrenal glands. New bilateral hydronephrosis (series 2, image 32). No nephrolithiasis. Both ureters are difficult to delineate at the pelvic inlet where to generalized increased inflammation is noted from last month. Urinary bladder is diminutive. Stomach/Bowel: New free fluid layering in both gutters greater on the left. Simple fluid density on that side (coronal image 90). Generalized mesenteric stranding in the distal omentum (coronal image 74 and series 2, image 64) superficial to the uterus and adnexa which are abnormal, described below. No dilated small or large bowel loops. Diverticulosis of the large bowel. Sigmoid colon is indistinct and inseparable from the uterine fundus on sagittal image 116. No pneumoperitoneum is identified. Negative terminal ileum. Appendix cannot be delineated but likely courses toward the uterine fundus. Decompressed stomach and duodenum. Vascular/Lymphatic: Aortoiliac calcified atherosclerosis. Normal caliber abdominal aorta. Vascular patency is not evaluated in the absence of IV contrast. Reproductive: Moderate generalized parametrial inflammation has increased (series 2, image 63). Continued gas in the vagina. No gas identified within the endometrium. Other: Increased presacral stranding since last month. No layering pelvic free fluid. Small fat containing inguinal hernias are stable. Musculoskeletal: Lumbar facet arthropathy. No acute osseous abnormality identified. IMPRESSION: 1. Small volume new free fluid in the abdomen and pelvis. No free air identified.  Increased and generalized inflammation at the pelvic inlet, in the distal omentum, and about the uterus and ovaries. Sigmoid colon appears indistinct and inseparable from the uterine fundus, with persistent gas within the vagina. Appendix not delineated. New bilateral hydronephrosis. 2. Recommend repeat CT Abdomen and Pelvis with oral and IV contrast to further characterize (and recommend sufficient oral contrast and timing delay to ensure contrast has reached the sigmoid colon). 3. Small new layering left pleural effusion. Electronically Signed   By: Genevie Ann M.D.   On: 09/30/2020 06:28   CT ABDOMEN PELVIS W CONTRAST  Result Date: 09/30/2020 CLINICAL DATA:  65 year old female  with history of left lower abdominal pain. Suspected abdominal abscess or infection. EXAM: CT ABDOMEN AND PELVIS WITH CONTRAST TECHNIQUE: Multidetector CT imaging of the abdomen and pelvis was performed using the standard protocol following bolus administration of intravenous contrast. CONTRAST:  80mL OMNIPAQUE IOHEXOL 300 MG/ML  SOLN COMPARISON:  CT of the abdomen and pelvis 10/01/2018 to. FINDINGS: Lower chest: Small left pleural effusion lying dependently with some associated passive subsegmental atelectasis in the left lower lobe. Scarring or subsegmental atelectasis in the right middle lobe also noted. Hepatobiliary: No suspicious cystic or solid hepatic lesions. No intra or extrahepatic biliary ductal dilatation. Gallbladder is normal in appearance. Pancreas: Fatty infiltration in the pancreas. No pancreatic mass. No pancreatic ductal dilatation. No pancreatic or peripancreatic fluid collections or inflammatory changes. Spleen: Unremarkable. Adrenals/Urinary Tract: Bilateral kidneys and adrenal glands are normal in appearance. No hydroureteronephrosis. Urinary bladder is nearly completely collapsed, but otherwise unremarkable in appearance. Stomach/Bowel: Stomach is completely decompressed, but otherwise unremarkable in appearance. No  pathologic dilatation of small bowel or colon. Numerous colonic diverticulae are noted, without surrounding inflammatory changes to clearly indicate an acute diverticulitis at this time. Appendix is not confidently identified may be surgically absent. Vascular/Lymphatic: Atherosclerosis in the pelvic vasculature. No aneurysm or dissection noted in the abdominal or pelvic vasculature. Multiple prominent borderline enlarged and mildly enlarged retroperitoneal and pelvic lymph nodes. Specific examples include a left para-aortic lymph node measuring 1 cm in short axis adjacent to the left renal hilum (axial image 33 of series 2), 1.3 cm in short axis in the left para-aortic nodal station inferior to the left renal hilum (axial image 40 of series 2), 1 cm in short axis in the right external iliac nodal station (axial images 62 and 64 of series 2) and 1.3 cm in short axis in the right common femoral nodal station (axial image 75 of series 2). Borderline enlarged mesorectal lymph node on the right (axial image 75 of series 2) measuring 9 mm in short axis. Reproductive: Mass-like enlargement of the cervix measuring 3.8 x 3.0 x 2.9 cm. Remainder of the uterus and ovaries are otherwise unremarkable in appearance. Other: Trace volume of ascites. Extensive omental nodularity, most notable in the low anatomic pelvis. Bilateral inguinal hernias containing predominantly fat, although some of the lower omental nodularity extends into the right inguinal hernia. Small umbilical hernia containing omental fat. Trace volume of ascites. No pneumoperitoneum. Musculoskeletal: There are no aggressive appearing lytic or blastic lesions noted in the visualized portions of the skeleton. IMPRESSION: 1. No intra-abdominal abscess. 2. There is evidence of intra-abdominal malignancy with pelvic and retroperitoneal lymphadenopathy, as well as probable intraperitoneal metastatic disease best demonstrated by omental caking and small volume of  presumably malignant ascites. Notably, the cervix has a mass-like appearance. Further evaluation with pelvic examination is recommended to exclude gynecologic malignancy. 3. Small left pleural effusion. 4. Colonic diverticulosis without evidence of acute diverticulitis at this time. 5. Additional incidental findings, as above. Electronically Signed   By: Vinnie Langton M.D.   On: 09/30/2020 11:55   US RENAL  Result Date: 10/09/2020 CLINICAL DATA:  Rising creatinine.  Advanced gyn malignancy EXAM: RENAL / URINARY TRACT ULTRASOUND COMPLETE COMPARISON:  CT abdomen pelvis 09/30/2020 FINDINGS: Right Kidney: Renal measurements: 10.8 x 5.2 x 5.6 cm = volume: 162 mL. Mild right hydronephrosis unchanged from prior CT. No renal mass. Left Kidney: Renal measurements: 11.1 x 5.2 x 5.2 cm = volume: 156 mL. Mild left hydronephrosis unchanged from recent CT. No left renal mass.  Bladder: Urinary jets not seen during 10 minutes of observation. Other: Trace ascites IMPRESSION: Mild bilateral renal obstruction likely due to distal ureteral obstruction. No change from recent CT. Electronically Signed   By: Franchot Gallo M.D.   On: 10/09/2020 16:17   DG OR UROLOGY CYSTO IMAGE ([facility] ONLY)  Result Date: 10/10/2020 There is no interpretation for this exam.  This order is for images obtained during a surgical procedure.  Please See "Surgeries" Tab for more information regarding the procedure.   US BREAST LTD UNI RIGHT INC AXILLA  Result Date: 10/01/2020 CLINICAL DATA:  65 year old female presenting with a new palpable area of concern in the right breast. EXAM: DIGITAL DIAGNOSTIC UNILATERAL RIGHT MAMMOGRAM WITH TOMOSYNTHESIS AND CAD; ULTRASOUND RIGHT BREAST LIMITED TECHNIQUE: Right digital diagnostic mammography and breast tomosynthesis was performed. The images were evaluated with computer-aided detection.; Targeted ultrasound examination of the right breast was performed COMPARISON:  Previous exam(s). ACR Breast Density  Category b: There are scattered areas of fibroglandular density. FINDINGS: Mammogram: A skin BB marks the site of concern reported by the patient in the upper outer right breast. A spot tangential view of this area is performed in addition to standard views. There is a round circumscribed mass with central fatty hilum measuring approximately 1.2 cm at the palpable site, most likely an intramammary lymph node. There are no additional new findings elsewhere in the right breast. On physical exam at site of concern reported by the patient I feel a discrete mass. Ultrasound: Targeted ultrasound is performed at the palpable site of concern in the right breast at 9:30 o'clock 9 cm from the nipple demonstrating a round circumscribed hypoechoic mass with central fatty hilum measuring 1.3 x 1.3 x 1.1 cm, consistent with a intramammary lymph node. There is cortical thickening measuring 0.5 cm. This corresponds to the mammographic finding. Targeted ultrasound of the right axilla demonstrates normal lymph nodes. IMPRESSION: Abnormal intramammary lymph node in the right breast at 9:30 o'clock at the palpable site reported by the patient. RECOMMENDATION: Ultrasound-guided core needle biopsy of the right breast mass at 9:30 o'clock. I have discussed the findings and recommendations with the patient who agrees to proceed with biopsy. The patient will be contacted by our scheduler to arrange the biopsy appointment. BI-RADS CATEGORY  4: Suspicious. Electronically Signed   By: Audie Pinto M.D.   On: 10/01/2020 11:23   MM DIAG BREAST TOMO UNI RIGHT  Result Date: 10/01/2020 CLINICAL DATA:  65 year old female presenting with a new palpable area of concern in the right breast. EXAM: DIGITAL DIAGNOSTIC UNILATERAL RIGHT MAMMOGRAM WITH TOMOSYNTHESIS AND CAD; ULTRASOUND RIGHT BREAST LIMITED TECHNIQUE: Right digital diagnostic mammography and breast tomosynthesis was performed. The images were evaluated with computer-aided detection.;  Targeted ultrasound examination of the right breast was performed COMPARISON:  Previous exam(s). ACR Breast Density Category b: There are scattered areas of fibroglandular density. FINDINGS: Mammogram: A skin BB marks the site of concern reported by the patient in the upper outer right breast. A spot tangential view of this area is performed in addition to standard views. There is a round circumscribed mass with central fatty hilum measuring approximately 1.2 cm at the palpable site, most likely an intramammary lymph node. There are no additional new findings elsewhere in the right breast. On physical exam at site of concern reported by the patient I feel a discrete mass. Ultrasound: Targeted ultrasound is performed at the palpable site of concern in the right breast at 9:30 o'clock 9 cm from the  nipple demonstrating a round circumscribed hypoechoic mass with central fatty hilum measuring 1.3 x 1.3 x 1.1 cm, consistent with a intramammary lymph node. There is cortical thickening measuring 0.5 cm. This corresponds to the mammographic finding. Targeted ultrasound of the right axilla demonstrates normal lymph nodes. IMPRESSION: Abnormal intramammary lymph node in the right breast at 9:30 o'clock at the palpable site reported by the patient. RECOMMENDATION: Ultrasound-guided core needle biopsy of the right breast mass at 9:30 o'clock. I have discussed the findings and recommendations with the patient who agrees to proceed with biopsy. The patient will be contacted by our scheduler to arrange the biopsy appointment. BI-RADS CATEGORY  4: Suspicious. Electronically Signed   By: Audie Pinto M.D.   On: 10/01/2020 11:23    Assessment and plan- Patient is a 65 y.o. 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  AKI: Possibly secondary to obstruction from tumor versus contrast-induced  nephropathy.Her baseline creatinine is normal and was up to 3.5 yesterday.  She has now undergone bilateral ureteral stenting.  Continue IV hydration and monitor creatinine for the next 1 to 2 days.  If there is a continued downtrend in her creatinine okay to discharge the patient over the next 1 to 2 days.  She is only receiving maintenance IV fluids at 75 cc an hour which I would recommend to increase to 125 cc an hour.  I will follow her creatinine closely upon discharge  Metastatic GYN malignancy: Patient was noted to have tumor involving the posterior bladder wall which appeared to arise extrinsically.  This was biopsied today and we will therefore hold off on omental biopsies.  I will follow-up with her as an outpatient next week to discuss biopsy results and further management.  PET scan has also been scheduled as an outpatient  Breast mass: Patient was noted to have an enlarged internal mammary lymph node 1.2 cm which was supposed to be biopsied today as an outpatient.  We will reschedule his biopsy upon discharge   Visit Diagnosis 1. Abdominal pain   2. Lymphadenopathy     Dr. Randa Evens, MD, MPH [facility] at [facility] [phone] 10/10/2020  9:14 PM

## 2020-10-10 NOTE — Consult Note (Signed)
Chief Complaint: Patient was seen in consultation today for peritoneal mass biopsy  Referring Physician(s): Randa Evens, MD  Patient Status: [place] - In-pt  History of Present Illness: Tracy Goodman is a 65 y.o. 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  Recent CT demonstrates lower pelvic peritoneal caking and lymphadenopathy with mass-like appearance of the cervix.  She will go to the OR today with Urology for bilateral ureteral stent placement.  Request for peritoneal mass biopsy today if possible.     Past Medical History:  Diagnosis Date  . Diverticulitis   . Hypertension     Past Surgical History:  Procedure Laterality Date  . CESAREAN SECTION    . WRIST FRACTURE SURGERY Left     Allergies: Patient has no known allergies.  Medications: Prior to Admission medications   Medication Sig Start Date End Date Taking? Authorizing Provider  ibuprofen (ADVIL) 200 MG tablet Take 400 mg by mouth every 6 (six) hours as needed for fever or mild pain.   Yes [provider]  Multiple Vitamin (MULTIVITAMIN WITH MINERALS) TABS tablet Take 1 tablet by mouth daily.   Yes [provider]  atorvastatin (LIPITOR) 10 MG tablet Take 1 tablet (10 mg total) by mouth daily. Patient not taking: No sig reported 09/24/20   [name], MD     Family History  Problem Relation Age of Onset  . COPD Mother   . Heart failure Mother     Social History   Socioeconomic History  . Marital status: Married    Spouse name: Tempie Donning  . Number of children: 3  . Years of education: high school  . Highest education level: Not on file  Occupational History  . Not on file  Tobacco Use  . Smoking status: Never Smoker  . Smokeless tobacco: Never Used  Substance and Sexual Activity  . Alcohol use: Not Currently  . Drug use: Never  . Sexual activity: Yes    Birth control/protection: Post-menopausal  Other  Topics Concern  . Not on file  Social History Narrative   03/12/20   From: [place] area   Living: with Tempie Donning, husband (2012) and one step son   Work: school bus driver      Family: 3 adult children - [place], [place], [place]il[place] Drummer - 6-7 grandchildren      Enjoys: singing in the choir at church      Exercise: weight lifting in the morning, stretches   Diet: varied, tries to eat healthy - baked salmon and fish/chicken/fried food      Safety   Seat belts: Yes    Guns: No   Safe in relationships: Yes    Social Determinants of Radio broadcast assistant Strain: Not on file  Food Insecurity: Not on file  Transportation Needs: Not on file  Physical Activity: Not on file  Stress: Not on file  Social Connections: Not on file    Review of Systems: A 12 point ROS discussed and pertinent positives are indicated in the HPI above.  All other systems are negative.  Vital Signs: BP (!) 164/83 (BP Location: Left Arm)   Pulse 92   Temp 98.4 F (36.9 C)   Resp 15   Ht 4' 11.02" (1.499 m)   Wt 108 kg   SpO2 99%   BMI 48.06 kg/m   Physical Exam Constitutional:      Appearance: Normal appearance.  HENT:     Head:  Normocephalic.     Mouth/Throat:     Mouth: Mucous membranes are moist.     Comments: MP2 Cardiovascular:     Rate and Rhythm: Normal rate and regular rhythm.     Heart sounds: Normal heart sounds.  Pulmonary:     Breath sounds: Normal breath sounds.  Abdominal:     General: There is no distension.  Skin:    General: Skin is warm and dry.  Neurological:     Mental Status: She is alert and oriented to person, place, and time.     Imaging: CT ABDOMEN PELVIS WO CONTRAST  Result Date: 09/30/2020 CLINICAL DATA:  65 year old female with abdominal pain greater on the left. Recent diverticulitis status post full course of antibiotics. Two episodes of bright red blood in stool yesterday. EXAM: CT ABDOMEN AND PELVIS WITHOUT CONTRAST TECHNIQUE: Multidetector CT imaging of the  abdomen and pelvis was performed following the standard protocol without IV contrast. COMPARISON:  CT Abdomen and Pelvis 09/04/2020. FINDINGS: Lower chest: Small layering left pleural effusion is new since last month. No cardiomegaly or pericardial effusion. Mild lung base atelectasis. Hepatobiliary: Trace perihepatic free fluid is new since last month. Otherwise noncontrast liver and gallbladder appear stable. Pancreas: Negative. Spleen: Small volume perisplenic free fluid is new since last month. Simple fluid density. Otherwise stable noncontrast spleen. Adrenals/Urinary Tract: Normal adrenal glands. New bilateral hydronephrosis (series 2, image 32). No nephrolithiasis. Both ureters are difficult to delineate at the pelvic inlet where to generalized increased inflammation is noted from last month. Urinary bladder is diminutive. Stomach/Bowel: New free fluid layering in both gutters greater on the left. Simple fluid density on that side (coronal image 90). Generalized mesenteric stranding in the distal omentum (coronal image 74 and series 2, image 64) superficial to the uterus and adnexa which are abnormal, described below. No dilated small or large bowel loops. Diverticulosis of the large bowel. Sigmoid colon is indistinct and inseparable from the uterine fundus on sagittal image 116. No pneumoperitoneum is identified. Negative terminal ileum. Appendix cannot be delineated but likely courses toward the uterine fundus. Decompressed stomach and duodenum. Vascular/Lymphatic: Aortoiliac calcified atherosclerosis. Normal caliber abdominal aorta. Vascular patency is not evaluated in the absence of IV contrast. Reproductive: Moderate generalized parametrial inflammation has increased (series 2, image 63). Continued gas in the vagina. No gas identified within the endometrium. Other: Increased presacral stranding since last month. No layering pelvic free fluid. Small fat containing inguinal hernias are stable.  Musculoskeletal: Lumbar facet arthropathy. No acute osseous abnormality identified. IMPRESSION: 1. Small volume new free fluid in the abdomen and pelvis. No free air identified. Increased and generalized inflammation at the pelvic inlet, in the distal omentum, and about the uterus and ovaries. Sigmoid colon appears indistinct and inseparable from the uterine fundus, with persistent gas within the vagina. Appendix not delineated. New bilateral hydronephrosis. 2. Recommend repeat CT Abdomen and Pelvis with oral and IV contrast to further characterize (and recommend sufficient oral contrast and timing delay to ensure contrast has reached the sigmoid colon). 3. Small new layering left pleural effusion. Electronically Signed   By: Genevie Ann M.D.   On: 09/30/2020 06:28   CT ABDOMEN PELVIS W CONTRAST  Result Date: 09/30/2020 CLINICAL DATA:  65 year old female with history of left lower abdominal pain. Suspected abdominal abscess or infection. EXAM: CT ABDOMEN AND PELVIS WITH CONTRAST TECHNIQUE: Multidetector CT imaging of the abdomen and pelvis was performed using the standard protocol following bolus administration of intravenous contrast. CONTRAST:  46mL OMNIPAQUE  IOHEXOL 300 MG/ML  SOLN COMPARISON:  CT of the abdomen and pelvis 10/01/2018 to. FINDINGS: Lower chest: Small left pleural effusion lying dependently with some associated passive subsegmental atelectasis in the left lower lobe. Scarring or subsegmental atelectasis in the right middle lobe also noted. Hepatobiliary: No suspicious cystic or solid hepatic lesions. No intra or extrahepatic biliary ductal dilatation. Gallbladder is normal in appearance. Pancreas: Fatty infiltration in the pancreas. No pancreatic mass. No pancreatic ductal dilatation. No pancreatic or peripancreatic fluid collections or inflammatory changes. Spleen: Unremarkable. Adrenals/Urinary Tract: Bilateral kidneys and adrenal glands are normal in appearance. No hydroureteronephrosis. Urinary  bladder is nearly completely collapsed, but otherwise unremarkable in appearance. Stomach/Bowel: Stomach is completely decompressed, but otherwise unremarkable in appearance. No pathologic dilatation of small bowel or colon. Numerous colonic diverticulae are noted, without surrounding inflammatory changes to clearly indicate an acute diverticulitis at this time. Appendix is not confidently identified may be surgically absent. Vascular/Lymphatic: Atherosclerosis in the pelvic vasculature. No aneurysm or dissection noted in the abdominal or pelvic vasculature. Multiple prominent borderline enlarged and mildly enlarged retroperitoneal and pelvic lymph nodes. Specific examples include a left para-aortic lymph node measuring 1 cm in short axis adjacent to the left renal hilum (axial image 33 of series 2), 1.3 cm in short axis in the left para-aortic nodal station inferior to the left renal hilum (axial image 40 of series 2), 1 cm in short axis in the right external iliac nodal station (axial images 62 and 64 of series 2) and 1.3 cm in short axis in the right common femoral nodal station (axial image 75 of series 2). Borderline enlarged mesorectal lymph node on the right (axial image 75 of series 2) measuring 9 mm in short axis. Reproductive: Mass-like enlargement of the cervix measuring 3.8 x 3.0 x 2.9 cm. Remainder of the uterus and ovaries are otherwise unremarkable in appearance. Other: Trace volume of ascites. Extensive omental nodularity, most notable in the low anatomic pelvis. Bilateral inguinal hernias containing predominantly fat, although some of the lower omental nodularity extends into the right inguinal hernia. Small umbilical hernia containing omental fat. Trace volume of ascites. No pneumoperitoneum. Musculoskeletal: There are no aggressive appearing lytic or blastic lesions noted in the visualized portions of the skeleton. IMPRESSION: 1. No intra-abdominal abscess. 2. There is evidence of intra-abdominal  malignancy with pelvic and retroperitoneal lymphadenopathy, as well as probable intraperitoneal metastatic disease best demonstrated by omental caking and small volume of presumably malignant ascites. Notably, the cervix has a mass-like appearance. Further evaluation with pelvic examination is recommended to exclude gynecologic malignancy. 3. Small left pleural effusion. 4. Colonic diverticulosis without evidence of acute diverticulitis at this time. 5. Additional incidental findings, as above. Electronically Signed   By: Vinnie Langton M.D.   On: 09/30/2020 11:55   US RENAL  Result Date: 10/09/2020 CLINICAL DATA:  Rising creatinine.  Advanced gyn malignancy EXAM: RENAL / URINARY TRACT ULTRASOUND COMPLETE COMPARISON:  CT abdomen pelvis 09/30/2020 FINDINGS: Right Kidney: Renal measurements: 10.8 x 5.2 x 5.6 cm = volume: 162 mL. Mild right hydronephrosis unchanged from prior CT. No renal mass. Left Kidney: Renal measurements: 11.1 x 5.2 x 5.2 cm = volume: 156 mL. Mild left hydronephrosis unchanged from recent CT. No left renal mass. Bladder: Urinary jets not seen during 10 minutes of observation. Other: Trace ascites IMPRESSION: Mild bilateral renal obstruction likely due to distal ureteral obstruction. No change from recent CT. Electronically Signed   By: Franchot Gallo M.D.   On: 10/09/2020 16:17  US BREAST LTD UNI RIGHT INC AXILLA  Result Date: 10/01/2020 CLINICAL DATA:  65 year old female presenting with a new palpable area of concern in the right breast. EXAM: DIGITAL DIAGNOSTIC UNILATERAL RIGHT MAMMOGRAM WITH TOMOSYNTHESIS AND CAD; ULTRASOUND RIGHT BREAST LIMITED TECHNIQUE: Right digital diagnostic mammography and breast tomosynthesis was performed. The images were evaluated with computer-aided detection.; Targeted ultrasound examination of the right breast was performed COMPARISON:  Previous exam(s). ACR Breast Density Category b: There are scattered areas of fibroglandular density. FINDINGS:  Mammogram: A skin BB marks the site of concern reported by the patient in the upper outer right breast. A spot tangential view of this area is performed in addition to standard views. There is a round circumscribed mass with central fatty hilum measuring approximately 1.2 cm at the palpable site, most likely an intramammary lymph node. There are no additional new findings elsewhere in the right breast. On physical exam at site of concern reported by the patient I feel a discrete mass. Ultrasound: Targeted ultrasound is performed at the palpable site of concern in the right breast at 9:30 o'clock 9 cm from the nipple demonstrating a round circumscribed hypoechoic mass with central fatty hilum measuring 1.3 x 1.3 x 1.1 cm, consistent with a intramammary lymph node. There is cortical thickening measuring 0.5 cm. This corresponds to the mammographic finding. Targeted ultrasound of the right axilla demonstrates normal lymph nodes. IMPRESSION: Abnormal intramammary lymph node in the right breast at 9:30 o'clock at the palpable site reported by the patient. RECOMMENDATION: Ultrasound-guided core needle biopsy of the right breast mass at 9:30 o'clock. I have discussed the findings and recommendations with the patient who agrees to proceed with biopsy. The patient will be contacted by our scheduler to arrange the biopsy appointment. BI-RADS CATEGORY  4: Suspicious. Electronically Signed   By: Audie Pinto M.D.   On: 10/01/2020 11:23   MM DIAG BREAST TOMO UNI RIGHT  Result Date: 10/01/2020 CLINICAL DATA:  65 year old female presenting with a new palpable area of concern in the right breast. EXAM: DIGITAL DIAGNOSTIC UNILATERAL RIGHT MAMMOGRAM WITH TOMOSYNTHESIS AND CAD; ULTRASOUND RIGHT BREAST LIMITED TECHNIQUE: Right digital diagnostic mammography and breast tomosynthesis was performed. The images were evaluated with computer-aided detection.; Targeted ultrasound examination of the right breast was performed  COMPARISON:  Previous exam(s). ACR Breast Density Category b: There are scattered areas of fibroglandular density. FINDINGS: Mammogram: A skin BB marks the site of concern reported by the patient in the upper outer right breast. A spot tangential view of this area is performed in addition to standard views. There is a round circumscribed mass with central fatty hilum measuring approximately 1.2 cm at the palpable site, most likely an intramammary lymph node. There are no additional new findings elsewhere in the right breast. On physical exam at site of concern reported by the patient I feel a discrete mass. Ultrasound: Targeted ultrasound is performed at the palpable site of concern in the right breast at 9:30 o'clock 9 cm from the nipple demonstrating a round circumscribed hypoechoic mass with central fatty hilum measuring 1.3 x 1.3 x 1.1 cm, consistent with a intramammary lymph node. There is cortical thickening measuring 0.5 cm. This corresponds to the mammographic finding. Targeted ultrasound of the right axilla demonstrates normal lymph nodes. IMPRESSION: Abnormal intramammary lymph node in the right breast at 9:30 o'clock at the palpable site reported by the patient. RECOMMENDATION: Ultrasound-guided core needle biopsy of the right breast mass at 9:30 o'clock. I have discussed the findings and recommendations  with the patient who agrees to proceed with biopsy. The patient will be contacted by our scheduler to arrange the biopsy appointment. BI-RADS CATEGORY  4: Suspicious. Electronically Signed   By: Audie Pinto M.D.   On: 10/01/2020 11:23    Labs:  CBC: Recent Labs    09/30/20 0133 10/04/20 1548 10/09/20 1822 10/10/20 0322  WBC 6.5 6.0 6.0 6.9  HGB 12.6 12.9 11.6* 10.9*  HCT 41.0 41.9 38.4 35.3*  PLT 344 393 437* 413*    COAGS: No results for input(s): INR, APTT in the last 8760 hours.  BMP: Recent Labs    09/30/20 0133 10/04/20 1548 10/09/20 1101 10/10/20 0322  NA 139 142  142 137  K 3.6 3.9 4.4 4.0  CL 108 106 105 105  CO2 21* 24 24 22   GLUCOSE 100* 99 105* 94  BUN 10 17 24* 23  CALCIUM 9.1 8.9 8.9 8.6*  CREATININE 1.97* 2.55* 3.52* 3.41*  GFRNONAA 28* 21* 14* 15*    LIVER FUNCTION TESTS: Recent Labs    03/12/20 1626 09/04/20 0620 09/30/20 0133 10/04/20 1548  BILITOT 0.4 0.4 0.7 0.4  AST 13 21 13* 31  ALT 10 15 10 20   ALKPHOS 77 91 64 95  PROT 7.2 7.5 7.3 7.7  ALBUMIN 4.0 3.4* 3.4* 3.4*    TUMOR MARKERS: No results for input(s): AFPTM, CEA, CA199, CHROMGRNA in the last 8760 hours.  Assessment and Plan:  65 year old female with history of metastatic cancer, likely of gynecologic etiology, with peritoneal caking and renal failure.  Plan for CT guided peritoneal mass biopsy with moderate sedation.  Will plan for today after ureteral stent placement in OR with Urology.  Risks and benefits of peritoneal mass biopsy was discussed with the patient and patient's family including, but not limited to bleeding, infection, damage to adjacent structures or low yield requiring additional tests.  All of the questions were answered and there is agreement to proceed.  Consent signed and in chart.    Electronically Signed: Suzette Battiest, MD 10/10/2020, 10:12 AM   I spent a total of 20 Minutes in face to face in clinical consultation, greater than 50% of which was counseling/coordinating care for peritoneal mass.

## 2020-10-10 NOTE — Telephone Encounter (Signed)
Left VM with patient to inform her of PET scan scheduled on 4/28. Provided her with all instructions. Sending reminder via mail.

## 2020-10-10 NOTE — Plan of Care (Signed)
Shift Summary: Pt orientedx4, remains on RA, NSR on telemetry. No c/o pain overnight. Elevated BP, PRN hydralazine given x1 for SBP>180. Ambulatory to bathroom without assistance, adequate UO and x1 BM overnight. Pt difficult IV access, unable to obtain 20g for CT despite utilizing resources; ED RN able to place 22 in [facility] after multiple attempts. LR infusing per MAR. Safety precautions in place. NPO since midnight per orders. Rounding performed, needs/concerns addressed during shift.   Problem: Education: Goal: Knowledge of General Education information will improve Description: Including pain rating scale, medication(s)/side effects and non-pharmacologic comfort measures 10/10/2020 0430 by Jocelyn Lamer, RN Outcome: Progressing 10/10/2020 0134 by Jocelyn Lamer, RN Outcome: Progressing   Problem: Health Behavior/Discharge Planning: Goal: Ability to manage health-related needs will improve 10/10/2020 0430 by Jocelyn Lamer, RN Outcome: Progressing 10/10/2020 0134 by Jocelyn Lamer, RN Outcome: Progressing   Problem: Clinical Measurements: Goal: Ability to maintain clinical measurements within normal limits will improve 10/10/2020 0430 by Jocelyn Lamer, RN Outcome: Progressing 10/10/2020 0134 by Jocelyn Lamer, RN Outcome: Progressing Goal: Will remain free from infection 10/10/2020 0430 by Jocelyn Lamer, RN Outcome: Progressing 10/10/2020 0134 by Jocelyn Lamer, RN Outcome: Progressing Goal: Diagnostic test results will improve 10/10/2020 0430 by Jocelyn Lamer, RN Outcome: Progressing 10/10/2020 0134 by Jocelyn Lamer, RN Outcome: Progressing Goal: Respiratory complications will improve 10/10/2020 0430 by Jocelyn Lamer, RN Outcome: Progressing 10/10/2020 0134 by Jocelyn Lamer, RN Outcome: Progressing Goal: Cardiovascular complication will be avoided 10/10/2020 0430 by Jocelyn Lamer, RN Outcome: Progressing 10/10/2020 0134 by Jocelyn Lamer, RN Outcome: Progressing    Problem: Activity: Goal: Risk for activity intolerance will decrease 10/10/2020 0430 by Jocelyn Lamer, RN Outcome: Progressing 10/10/2020 0134 by Jocelyn Lamer, RN Outcome: Progressing   Problem: Nutrition: Goal: Adequate nutrition will be maintained 10/10/2020 0430 by Jocelyn Lamer, RN Outcome: Progressing 10/10/2020 0134 by Jocelyn Lamer, RN Outcome: Progressing   Problem: Coping: Goal: Level of anxiety will decrease 10/10/2020 0430 by Jocelyn Lamer, RN Outcome: Progressing 10/10/2020 0134 by Jocelyn Lamer, RN Outcome: Progressing   Problem: Elimination: Goal: Will not experience complications related to bowel motility 10/10/2020 0430 by Jocelyn Lamer, RN Outcome: Progressing 10/10/2020 0134 by Jocelyn Lamer, RN Outcome: Progressing Goal: Will not experience complications related to urinary retention 10/10/2020 0430 by Jocelyn Lamer, RN Outcome: Progressing 10/10/2020 0134 by Jocelyn Lamer, RN Outcome: Progressing   Problem: Pain Managment: Goal: General experience of comfort will improve 10/10/2020 0430 by Jocelyn Lamer, RN Outcome: Progressing 10/10/2020 0134 by Jocelyn Lamer, RN Outcome: Progressing   Problem: Safety: Goal: Ability to remain free from injury will improve 10/10/2020 0430 by Jocelyn Lamer, RN Outcome: Progressing 10/10/2020 0134 by Jocelyn Lamer, RN Outcome: Progressing   Problem: Skin Integrity: Goal: Risk for impaired skin integrity will decrease 10/10/2020 0430 by Jocelyn Lamer, RN Outcome: Progressing 10/10/2020 0134 by Jocelyn Lamer, RN Outcome: Progressing

## 2020-10-10 NOTE — Progress Notes (Signed)
PROGRESS NOTE                                                                             PROGRESS NOTE                                                                                                                                                                                                             Patient Demographics:    Tracy Goodman, is a 65 y.o. female, DOB - 08-13-1955, RDE:[id number]  Outpatient Primary MD for the patient is [name], MD    LOS - 1  Admit date - 10/09/2020    No chief complaint on file.      Brief Narrative     HPI: Tracy Goodman is a 65 y.o. 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  Patient states that she was told to come to the emergency department due to abnormal labs.  She states that otherwise she is her normal self.  She was diagnosed with cancer, likely cervical origin, September 23, 2020.  She states that she had been having abdominal pain at the time otherwise no other symptoms.  She denies any weight changes.  She states that she is urinating normally, denies dysuria, hematuria, chest pain, shortness of breath, back pain, weight changes, fever, chills, sick contacts.  She denies changes to her diet, and states that she drinks approximately 36 to 40 ounces of water per day.  Social history: She lives at home with her spouse and stepson.  She denies tobacco, EtOH, recreational drug use.  She works as a Teacher, early years/pre.  Vaccination: Patient is vaccinated for COVID-19, 2 doses of Pfizer, no booster.    Subjective:    Irine Heminger today denies any abdominal pain, nausea, vomiting, no headache, no chest pain.     Assessment  & Plan :    Principal Problem:   AKI (acute kidney injury) ([place]) Active  Problems:   Hyperlipidemia    Hypertension   Morbid obesity ([place])   Umbilical hernia   Breast lump on right side at 10 o'clock position   Bilateral hydronephrosis   AKI  -Obstructive uropathy versus contrast-induced -FENa of 0.8%, strong possibility of contrast-induced nephropathy, continue with IV fluids. -GL obstructive uropathy, see discussion below. -Avoid nephrotoxic medications, avoid low blood pressure.  Obstructive uropathy with bilateral hydronephrosis -Secondary to pelvic tumor of GYN origin, causing traction bilateral hydronephrosis and AKI. -Urology input greatly appreciated, s/p cystoscopy, with bilateral pyelogram and bilateral ureteral stent placement and bladder biopsy.  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   Right-sided breast mass at the 10 o'clock position-patient had a breast biopsy scheduled for 10/10/2020, however this will be canceled  Hyperlipidemia - patient was prescribed atorvastatin 10 mg nightly, however she has not started this medication - We will not start inpatient  Morbid obesity- outpatient management with PCP as appropriate  History of hypertension -patient is currently not on any antihypertensives, but her blood pressure is elevated, so she will be on as needed hydralazine -On low-dose Norvasc.     SpO2: 94 %  Recent Labs  Lab 10/04/20 1548 10/09/20 1822 10/09/20 1955 10/10/20 0322  WBC 6.0 6.0  --  6.9  PLT 393 437*  --  413*  BNP  --  19.0  --   --   AST 31  --   --   --   ALT 20  --   --   --   ALKPHOS 95  --   --   --   BILITOT 0.4  --   --   --   ALBUMIN 3.4*  --   --   --   SARSCOV2NAA  --   --  NEGATIVE  --        ABG  No results found for: PHART, PCO2ART, PO2ART, HCO3, TCO2, ACIDBASEDEF, O2SAT       Condition - Extremely Guarded  Family Communication  :  Husband at bedside  Code Status :  DNR  Consults  :   Urology  Procedures  :    Procedure: 1. Cystoscopy 2. Bilateral retrograde pyelogram 3. Bilateral ureteral stent placement 4. Bladder biopsy  Disposition Plan  :    Status is: Inpatient  Remains inpatient appropriate because:IV treatments appropriate due to intensity of illness or inability to take PO   Dispo: The patient is from: Home              Anticipated d/c is to: Home              Patient currently is not medically stable to d/c.   Difficult to place patient No      DVT Prophylaxis  :   SCDs ,[place] heparin   Lab Results  Component Value Date   PLT 413 (H) 10/10/2020    Diet :  Diet Order            Diet regular Room service appropriate? Yes; Fluid consistency: Thin  Diet effective now                  Inpatient Medications  Scheduled Meds: . multivitamin with minerals  1 tablet Oral Daily   Continuous Infusions: . sodium chloride 75 mL/hr at 10/10/20 0739  .  ceFAZolin (ANCEF) IV     PRN Meds:.acetaminophen **OR** acetaminophen,  opium-belladonna, hydrALAZINE, hydrALAZINE, ondansetron **OR** ondansetron (ZOFRAN) IV, oxybutynin, traMADol  Antibiotics  :    Anti-infectives (From admission, onward)   Start     Dose/Rate Route Frequency Ordered Stop   10/10/20 0930  ceFAZolin (ANCEF) IVPB 2g/100 mL premix        2 g 200 mL/hr over 30 Minutes Intravenous On call to O.R. 10/10/20 4098 10/11/20 0559       Burton Gahan M.D on 10/10/2020 at 3:17 PM  To page go to [email or web address]  [facility] -  Office  [phone]     Objective:   Vitals:   10/10/20 1400 10/10/20 1415 10/10/20 1430 10/10/20 1445  BP: (!) 142/98 (!) 159/97 (!) 175/86 (!) 193/130  Pulse: (!) 105 94 82 87  Resp: (!) 23 (!) 26 19 (!) 25  Temp:    97.6 F (36.4 C)  TempSrc:      SpO2: 100% 95% 95% 94%  Weight:      Height:        Wt Readings from Last 3 Encounters:  10/10/20 108 kg  10/09/20 111 kg  10/04/20 108.8 kg     Intake/Output Summary (Last 24  hours) at 10/10/2020 1517 Last data filed at 10/10/2020 1450 Gross per 24 hour  Intake 336.24 ml  Output 575 ml  Net -238.76 ml     Physical Exam  Awake Alert, No new F.N deficits, Normal affect Symmetrical Chest wall movement, Good air movement bilaterally, CTAB RRR,No Gallops,Rubs or new Murmurs, No Parasternal Heave +ve B.Sounds, Abd Soft, No tenderness. No Cyanosis, Clubbing or edema, No new Rash or bruise      Data Review:    CBC Recent Labs  Lab 10/04/20 1548 10/09/20 1822 10/10/20 0322  WBC 6.0 6.0 6.9  HGB 12.9 11.6* 10.9*  HCT 41.9 38.4 35.3*  PLT 393 437* 413*  MCV 87.7 86.7 85.9  MCH 27.0 26.2 26.5  MCHC 30.8 30.2 30.9  RDW 13.2 13.4 13.3  LYMPHSABS 1.5 1.2  --   MONOABS 0.6 0.5  --   EOSABS 0.1 0.1  --   BASOSABS 0.0 0.0  --     Recent Labs  Lab 10/04/20 1548 10/09/20 1101 10/09/20 1822 10/10/20 0322  NA 142 142  --  137  K 3.9 4.4  --  4.0  CL 106 105  --  105  CO2 24 24  --  22  GLUCOSE 99 105*  --  94  BUN 17 24*  --  23  CREATININE 2.55* 3.52*  --  3.41*  CALCIUM 8.9 8.9  --  8.6*  AST 31  --   --   --   ALT 20  --   --   --   ALKPHOS 95  --   --   --   BILITOT 0.4  --   --   --   ALBUMIN 3.4*  --   --   --   TSH  --   --  1.724  --   BNP  --   --  19.0  --     ------------------------------------------------------------------------------------------------------------------ No results for input(s): CHOL, HDL, LDLCALC, TRIG, CHOLHDL, LDLDIRECT in the last 72 hours.  No results found for: HGBA1C ------------------------------------------------------------------------------------------------------------------ Recent Labs    10/09/20 1822  TSH 1.724    Cardiac Enzymes No results for input(s): CKMB, TROPONINI, MYOGLOBIN in the last 168 hours.  Invalid input(s): CK ------------------------------------------------------------------------------------------------------------------    Component Value Date/Time   BNP 19.0 10/09/2020  1822  Micro Results Recent Results (from the past 240 hour(s))  SARS CORONAVIRUS 2 (TAT 6-24 HRS) Nasopharyngeal Nasopharyngeal Swab     Status: None   Collection Time: 10/09/20  7:55 PM   Specimen: Nasopharyngeal Swab  Result Value Ref Range Status   SARS Coronavirus 2 NEGATIVE NEGATIVE Final    Comment: (NOTE) SARS-CoV-2 target nucleic acids are NOT DETECTED.  The SARS-CoV-2 RNA is generally detectable in upper and lower respiratory specimens during the acute phase of infection. Negative results do not preclude SARS-CoV-2 infection, do not rule out co-infections with other pathogens, and should not be used as the sole basis for treatment or other patient management decisions. Negative results must be combined with clinical observations, patient history, and epidemiological information. The expected result is Negative.  Fact Sheet for Patients: SugarRoll.be  Fact Sheet for Healthcare Providers: https://www.woods-mathews.com/  This test is not yet approved or cleared by the [place] FDA and  has been authorized for detection and/or diagnosis of SARS-CoV-2 by FDA under an Emergency Use Authorization (EUA). This EUA will remain  in effect (meaning this test can be used) for the duration of the COVID-19 declaration under Se ction 564(b)(1) of the Act, 21 U.S.C. section 360bbb-3(b)(1), unless the authorization is terminated or revoked sooner.  Performed at [facility], [place] [address] [place], [place] [address]     Radiology Reports CT ABDOMEN PELVIS WO CONTRAST  Result Date: 09/30/2020 CLINICAL DATA:  65 year old female with abdominal pain greater on the left. Recent diverticulitis status post full course of antibiotics. Two episodes of bright red blood in stool yesterday. EXAM: CT ABDOMEN AND PELVIS WITHOUT CONTRAST TECHNIQUE: Multidetector CT imaging of the abdomen and pelvis was performed following the standard protocol  without IV contrast. COMPARISON:  CT Abdomen and Pelvis 09/04/2020. FINDINGS: Lower chest: Small layering left pleural effusion is new since last month. No cardiomegaly or pericardial effusion. Mild lung base atelectasis. Hepatobiliary: Trace perihepatic free fluid is new since last month. Otherwise noncontrast liver and gallbladder appear stable. Pancreas: Negative. Spleen: Small volume perisplenic free fluid is new since last month. Simple fluid density. Otherwise stable noncontrast spleen. Adrenals/Urinary Tract: Normal adrenal glands. New bilateral hydronephrosis (series 2, image 32). No nephrolithiasis. Both ureters are difficult to delineate at the pelvic inlet where to generalized increased inflammation is noted from last month. Urinary bladder is diminutive. Stomach/Bowel: New free fluid layering in both gutters greater on the left. Simple fluid density on that side (coronal image 90). Generalized mesenteric stranding in the distal omentum (coronal image 74 and series 2, image 64) superficial to the uterus and adnexa which are abnormal, described below. No dilated small or large bowel loops. Diverticulosis of the large bowel. Sigmoid colon is indistinct and inseparable from the uterine fundus on sagittal image 116. No pneumoperitoneum is identified. Negative terminal ileum. Appendix cannot be delineated but likely courses toward the uterine fundus. Decompressed stomach and duodenum. Vascular/Lymphatic: Aortoiliac calcified atherosclerosis. Normal caliber abdominal aorta. Vascular patency is not evaluated in the absence of IV contrast. Reproductive: Moderate generalized parametrial inflammation has increased (series 2, image 63). Continued gas in the vagina. No gas identified within the endometrium. Other: Increased presacral stranding since last month. No layering pelvic free fluid. Small fat containing inguinal hernias are stable. Musculoskeletal: Lumbar facet arthropathy. No acute osseous abnormality  identified. IMPRESSION: 1. Small volume new free fluid in the abdomen and pelvis. No free air identified. Increased and generalized inflammation at the pelvic inlet, in the distal omentum, and about  the uterus and ovaries. Sigmoid colon appears indistinct and inseparable from the uterine fundus, with persistent gas within the vagina. Appendix not delineated. New bilateral hydronephrosis. 2. Recommend repeat CT Abdomen and Pelvis with oral and IV contrast to further characterize (and recommend sufficient oral contrast and timing delay to ensure contrast has reached the sigmoid colon). 3. Small new layering left pleural effusion. Electronically Signed   By: Genevie Ann M.D.   On: 09/30/2020 06:28   CT ABDOMEN PELVIS W CONTRAST  Result Date: 09/30/2020 CLINICAL DATA:  65 year old female with history of left lower abdominal pain. Suspected abdominal abscess or infection. EXAM: CT ABDOMEN AND PELVIS WITH CONTRAST TECHNIQUE: Multidetector CT imaging of the abdomen and pelvis was performed using the standard protocol following bolus administration of intravenous contrast. CONTRAST:  63mL OMNIPAQUE IOHEXOL 300 MG/ML  SOLN COMPARISON:  CT of the abdomen and pelvis 10/01/2018 to. FINDINGS: Lower chest: Small left pleural effusion lying dependently with some associated passive subsegmental atelectasis in the left lower lobe. Scarring or subsegmental atelectasis in the right middle lobe also noted. Hepatobiliary: No suspicious cystic or solid hepatic lesions. No intra or extrahepatic biliary ductal dilatation. Gallbladder is normal in appearance. Pancreas: Fatty infiltration in the pancreas. No pancreatic mass. No pancreatic ductal dilatation. No pancreatic or peripancreatic fluid collections or inflammatory changes. Spleen: Unremarkable. Adrenals/Urinary Tract: Bilateral kidneys and adrenal glands are normal in appearance. No hydroureteronephrosis. Urinary bladder is nearly completely collapsed, but otherwise unremarkable in  appearance. Stomach/Bowel: Stomach is completely decompressed, but otherwise unremarkable in appearance. No pathologic dilatation of small bowel or colon. Numerous colonic diverticulae are noted, without surrounding inflammatory changes to clearly indicate an acute diverticulitis at this time. Appendix is not confidently identified may be surgically absent. Vascular/Lymphatic: Atherosclerosis in the pelvic vasculature. No aneurysm or dissection noted in the abdominal or pelvic vasculature. Multiple prominent borderline enlarged and mildly enlarged retroperitoneal and pelvic lymph nodes. Specific examples include a left para-aortic lymph node measuring 1 cm in short axis adjacent to the left renal hilum (axial image 33 of series 2), 1.3 cm in short axis in the left para-aortic nodal station inferior to the left renal hilum (axial image 40 of series 2), 1 cm in short axis in the right external iliac nodal station (axial images 62 and 64 of series 2) and 1.3 cm in short axis in the right common femoral nodal station (axial image 75 of series 2). Borderline enlarged mesorectal lymph node on the right (axial image 75 of series 2) measuring 9 mm in short axis. Reproductive: Mass-like enlargement of the cervix measuring 3.8 x 3.0 x 2.9 cm. Remainder of the uterus and ovaries are otherwise unremarkable in appearance. Other: Trace volume of ascites. Extensive omental nodularity, most notable in the low anatomic pelvis. Bilateral inguinal hernias containing predominantly fat, although some of the lower omental nodularity extends into the right inguinal hernia. Small umbilical hernia containing omental fat. Trace volume of ascites. No pneumoperitoneum. Musculoskeletal: There are no aggressive appearing lytic or blastic lesions noted in the visualized portions of the skeleton. IMPRESSION: 1. No intra-abdominal abscess. 2. 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 Notably, the cervix has a mass-like appearance. Further evaluation with pelvic examination is recommended to exclude gynecologic malignancy. 3. Small left pleural effusion. 4. Colonic diverticulosis without evidence of acute diverticulitis at this time. 5. Additional incidental findings, as above.  Electronically Signed   By: Vinnie Langton M.D.   On: 09/30/2020 11:55   US RENAL  Result Date: 10/09/2020 CLINICAL DATA:  Rising creatinine.  Advanced gyn malignancy EXAM: RENAL / URINARY TRACT ULTRASOUND COMPLETE COMPARISON:  CT abdomen pelvis 09/30/2020 FINDINGS: Right Kidney: Renal measurements: 10.8 x 5.2 x 5.6 cm = volume: 162 mL. Mild right hydronephrosis unchanged from prior CT. No renal mass. Left Kidney: Renal measurements: 11.1 x 5.2 x 5.2 cm = volume: 156 mL. Mild left hydronephrosis unchanged from recent CT. No left renal mass. Bladder: Urinary jets not seen during 10 minutes of observation. Other: Trace ascites IMPRESSION: Mild bilateral renal obstruction likely due to distal ureteral obstruction. No change from recent CT. Electronically Signed   By: Franchot Gallo M.D.   On: 10/09/2020 16:17   DG OR UROLOGY CYSTO IMAGE ([facility] ONLY)  Result Date: 10/10/2020 There is no interpretation for this exam.  This order is for images obtained during a surgical procedure.  Please See "Surgeries" Tab for more information regarding the procedure.   US BREAST LTD UNI RIGHT INC AXILLA  Result Date: 10/01/2020 CLINICAL DATA:  65 year old female presenting with a new palpable area of concern in the right breast. EXAM: DIGITAL DIAGNOSTIC UNILATERAL RIGHT MAMMOGRAM WITH TOMOSYNTHESIS AND CAD; ULTRASOUND RIGHT BREAST LIMITED TECHNIQUE: Right digital diagnostic mammography and breast tomosynthesis was performed. The images were evaluated with computer-aided detection.; Targeted ultrasound  examination of the right breast was performed COMPARISON:  Previous exam(s). ACR Breast Density Category b: There are scattered areas of fibroglandular density. FINDINGS: Mammogram: A skin BB marks the site of concern reported by the patient in the upper outer right breast. A spot tangential view of this area is performed in addition to standard views. There is a round circumscribed mass with central fatty hilum measuring approximately 1.2 cm at the palpable site, most likely an intramammary lymph node. There are no additional new findings elsewhere in the right breast. On physical exam at site of concern reported by the patient I feel a discrete mass. Ultrasound: Targeted ultrasound is performed at the palpable site of concern in the right breast at 9:30 o'clock 9 cm from the nipple demonstrating a round circumscribed hypoechoic mass with central fatty hilum measuring 1.3 x 1.3 x 1.1 cm, consistent with a intramammary lymph node. There is cortical thickening measuring 0.5 cm. This corresponds to the mammographic finding. Targeted ultrasound of the right axilla demonstrates normal lymph nodes. IMPRESSION: Abnormal intramammary lymph node in the right breast at 9:30 o'clock at the palpable site reported by the patient. RECOMMENDATION: Ultrasound-guided core needle biopsy of the right breast mass at 9:30 o'clock. I have discussed the findings and recommendations with the patient who agrees to proceed with biopsy. The patient will be contacted by our scheduler to arrange the biopsy appointment. BI-RADS CATEGORY  4: Suspicious. Electronically Signed   By: Audie Pinto M.D.   On: 10/01/2020 11:23   MM DIAG BREAST TOMO UNI RIGHT  Result Date: 10/01/2020 CLINICAL DATA:  65 year old female presenting with a new palpable area of concern in the right breast. EXAM: DIGITAL DIAGNOSTIC UNILATERAL RIGHT MAMMOGRAM WITH TOMOSYNTHESIS AND CAD; ULTRASOUND RIGHT BREAST LIMITED TECHNIQUE: Right digital diagnostic mammography  and breast tomosynthesis was performed. The images were evaluated with computer-aided detection.; Targeted ultrasound examination of the right breast was performed COMPARISON:  Previous exam(s). ACR Breast Density Category b: There are scattered areas of fibroglandular density. FINDINGS: Mammogram: A skin BB marks the site of concern reported by the  patient in the upper outer right breast. A spot tangential view of this area is performed in addition to standard views. There is a round circumscribed mass with central fatty hilum measuring approximately 1.2 cm at the palpable site, most likely an intramammary lymph node. There are no additional new findings elsewhere in the right breast. On physical exam at site of concern reported by the patient I feel a discrete mass. Ultrasound: Targeted ultrasound is performed at the palpable site of concern in the right breast at 9:30 o'clock 9 cm from the nipple demonstrating a round circumscribed hypoechoic mass with central fatty hilum measuring 1.3 x 1.3 x 1.1 cm, consistent with a intramammary lymph node. There is cortical thickening measuring 0.5 cm. This corresponds to the mammographic finding. Targeted ultrasound of the right axilla demonstrates normal lymph nodes. IMPRESSION: Abnormal intramammary lymph node in the right breast at 9:30 o'clock at the palpable site reported by the patient. RECOMMENDATION: Ultrasound-guided core needle biopsy of the right breast mass at 9:30 o'clock. I have discussed the findings and recommendations with the patient who agrees to proceed with biopsy. The patient will be contacted by our scheduler to arrange the biopsy appointment. BI-RADS CATEGORY  4: Suspicious. Electronically Signed   By: Audie Pinto M.D.   On: 10/01/2020 11:23

## 2020-10-10 NOTE — Progress Notes (Signed)
Pt not in room at this time.Christopher RN to notify if PIV needed once returns to room.

## 2020-10-10 NOTE — Op Note (Signed)
Date of procedure: 10/10/20  Preoperative diagnosis:  1. Pelvic tumor of GYN origin 2. Bilateral hydronephrosis 3. Acute kidney injury  Postoperative diagnosis:  1. Same as above  Procedure: 1. Cystoscopy 2. Bilateral retrograde pyelogram 3. Bilateral ureteral stent placement 4. Bladder biopsy  Surgeon: Hollice Espy, MD  Anesthesia: General  Complications: None  Intraoperative findings: Nodular submucosal appearing tumor, at least 3 cm on posterior bladder wall extending up towards the right greater than left side.  There is also some mid trigone involved but the UOs themselves are uninvolved.  Moderate right hydronephrosis, minimal left hydronephrosis.  Bilateral stents placed without difficulty.  See PACS for imaging.  EBL: Minimal  Specimens: Bladder tumor  Drains: 6 x 22 French double-J ureteral stent bilaterally, Bard Optima  Indication: Tracy Goodman is a 65 y.o. patient with pelvic malignancy presumably of GYN origin with mild bilateral hydronephrosis and worsening renal function.  After reviewing the management options for treatment, she elected to proceed with the above surgical procedure(s). We have discussed the potential benefits and risks of the procedure, side effects of the proposed treatment, the likelihood of the patient achieving the goals of the procedure, and any potential problems that might occur during the procedure or recuperation. Informed consent has been obtained.  Description of procedure:  The patient was taken to the operating room and general anesthesia was induced.  The patient was placed in the dorsal lithotomy position, prepped and draped in the usual sterile fashion, and preoperative antibiotics were administered. A preoperative time-out was performed.   A 21 French scope was advanced per urethra into the bladder.  Notably, the bladder did have some extrinsic compression and was displaced somewhat anteriorly.  Notably, on the posterior  bladder wall, right greater than left extending from the mid trigone towards the posterior bladder wall and off to the right, there was some nodular hypervascular tumor which appeared to be arising extrinsically from the bladder but involving the bladder itself.  This was consistent with known possible tumor of GYN origin.  The UOs themselves are uninvolved.  This involved at least 3 x 2 cm area.  At this point time, attention was turned to the right UO.  This cannulated using a 5 [place] open-ended ureteral catheter.  A gentle retrograde pyelogram on this side revealed some compression of the mid distal ureter with proximal moderate hydroureteronephrosis.  A wire was able to be placed up to the level of the kidney and a 6 x 22 French Bard Optima ureteral stent was placed in excellent position with a full coil within the renal pelvis as well as within the bladder.  Attention was then turned to the left side.  A retrograde on the side was a less remarkable with less obstruction although there was some mild hydronephrosis and sluggish drainage.  A wire was placed up to the level of the kidney and the same procedure was used to place a Bard Optima ureteral stent, 6 x 22 in the same position with an excellent curl in the bladder and renal pelvis.  Lastly, I contacted Dr. Janese Banks her medical oncologist about whether or not to biopsy the bladder.  She was scheduled for omental biopsies later today.  The decision was made to go ahead and biopsy her bladder as this will likely provide the adequate tissue for diagnosis.  As such, cold cup biopsy forceps were used to biopsy 5-6 large areas of tumor which was passed off the field.  Notably, the tumor did have  a very solid, nodular, and almost necrotic consistency.  But electrocautery was then used to fulgurate the areas to achieve hemostasis.  The bladder was drained.  Final inspection was adequate.  The patient was then cleaned and dried, repositioned supine position  [facility], and taken to the PACU in stable condition.  Plan intraoperative findings were communicated with the patient's hospitalist and oncology teams.  We will cancel her CT-guided omental biopsy.  May advance diet.  Dependent BNO suppositories to help with bladder spasms.  Findings were also discussed with patient's husband, Tempie Donning by telephone.  Hollice Espy, M.D.

## 2020-10-11 ENCOUNTER — Telehealth: Payer: Self-pay | Admitting: Oncology

## 2020-10-11 ENCOUNTER — Telehealth: Payer: Self-pay | Admitting: Physician Assistant

## 2020-10-11 ENCOUNTER — Encounter: Payer: Self-pay | Admitting: Urology

## 2020-10-11 DIAGNOSIS — I10 Essential (primary) hypertension: Secondary | ICD-10-CM | POA: Diagnosis not present

## 2020-10-11 DIAGNOSIS — N1330 Unspecified hydronephrosis: Secondary | ICD-10-CM | POA: Diagnosis not present

## 2020-10-11 DIAGNOSIS — N1339 Other hydronephrosis: Secondary | ICD-10-CM | POA: Diagnosis not present

## 2020-10-11 DIAGNOSIS — N179 Acute kidney failure, unspecified: Secondary | ICD-10-CM | POA: Diagnosis not present

## 2020-10-11 DIAGNOSIS — N6311 Unspecified lump in the right breast, upper outer quadrant: Secondary | ICD-10-CM | POA: Diagnosis not present

## 2020-10-11 LAB — BASIC METABOLIC PANEL
Anion gap: 10 (ref 5–15)
BUN: 17 mg/dL (ref 8–23)
CO2: 21 mmol/L — ABNORMAL LOW (ref 22–32)
Calcium: 8.9 mg/dL (ref 8.9–10.3)
Chloride: 109 mmol/L (ref 98–111)
Creatinine, Ser: 2.27 mg/dL — ABNORMAL HIGH (ref 0.44–1.00)
GFR, Estimated: 24 mL/min — ABNORMAL LOW (ref >60)
Glucose, Bld: 100 mg/dL — ABNORMAL HIGH (ref 70–99)
Potassium: 4.5 mmol/L (ref 3.5–5.1)
Sodium: 140 mmol/L (ref 135–145)

## 2020-10-11 LAB — CBC
HCT: 37.3 % (ref 36.0–46.0)
Hemoglobin: 11.3 g/dL — ABNORMAL LOW (ref 12.0–15.0)
MCH: 26.5 pg (ref 26.0–34.0)
MCHC: 30.3 g/dL (ref 30.0–36.0)
MCV: 87.4 fL (ref 80.0–100.0)
Platelets: 421 10*3/uL — ABNORMAL HIGH (ref 150–400)
RBC: 4.27 MIL/uL (ref 3.87–5.11)
RDW: 13.4 % (ref 11.5–15.5)
WBC: 8.6 10*3/uL (ref 4.0–10.5)
nRBC: 0 % (ref 0.0–0.2)

## 2020-10-11 LAB — MAGNESIUM: Magnesium: 2.3 mg/dL (ref 1.7–2.4)

## 2020-10-11 MED ORDER — ACETAMINOPHEN 325 MG PO TABS: 650.0000 mg | ORAL_TABLET | Freq: Four times a day (QID) | ORAL | Status: AC | PRN

## 2020-10-11 MED ORDER — AMLODIPINE BESYLATE 10 MG PO TABS: 10.0000 mg | ORAL_TABLET | Freq: Every day | ORAL | 0 refills | Status: AC

## 2020-10-11 NOTE — Telephone Encounter (Signed)
Left VM with patient to make her aware of appt made on 10/15/20 for labs and possible IVF.

## 2020-10-11 NOTE — Discharge Instructions (Signed)
Follow with Primary MD [name], MD in 7 days   Get CBC, CMP,  checked  by Primary MD next visit.    Activity: As tolerated with Full fall precautions use walker/cane & assistance as needed   Disposition Home    Diet: Heart Healthy     On your next visit with your primary care physician please Get Medicines reviewed and adjusted.   Please request your Prim.MD to go over all Hospital Tests and Procedure/Radiological results at the follow up, please get all Hospital records sent to your Prim MD by signing hospital release before you go home.   If you experience worsening of your admission symptoms, develop shortness of breath, life threatening emergency, suicidal or homicidal thoughts you must seek medical attention immediately by calling 911 or calling your MD immediately  if symptoms less severe.  You Must read complete instructions/literature along with all the possible adverse reactions/side effects for all the Medicines you take and that have been prescribed to you. Take any new Medicines after you have completely understood and accpet all the possible adverse reactions/side effects.   Do not drive, operating heavy machinery, perform activities at heights, swimming or participation in water activities or provide baby sitting services if your were admitted for syncope or siezures until you have seen by Primary MD or a Neurologist and advised to do so again.  Do not drive when taking Pain medications.    Do not take more than prescribed Pain, Sleep and Anxiety Medications  Special Instructions: If you have smoked or chewed Tobacco  in the last 2 yrs please stop smoking, stop any regular Alcohol  and or any Recreational drug use.  Wear Seat belts while driving.   Please note  You were cared for by a hospitalist during your hospital stay. If you have any questions about your discharge medications or the care you received while you were in the hospital after you are  discharged, you can call the unit and asked to speak with the hospitalist on call if the hospitalist that took care of you is not available. Once you are discharged, your primary care physician will handle any further medical issues. Please note that NO REFILLS for any discharge medications will be authorized once you are discharged, as it is imperative that you return to your primary care physician (or establish a relationship with a primary care physician if you do not have one) for your aftercare needs so that they can reassess your need for medications and monitor your lab values.

## 2020-10-11 NOTE — Discharge Summary (Signed)
Physician Discharge Summary  Tracy Goodman EQA:[id number] DOB: 11/18/1955 DOA: 10/09/2020  PCP: [name], MD  Admit date: 10/09/2020 Discharge date: 10/11/2020  Admitted From: Home Disposition:  Home   Recommendations for Outpatient Follow-up:  1. Follow up with PCP in 1-2 weeks 2. Please obtain BMP/CBC in one week 3. Please follow up on the following pending results: Biopsy results of bladder mass  Home Health:No Equipment/Devices:None  Discharge Condition:Stable CODE STATUS:DNR Diet recommendation: Heart Healthy  Brief/Interim Summary:   AKI  -Obstructive uropathy versus contrast-induced nephropathy -FENa of 0.8%, strong possibility of contrast-induced nephropathy, continue with IV fluids. -Patient was treated with IV fluids, he was seen by urologist, where she had bilateral stent placement. -Creatinine significantly improved this morning at 2.2, she will be discharged today as discussed with oncology service, they will schedule for repeat BMP for her next week. -Was instructed to hold NSAIDs  Obstructive uropathy with bilateral hydronephrosis -Secondary to pelvic tumor of GYN origin, causing  bilateral hydronephrosis and AKI. -Urology input greatly appreciated, s/p cystoscopy, with bilateral pyelogram and bilateral ureteral stent placement and bladder biopsy.  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  So we will hold off on omental biopsy. -Follow with oncology as an outpatient   Right-sided breast mass at the 10 o'clock position-patient had a breast biopsy scheduled for 10/10/2020, however this will be canceled, oncology will reschedule biopsy as an outpatient upon discharge  Hyperlipidemia -patient was prescribed atorvastatin 10 mg nightly, however she has not started this medication -We will not start inpatient  Morbid  obesity-outpatient management with PCP as appropriate  History of hypertension -patient is currently not on any antihypertensives, but her blood pressure is elevated, she was started on Norvasc 5 mg, blood pressure remains elevated, she will increase to 10 mg upon discharge.       Discharge Diagnoses:  Principal Problem:   AKI (acute kidney injury) ([place]) Active Problems:   Hyperlipidemia   Hypertension   Morbid obesity ([place])   Umbilical hernia   Breast lump on right side at 10 o'clock position   Bilateral hydronephrosis    Discharge Instructions  Discharge Instructions    Diet - low sodium heart healthy   Complete by: As directed    Discharge instructions   Complete by: As directed    Follow with Primary MD [name], MD in 7 days   Get CBC, CMP,  checked  by Primary MD next visit.    Activity: As tolerated with Full fall precautions use walker/cane & assistance as needed   Disposition Home    Diet: Heart Healthy     On your next visit with your primary care physician please Get Medicines reviewed and adjusted.   Please request your Prim.MD to go over all Hospital Tests and Procedure/Radiological results at the follow up, please get all Hospital records sent to your Prim MD by signing hospital release before Tracy go home.   If Tracy experience worsening of your admission symptoms, develop shortness of breath, life threatening emergency, suicidal or homicidal thoughts Tracy must seek medical attention immediately by calling 911 or calling your MD immediately  if symptoms less severe.  Tracy Must read complete instructions/literature along with all the possible adverse reactions/side effects for all the Medicines Tracy take and that have been prescribed to Tracy. Take any new Medicines after Tracy have completely understood and accpet all the  possible adverse reactions/side effects.   Do not drive, operating heavy machinery, perform activities at heights, swimming or  participation in water activities or provide baby sitting services if your were admitted for syncope or siezures until Tracy have seen by Primary MD or a Neurologist and advised to do so again.  Do not drive when taking Pain medications.    Do not take more than prescribed Pain, Sleep and Anxiety Medications  Special Instructions: If Tracy have smoked or chewed Tobacco  in the last 2 yrs please stop smoking, stop any regular Alcohol  and or any Recreational drug use.  Wear Seat belts while driving.   Please note  Tracy were cared for by a hospitalist during your hospital stay. If Tracy have any questions about your discharge medications or the care Tracy received while Tracy were in the hospital after Tracy are discharged, Tracy can call the unit and asked to speak with the hospitalist on call if the hospitalist that took care of Tracy is not available. Once Tracy are discharged, your primary care physician will handle any further medical issues. Please note that NO REFILLS for any discharge medications will be authorized once Tracy are discharged, as it is imperative that Tracy return to your primary care physician (or establish a relationship with a primary care physician if you do not have one) for your aftercare needs so that they can reassess your need for medications and monitor your lab values.   Increase activity slowly   Complete by: As directed    No wound care   Complete by: As directed      Allergies as of 10/11/2020   No Known Allergies     Medication List    STOP taking these medications   ibuprofen 200 MG tablet Commonly known as: ADVIL     TAKE these medications   acetaminophen 325 MG tablet Commonly known as: TYLENOL Take 2 tablets (650 mg total) by mouth every 6 (six) hours as needed for mild pain, fever or headache (or Fever >/= 101).   amLODipine 10 MG tablet Commonly known as: NORVASC Take 1 tablet (10 mg total) by mouth daily.   atorvastatin 10 MG tablet Commonly known as:  LIPITOR Take 1 tablet (10 mg total) by mouth daily.   multivitamin with minerals Tabs tablet Take 1 tablet by mouth daily.       No Known Allergies  Consultations: Oncology Urology Radiology  Procedures/Studies: CT ABDOMEN PELVIS WO CONTRAST  Result Date: 09/30/2020 CLINICAL DATA:  65 year old female with abdominal pain greater on the left. Recent diverticulitis status post full course of antibiotics. Two episodes of bright red blood in stool yesterday. EXAM: CT ABDOMEN AND PELVIS WITHOUT CONTRAST TECHNIQUE: Multidetector CT imaging of the abdomen and pelvis was performed following the standard protocol without IV contrast. COMPARISON:  CT Abdomen and Pelvis 09/04/2020. FINDINGS: Lower chest: Small layering left pleural effusion is new since last month. No cardiomegaly or pericardial effusion. Mild lung base atelectasis. Hepatobiliary: Trace perihepatic free fluid is new since last month. Otherwise noncontrast liver and gallbladder appear stable. Pancreas: Negative. Spleen: Small volume perisplenic free fluid is new since last month. Simple fluid density. Otherwise stable noncontrast spleen. Adrenals/Urinary Tract: Normal adrenal glands. New bilateral hydronephrosis (series 2, image 32). No nephrolithiasis. Both ureters are difficult to delineate at the pelvic inlet where to generalized increased inflammation is noted from last month. Urinary bladder is diminutive. Stomach/Bowel: New free fluid layering in both gutters greater on the left. Simple fluid  density on that side (coronal image 90). Generalized mesenteric stranding in the distal omentum (coronal image 74 and series 2, image 64) superficial to the uterus and adnexa which are abnormal, described below. No dilated small or large bowel loops. Diverticulosis of the large bowel. Sigmoid colon is indistinct and inseparable from the uterine fundus on sagittal image 116. No pneumoperitoneum is identified. Negative terminal ileum. Appendix cannot  be delineated but likely courses toward the uterine fundus. Decompressed stomach and duodenum. Vascular/Lymphatic: Aortoiliac calcified atherosclerosis. Normal caliber abdominal aorta. Vascular patency is not evaluated in the absence of IV contrast. Reproductive: Moderate generalized parametrial inflammation has increased (series 2, image 63). Continued gas in the vagina. No gas identified within the endometrium. Other: Increased presacral stranding since last month. No layering pelvic free fluid. Small fat containing inguinal hernias are stable. Musculoskeletal: Lumbar facet arthropathy. No acute osseous abnormality identified. IMPRESSION: 1. Small volume new free fluid in the abdomen and pelvis. No free air identified. Increased and generalized inflammation at the pelvic inlet, in the distal omentum, and about the uterus and ovaries. Sigmoid colon appears indistinct and inseparable from the uterine fundus, with persistent gas within the vagina. Appendix not delineated. New bilateral hydronephrosis. 2. Recommend repeat CT Abdomen and Pelvis with oral and IV contrast to further characterize (and recommend sufficient oral contrast and timing delay to ensure contrast has reached the sigmoid colon). 3. Small new layering left pleural effusion. Electronically Signed   By: Genevie Ann M.D.   On: 09/30/2020 06:28   CT ABDOMEN PELVIS W CONTRAST  Result Date: 09/30/2020 CLINICAL DATA:  65 year old female with history of left lower abdominal pain. Suspected abdominal abscess or infection. EXAM: CT ABDOMEN AND PELVIS WITH CONTRAST TECHNIQUE: Multidetector CT imaging of the abdomen and pelvis was performed using the standard protocol following bolus administration of intravenous contrast. CONTRAST:  20mL OMNIPAQUE IOHEXOL 300 MG/ML  SOLN COMPARISON:  CT of the abdomen and pelvis 10/01/2018 to. FINDINGS: Lower chest: Small left pleural effusion lying dependently with some associated passive subsegmental atelectasis in the left  lower lobe. Scarring or subsegmental atelectasis in the right middle lobe also noted. Hepatobiliary: No suspicious cystic or solid hepatic lesions. No intra or extrahepatic biliary ductal dilatation. Gallbladder is normal in appearance. Pancreas: Fatty infiltration in the pancreas. No pancreatic mass. No pancreatic ductal dilatation. No pancreatic or peripancreatic fluid collections or inflammatory changes. Spleen: Unremarkable. Adrenals/Urinary Tract: Bilateral kidneys and adrenal glands are normal in appearance. No hydroureteronephrosis. Urinary bladder is nearly completely collapsed, but otherwise unremarkable in appearance. Stomach/Bowel: Stomach is completely decompressed, but otherwise unremarkable in appearance. No pathologic dilatation of small bowel or colon. Numerous colonic diverticulae are noted, without surrounding inflammatory changes to clearly indicate an acute diverticulitis at this time. Appendix is not confidently identified may be surgically absent. Vascular/Lymphatic: Atherosclerosis in the pelvic vasculature. No aneurysm or dissection noted in the abdominal or pelvic vasculature. Multiple prominent borderline enlarged and mildly enlarged retroperitoneal and pelvic lymph nodes. Specific examples include a left para-aortic lymph node measuring 1 cm in short axis adjacent to the left renal hilum (axial image 33 of series 2), 1.3 cm in short axis in the left para-aortic nodal station inferior to the left renal hilum (axial image 40 of series 2), 1 cm in short axis in the right external iliac nodal station (axial images 62 and 64 of series 2) and 1.3 cm in short axis in the right common femoral nodal station (axial image 75 of series 2). Borderline enlarged mesorectal lymph  node on the right (axial image 75 of series 2) measuring 9 mm in short axis. Reproductive: Mass-like enlargement of the cervix measuring 3.8 x 3.0 x 2.9 cm. Remainder of the uterus and ovaries are otherwise unremarkable in  appearance. Other: Trace volume of ascites. Extensive omental nodularity, most notable in the low anatomic pelvis. Bilateral inguinal hernias containing predominantly fat, although some of the lower omental nodularity extends into the right inguinal hernia. Small umbilical hernia containing omental fat. Trace volume of ascites. No pneumoperitoneum. Musculoskeletal: There are no aggressive appearing lytic or blastic lesions noted in the visualized portions of the skeleton. IMPRESSION: 1. No intra-abdominal abscess. 2. 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 Notably, the cervix has a mass-like appearance. Further evaluation with pelvic examination is recommended to exclude gynecologic malignancy. 3. Small left pleural effusion. 4. Colonic diverticulosis without evidence of acute diverticulitis at this time. 5. Additional incidental findings, as above. Electronically Signed   By: Vinnie Langton M.D.   On: 09/30/2020 11:55   US RENAL  Result Date: 10/09/2020 CLINICAL DATA:  Rising creatinine.  Advanced gyn malignancy EXAM: RENAL / URINARY TRACT ULTRASOUND COMPLETE COMPARISON:  CT abdomen pelvis 09/30/2020 FINDINGS: Right Kidney: Renal measurements: 10.8 x 5.2 x 5.6 cm = volume: 162 mL. Mild right hydronephrosis unchanged from prior CT. No renal mass. Left Kidney: Renal measurements: 11.1 x 5.2 x 5.2 cm = volume: 156 mL. Mild left hydronephrosis unchanged from recent CT. No left renal mass. Bladder: Urinary jets not seen during 10 minutes of observation. Other: Trace ascites IMPRESSION: Mild bilateral renal obstruction likely due to distal ureteral obstruction. No change from recent CT. Electronically Signed   By: Franchot Gallo M.D.   On: 10/09/2020 16:17   DG OR UROLOGY CYSTO IMAGE ([facility] ONLY)  Result Date:  10/10/2020 There is no interpretation for this exam.  This order is for images obtained during a surgical procedure.  Please See "Surgeries" Tab for more information regarding the procedure.   US BREAST LTD UNI RIGHT INC AXILLA  Result Date: 10/01/2020 CLINICAL DATA:  65 year old female presenting with a new palpable area of concern in the right breast. EXAM: DIGITAL DIAGNOSTIC UNILATERAL RIGHT MAMMOGRAM WITH TOMOSYNTHESIS AND CAD; ULTRASOUND RIGHT BREAST LIMITED TECHNIQUE: Right digital diagnostic mammography and breast tomosynthesis was performed. The images were evaluated with computer-aided detection.; Targeted ultrasound examination of the right breast was performed COMPARISON:  Previous exam(s). ACR Breast Density Category b: There are scattered areas of fibroglandular density. FINDINGS: Mammogram: A skin BB marks the site of concern reported by the patient in the upper outer right breast. A spot tangential view of this area is performed in addition to standard views. There is a round circumscribed mass with central fatty hilum measuring approximately 1.2 cm at the palpable site, most likely an intramammary lymph node. There are no additional new findings elsewhere in the right breast. On physical exam at site of concern reported by the patient I feel a discrete mass. Ultrasound: Targeted ultrasound is performed at the palpable site of concern in the right breast at 9:30 o'clock 9 cm from the nipple demonstrating a round circumscribed hypoechoic mass with central fatty hilum measuring 1.3 x 1.3 x 1.1 cm, consistent with a intramammary lymph node. There is cortical thickening measuring 0.5 cm. This corresponds to the mammographic finding. Targeted ultrasound of the right axilla demonstrates normal lymph  nodes. IMPRESSION: Abnormal intramammary lymph node in the right breast at 9:30 o'clock at the palpable site reported by the patient. RECOMMENDATION: Ultrasound-guided core needle biopsy of the right breast  mass at 9:30 o'clock. I have discussed the findings and recommendations with the patient who agrees to proceed with biopsy. The patient will be contacted by our scheduler to arrange the biopsy appointment. BI-RADS CATEGORY  4: Suspicious. Electronically Signed   By: Audie Pinto M.D.   On: 10/01/2020 11:23   MM DIAG BREAST TOMO UNI RIGHT  Result Date: 10/01/2020 CLINICAL DATA:  65 year old female presenting with a new palpable area of concern in the right breast. EXAM: DIGITAL DIAGNOSTIC UNILATERAL RIGHT MAMMOGRAM WITH TOMOSYNTHESIS AND CAD; ULTRASOUND RIGHT BREAST LIMITED TECHNIQUE: Right digital diagnostic mammography and breast tomosynthesis was performed. The images were evaluated with computer-aided detection.; Targeted ultrasound examination of the right breast was performed COMPARISON:  Previous exam(s). ACR Breast Density Category b: There are scattered areas of fibroglandular density. FINDINGS: Mammogram: A skin BB marks the site of concern reported by the patient in the upper outer right breast. A spot tangential view of this area is performed in addition to standard views. There is a round circumscribed mass with central fatty hilum measuring approximately 1.2 cm at the palpable site, most likely an intramammary lymph node. There are no additional new findings elsewhere in the right breast. On physical exam at site of concern reported by the patient I feel a discrete mass. Ultrasound: Targeted ultrasound is performed at the palpable site of concern in the right breast at 9:30 o'clock 9 cm from the nipple demonstrating a round circumscribed hypoechoic mass with central fatty hilum measuring 1.3 x 1.3 x 1.1 cm, consistent with a intramammary lymph node. There is cortical thickening measuring 0.5 cm. This corresponds to the mammographic finding. Targeted ultrasound of the right axilla demonstrates normal lymph nodes. IMPRESSION: Abnormal intramammary lymph node in the right breast at 9:30 o'clock at  the palpable site reported by the patient. RECOMMENDATION: Ultrasound-guided core needle biopsy of the right breast mass at 9:30 o'clock. I have discussed the findings and recommendations with the patient who agrees to proceed with biopsy. The patient will be contacted by our scheduler to arrange the biopsy appointment. BI-RADS CATEGORY  4: Suspicious. Electronically Signed   By: Audie Pinto M.D.   On: 10/01/2020 11:23     Subjective:   Discharge Exam: Vitals:   10/10/20 2326 10/11/20 0536  BP: (!) 160/103 (!) 159/96  Pulse: 88 (!) 102  Resp: 15 17  Temp: 97.9 F (36.6 C) 97.6 F (36.4 C)  SpO2: 100% 98%   Vitals:   10/10/20 1517 10/10/20 2041 10/10/20 2326 10/11/20 0536  BP: (!) 160/80 (!) 167/95 (!) 160/103 (!) 159/96  Pulse:  90 88 (!) 102  Resp: 16 18 15 17   Temp: 97.9 F (36.6 C) 98 F (36.7 C) 97.9 F (36.6 C) 97.6 F (36.4 C)  TempSrc: Oral Oral Oral Oral  SpO2: 95% 92% 100% 98%  Weight:      Height:        General: Pt is alert, awake, not in acute distress Cardiovascular: RRR, S1/S2 +, no rubs, no gallops Respiratory: CTA bilaterally, no wheezing, no rhonchi Abdominal: Soft, NT, ND, bowel sounds + Extremities: no edema, no cyanosis    The results of significant diagnostics from this hospitalization (including imaging, microbiology, ancillary and laboratory) are listed below for reference.     Microbiology: Recent Results (from the past 240 hour(s))  SARS CORONAVIRUS 2 (TAT 6-24 HRS) Nasopharyngeal Nasopharyngeal Swab     Status: None   Collection Time: 10/09/20  7:55 PM   Specimen: Nasopharyngeal Swab  Result Value Ref Range Status   SARS Coronavirus 2 NEGATIVE NEGATIVE Final    Comment: (NOTE) SARS-CoV-2 target nucleic acids are NOT DETECTED.  The SARS-CoV-2 RNA is generally detectable in upper and lower respiratory specimens during the acute phase of infection. Negative results do not preclude SARS-CoV-2 infection, do not rule  out co-infections with other pathogens, and should not be used as the sole basis for treatment or other patient management decisions. Negative results must be combined with clinical observations, patient history, and epidemiological information. The expected result is Negative.  Fact Sheet for Patients: SugarRoll.be  Fact Sheet for Healthcare Providers: https://www.woods-mathews.com/  This test is not yet approved or cleared by the [place] FDA and  has been authorized for detection and/or diagnosis of SARS-CoV-2 by FDA under an Emergency Use Authorization (EUA). This EUA will remain  in effect (meaning this test can be used) for the duration of the COVID-19 declaration under Se ction 564(b)(1) of the Act, 21 U.S.C. section 360bbb-3(b)(1), unless the authorization is terminated or revoked sooner.  Performed at [facility], [place] [address], [place], [place] [address]      Labs: BNP (last 3 results) Recent Labs    10/09/20 1822  BNP 47.0   Basic Metabolic Panel: Recent Labs  Lab 10/04/20 1548 10/09/20 1101 10/10/20 0322 10/11/20 0538  NA 142 142 137 140  K 3.9 4.4 4.0 4.5  CL 106 105 105 109  CO2 24 24 22  21*  GLUCOSE 99 105* 94 100*  BUN 17 24* 23 17  CREATININE 2.55* 3.52* 3.41* 2.27*  CALCIUM 8.9 8.9 8.6* 8.9  MG  --   --   --  2.3   Liver Function Tests: Recent Labs  Lab 10/04/20 1548  AST 31  ALT 20  ALKPHOS 95  BILITOT 0.4  PROT 7.7  ALBUMIN 3.4*   No results for input(s): LIPASE, AMYLASE in the last 168 hours. No results for input(s): AMMONIA in the last 168 hours. CBC: Recent Labs  Lab 10/04/20 1548 10/09/20 1822 10/10/20 0322 10/11/20 0538  WBC 6.0 6.0 6.9 8.6  NEUTROABS 3.8 4.1  --   --   HGB 12.9 11.6* 10.9* 11.3*  HCT 41.9 38.4 35.3* 37.3  MCV 87.7 86.7 85.9 87.4  PLT 393 437* 413* 421*   Cardiac Enzymes: No results for input(s): CKTOTAL, CKMB, CKMBINDEX, TROPONINI in the last  168 hours. BNP: Invalid input(s): POCBNP CBG: No results for input(s): GLUCAP in the last 168 hours. D-Dimer No results for input(s): DDIMER in the last 72 hours. Hgb A1c No results for input(s): HGBA1C in the last 72 hours. Lipid Profile No results for input(s): CHOL, HDL, LDLCALC, TRIG, CHOLHDL, LDLDIRECT in the last 72 hours. Thyroid function studies Recent Labs    10/09/20 1822  TSH 1.724   Anemia work up No results for input(s): VITAMINB12, FOLATE, FERRITIN, TIBC, IRON, RETICCTPCT in the last 72 hours. Urinalysis    Component Value Date/Time   COLORURINE STRAW (A) 10/10/2020 0811   APPEARANCEUR HAZY (A) 10/10/2020 0811   LABSPEC 1.003 (L) 10/10/2020 0811   PHURINE 6.0 10/10/2020 0811   GLUCOSEU NEGATIVE 10/10/2020 0811   HGBUR MODERATE (A) 10/10/2020 0811   BILIRUBINUR NEGATIVE 10/10/2020 0811   KETONESUR NEGATIVE 10/10/2020 0811   PROTEINUR NEGATIVE 10/10/2020 0811   NITRITE NEGATIVE 10/10/2020  [place] (A) 10/10/2020 0811   Sepsis Labs Invalid input(s): PROCALCITONIN,  WBC,  LACTICIDVEN Microbiology Recent Results (from the past 240 hour(s))  SARS CORONAVIRUS 2 (TAT 6-24 HRS) Nasopharyngeal Nasopharyngeal Swab     Status: None   Collection Time: 10/09/20  7:55 PM   Specimen: Nasopharyngeal Swab  Result Value Ref Range Status   SARS Coronavirus 2 NEGATIVE NEGATIVE Final    Comment: (NOTE) SARS-CoV-2 target nucleic acids are NOT DETECTED.  The SARS-CoV-2 RNA is generally detectable in upper and lower respiratory specimens during the acute phase of infection. Negative results do not preclude SARS-CoV-2 infection, do not rule out co-infections with other pathogens, and should not be used as the sole basis for treatment or other patient management decisions. Negative results must be combined with clinical observations, patient history, and epidemiological information. The expected result is Negative.  Fact Sheet for  Patients: SugarRoll.be  Fact Sheet for Healthcare Providers: https://www.woods-mathews.com/  This test is not yet approved or cleared by the [place] FDA and  has been authorized for detection and/or diagnosis of SARS-CoV-2 by FDA under an Emergency Use Authorization (EUA). This EUA will remain  in effect (meaning this test can be used) for the duration of the COVID-19 declaration under Se ction 564(b)(1) of the Act, 21 U.S.C. section 360bbb-3(b)(1), unless the authorization is terminated or revoked sooner.  Performed at [facility], [place] [address], [place], [place] [address]      Time coordinating discharge: Over 30 minutes  SIGNED:   Phillips Climes, MD  [facility] 10/11/2020, 1:28 PM Pager   If 7PM-7AM, please contact night-coverage [email or web address] Password [email or web address]

## 2020-10-11 NOTE — Telephone Encounter (Signed)
Per Sam please schedule her for outpatient follow-up with me or Dr. Erlene Quan in about 1 month to recheck her symptoms  App made [facility]

## 2020-10-11 NOTE — Progress Notes (Signed)
Urology Inpatient Progress Note  Subjective: No acute events overnight.  She is afebrile, VSS. Creatinine down today, 2.27.  Surgical pathology pending. Patient reports feeling well today.  She has had some mild gross hematuria but feels she is emptying her bladder well and denies flank pain, dysuria, urgency, and frequency.  Anti-infectives: Anti-infectives (From admission, onward)   Start     Dose/Rate Route Frequency Ordered Stop   10/10/20 0930  ceFAZolin (ANCEF) IVPB 2g/100 mL premix        2 g 200 mL/hr over 30 Minutes Intravenous On call to O.R. 10/10/20 0837 10/11/20 0559      Current Facility-Administered Medications  Medication Dose Route Frequency Provider Last Rate Last Admin  . 0.9 %  sodium chloride infusion   Intravenous Continuous Sindy Guadeloupe, MD 125 mL/hr at 10/11/20 0811 New Bag at 10/11/20 0811  . acetaminophen (TYLENOL) tablet 650 mg  650 mg Oral Q6H PRN Hollice Espy, MD       Or  . acetaminophen (TYLENOL) suppository 650 mg  650 mg Rectal Q6H PRN Hollice Espy, MD      . amLODipine (NORVASC) tablet 5 mg  5 mg Oral Daily Elgergawy, Silver Huguenin, MD   5 mg at 10/11/20 0809  . belladonna-opium (B&O) suppository 16.2-60mg   1 suppository Rectal Q8H PRN Hollice Espy, MD      . heparin injection 5,000 Units  5,000 Units Subcutaneous Q8H Elgergawy, Silver Huguenin, MD   5,000 Units at 10/11/20 0542  . hydrALAZINE (APRESOLINE) injection 10 mg  10 mg Intravenous Q6H PRN Hollice Espy, MD      . hydrALAZINE (APRESOLINE) tablet 25 mg  25 mg Oral Q6H PRN Hollice Espy, MD   25 mg at 10/10/20 0013  . multivitamin with minerals tablet 1 tablet  1 tablet Oral Daily Hollice Espy, MD   1 tablet at 10/10/20 0012  . ondansetron (ZOFRAN) tablet 4 mg  4 mg Oral Q6H PRN Hollice Espy, MD       Or  . ondansetron [facility]) injection 4 mg  4 mg Intravenous Q6H PRN Hollice Espy, MD      . oxybutynin (DITROPAN) tablet 5 mg  5 mg Oral Q8H PRN Hollice Espy, MD      . traMADol  Veatrice Bourbon) tablet 50 mg  50 mg Oral Q12H PRN Hollice Espy, MD   50 mg at 10/10/20 1630   Objective: Vital signs in last 24 hours: Temp:  [97.3 F (36.3 C)-98 F (36.7 C)] 97.6 F (36.4 C) (04/22 0536) Pulse Rate:  [82-105] 102 (04/22 0536) Resp:  [15-34] 17 (04/22 0536) BP: (142-193)/(80-130) 159/96 (04/22 0536) SpO2:  [92 %-100 %] 98 % (04/22 0536)  Intake/Output from previous day: 04/21 0701 - 04/22 0700 In: 1700 [P.O.:700; I.V.:1000] Out: 1275 [Urine:1275] Intake/Output this shift: No intake/output data recorded.  Physical Exam Vitals and nursing note reviewed.  Constitutional:      General: She is not in acute distress.    Appearance: She is not ill-appearing, toxic-appearing or diaphoretic.  HENT:     Head: Normocephalic and atraumatic.  Pulmonary:     Effort: Pulmonary effort is normal. No respiratory distress.  Skin:    General: Skin is warm and dry.  Neurological:     Mental Status: She is alert and oriented to person, place, and time.  Psychiatric:        Mood and Affect: Mood normal.        Behavior: Behavior normal.     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 Erlene Quan.  Creatinine is downtrending today and she is tolerating stents well without discomfort or irritative voiding symptoms.  Okay for discharge from the urologic perspective.  Will plan for outpatient follow-up in approximately  1 month to recheck her symptoms.  Debroah Loop, PA-C 10/11/2020

## 2020-10-11 NOTE — Plan of Care (Signed)
Shift Summary: Pt orientedx4, BP elevated but did not need PRN hydralazine overnight. No c/o pain, just discomfort from the bed. IV fluids infusing per MAR. Independent to bathroom, urine continues to be pink-tinged s/p procedure 4/21. Safety precautions in place, rounding performed, needs/concerns addressed during shift.   Problem: Education: Goal: Knowledge of General Education information will improve Description: Including pain rating scale, medication(s)/side effects and non-pharmacologic comfort measures Outcome: Progressing   Problem: Health Behavior/Discharge Planning: Goal: Ability to manage health-related needs will improve Outcome: Progressing   Problem: Clinical Measurements: Goal: Ability to maintain clinical measurements within normal limits will improve Outcome: Progressing Goal: Will remain free from infection Outcome: Progressing Goal: Diagnostic test results will improve Outcome: Progressing Goal: Respiratory complications will improve Outcome: Progressing Goal: Cardiovascular complication will be avoided Outcome: Progressing   Problem: Activity: Goal: Risk for activity intolerance will decrease Outcome: Progressing   Problem: Nutrition: Goal: Adequate nutrition will be maintained Outcome: Progressing   Problem: Coping: Goal: Level of anxiety will decrease Outcome: Progressing   Problem: Elimination: Goal: Will not experience complications related to bowel motility Outcome: Progressing Goal: Will not experience complications related to urinary retention Outcome: Progressing   Problem: Pain Managment: Goal: General experience of comfort will improve Outcome: Progressing   Problem: Safety: Goal: Ability to remain free from injury will improve Outcome: Progressing   Problem: Skin Integrity: Goal: Risk for impaired skin integrity will decrease Outcome: Progressing

## 2020-10-15 ENCOUNTER — Observation Stay: Payer: BC Managed Care – PPO

## 2020-10-15 ENCOUNTER — Other Ambulatory Visit: Payer: Self-pay

## 2020-10-15 ENCOUNTER — Telehealth: Payer: Self-pay

## 2020-10-15 ENCOUNTER — Inpatient Hospital Stay: Payer: BC Managed Care – PPO

## 2020-10-15 ENCOUNTER — Emergency Department: Payer: BC Managed Care – PPO

## 2020-10-15 ENCOUNTER — Inpatient Hospital Stay
Admission: EM | Admit: 2020-10-15 | Discharge: 2020-10-21 | DRG: 872 | Disposition: A | Discharge status: Home / Self Care | Payer: BC Managed Care – PPO | Attending: Internal Medicine | Admitting: Internal Medicine

## 2020-10-15 DIAGNOSIS — Z825 Family history of asthma and other chronic lower respiratory diseases: Secondary | ICD-10-CM

## 2020-10-15 DIAGNOSIS — R509 Fever, unspecified: Secondary | ICD-10-CM

## 2020-10-15 DIAGNOSIS — Z515 Encounter for palliative care: Secondary | ICD-10-CM

## 2020-10-15 DIAGNOSIS — R34 Anuria and oliguria: Secondary | ICD-10-CM | POA: Diagnosis present

## 2020-10-15 DIAGNOSIS — C786 Secondary malignant neoplasm of retroperitoneum and peritoneum: Secondary | ICD-10-CM | POA: Diagnosis present

## 2020-10-15 DIAGNOSIS — Z8249 Family history of ischemic heart disease and other diseases of the circulatory system: Secondary | ICD-10-CM

## 2020-10-15 DIAGNOSIS — N1330 Unspecified hydronephrosis: Secondary | ICD-10-CM | POA: Diagnosis not present

## 2020-10-15 DIAGNOSIS — Z6841 Body Mass Index (BMI) 40.0 and over, adult: Secondary | ICD-10-CM

## 2020-10-15 DIAGNOSIS — C569 Malignant neoplasm of unspecified ovary: Secondary | ICD-10-CM

## 2020-10-15 DIAGNOSIS — E049 Nontoxic goiter, unspecified: Secondary | ICD-10-CM | POA: Diagnosis present

## 2020-10-15 DIAGNOSIS — D649 Anemia, unspecified: Secondary | ICD-10-CM | POA: Diagnosis present

## 2020-10-15 DIAGNOSIS — N390 Urinary tract infection, site not specified: Secondary | ICD-10-CM | POA: Diagnosis present

## 2020-10-15 DIAGNOSIS — Z79899 Other long term (current) drug therapy: Secondary | ICD-10-CM

## 2020-10-15 DIAGNOSIS — N888 Other specified noninflammatory disorders of cervix uteri: Secondary | ICD-10-CM

## 2020-10-15 DIAGNOSIS — R0600 Dyspnea, unspecified: Secondary | ICD-10-CM

## 2020-10-15 DIAGNOSIS — Z20822 Contact with and (suspected) exposure to covid-19: Secondary | ICD-10-CM | POA: Diagnosis present

## 2020-10-15 DIAGNOSIS — N179 Acute kidney failure, unspecified: Secondary | ICD-10-CM | POA: Diagnosis not present

## 2020-10-15 DIAGNOSIS — A4152 Sepsis due to Pseudomonas: Secondary | ICD-10-CM | POA: Diagnosis not present

## 2020-10-15 DIAGNOSIS — K429 Umbilical hernia without obstruction or gangrene: Secondary | ICD-10-CM | POA: Diagnosis present

## 2020-10-15 DIAGNOSIS — R180 Malignant ascites: Secondary | ICD-10-CM | POA: Diagnosis present

## 2020-10-15 DIAGNOSIS — I10 Essential (primary) hypertension: Secondary | ICD-10-CM | POA: Diagnosis present

## 2020-10-15 DIAGNOSIS — G4700 Insomnia, unspecified: Secondary | ICD-10-CM | POA: Diagnosis present

## 2020-10-15 DIAGNOSIS — N990 Postprocedural (acute) (chronic) kidney failure: Secondary | ICD-10-CM

## 2020-10-15 DIAGNOSIS — N6311 Unspecified lump in the right breast, upper outer quadrant: Secondary | ICD-10-CM | POA: Diagnosis present

## 2020-10-15 DIAGNOSIS — E785 Hyperlipidemia, unspecified: Secondary | ICD-10-CM | POA: Diagnosis present

## 2020-10-15 DIAGNOSIS — N63 Unspecified lump in unspecified breast: Secondary | ICD-10-CM | POA: Diagnosis present

## 2020-10-15 DIAGNOSIS — Z66 Do not resuscitate: Secondary | ICD-10-CM | POA: Diagnosis present

## 2020-10-15 DIAGNOSIS — Z8543 Personal history of malignant neoplasm of ovary: Secondary | ICD-10-CM

## 2020-10-15 HISTORY — DX: Malignant neoplasm of unspecified ovary: C56.9

## 2020-10-15 LAB — CBC WITH DIFFERENTIAL/PLATELET
Abs Immature Granulocytes: 0.04 10*3/uL (ref 0.00–0.07)
Basophils Absolute: 0 10*3/uL (ref 0.0–0.1)
Basophils Relative: 0 %
Eosinophils Absolute: 0.1 10*3/uL (ref 0.0–0.5)
Eosinophils Relative: 1 %
HCT: 36.6 % (ref 36.0–46.0)
Hemoglobin: 11.4 g/dL — ABNORMAL LOW (ref 12.0–15.0)
Immature Granulocytes: 1 %
Lymphocytes Relative: 14 %
Lymphs Abs: 1.1 10*3/uL (ref 0.7–4.0)
MCH: 26.7 pg (ref 26.0–34.0)
MCHC: 31.1 g/dL (ref 30.0–36.0)
MCV: 85.7 fL (ref 80.0–100.0)
Monocytes Absolute: 0.8 10*3/uL (ref 0.1–1.0)
Monocytes Relative: 9 %
Neutro Abs: 6.2 10*3/uL (ref 1.7–7.7)
Neutrophils Relative %: 75 %
Platelets: 356 10*3/uL (ref 150–400)
RBC: 4.27 MIL/uL (ref 3.87–5.11)
RDW: 13.2 % (ref 11.5–15.5)
Smear Review: NORMAL
WBC: 8.2 10*3/uL (ref 4.0–10.5)
nRBC: 0 % (ref 0.0–0.2)

## 2020-10-15 LAB — BLOOD GAS, VENOUS
Acid-base deficit: 6.1 mmol/L — ABNORMAL HIGH (ref 0.0–2.0)
Bicarbonate: 18.9 mmol/L — ABNORMAL LOW (ref 20.0–28.0)
O2 Saturation: 87.9 %
Patient temperature: 37
pCO2, Ven: 35 mmHg — ABNORMAL LOW (ref 44.0–60.0)
pH, Ven: 7.34 (ref 7.250–7.430)
pO2, Ven: 58 mmHg — ABNORMAL HIGH (ref 32.0–45.0)

## 2020-10-15 LAB — URINALYSIS, COMPLETE (UACMP) WITH MICROSCOPIC
Bilirubin Urine: NEGATIVE
Glucose, UA: NEGATIVE mg/dL
Ketones, ur: NEGATIVE mg/dL
Nitrite: NEGATIVE
Protein, ur: 30 mg/dL — AB
Specific Gravity, Urine: 1.004 — ABNORMAL LOW (ref 1.005–1.030)
pH: 5 (ref 5.0–8.0)

## 2020-10-15 LAB — LACTIC ACID, PLASMA
Lactic Acid, Venous: 1.4 mmol/L (ref 0.5–1.9)
Lactic Acid, Venous: 1.1 mmol/L (ref 0.5–1.9)

## 2020-10-15 LAB — BASIC METABOLIC PANEL
Anion gap: 15 (ref 5–15)
BUN: 35 mg/dL — ABNORMAL HIGH (ref 8–23)
CO2: 17 mmol/L — ABNORMAL LOW (ref 22–32)
Calcium: 8.7 mg/dL — ABNORMAL LOW (ref 8.9–10.3)
Chloride: 101 mmol/L (ref 98–111)
Creatinine, Ser: 7.87 mg/dL — ABNORMAL HIGH (ref 0.44–1.00)
GFR, Estimated: 5 mL/min — ABNORMAL LOW (ref >60)
Glucose, Bld: 107 mg/dL — ABNORMAL HIGH (ref 70–99)
Potassium: 4.4 mmol/L (ref 3.5–5.1)
Sodium: 133 mmol/L — ABNORMAL LOW (ref 135–145)

## 2020-10-15 LAB — COMPREHENSIVE METABOLIC PANEL WITH GFR
ALT: 10 U/L (ref 0–44)
AST: 18 U/L (ref 15–41)
Albumin: 2.9 g/dL — ABNORMAL LOW (ref 3.5–5.0)
Alkaline Phosphatase: 111 U/L (ref 38–126)
Anion gap: 12 (ref 5–15)
BUN: 36 mg/dL — ABNORMAL HIGH (ref 8–23)
CO2: 18 mmol/L — ABNORMAL LOW (ref 22–32)
Calcium: 8.7 mg/dL — ABNORMAL LOW (ref 8.9–10.3)
Chloride: 99 mmol/L (ref 98–111)
Creatinine, Ser: 7.9 mg/dL — ABNORMAL HIGH (ref 0.44–1.00)
GFR, Estimated: 5 mL/min — ABNORMAL LOW
Glucose, Bld: 101 mg/dL — ABNORMAL HIGH (ref 70–99)
Potassium: 4.4 mmol/L (ref 3.5–5.1)
Sodium: 129 mmol/L — ABNORMAL LOW (ref 135–145)
Total Bilirubin: 0.8 mg/dL (ref 0.3–1.2)
Total Protein: 7.1 g/dL (ref 6.5–8.1)

## 2020-10-15 LAB — RESP PANEL BY RT-PCR (FLU A&B, COVID) ARPGX2
Influenza A by PCR: NEGATIVE
Influenza B by PCR: NEGATIVE
SARS Coronavirus 2 by RT PCR: NEGATIVE

## 2020-10-15 LAB — BRAIN NATRIURETIC PEPTIDE: B Natriuretic Peptide: 29.5 pg/mL (ref 0.0–100.0)

## 2020-10-15 LAB — MAGNESIUM: Magnesium: 2.3 mg/dL (ref 1.7–2.4)

## 2020-10-15 MED ORDER — ACETAMINOPHEN 325 MG PO TABS
650.0000 mg | ORAL_TABLET | Freq: Four times a day (QID) | ORAL | Status: AC | PRN
  Administered 2020-10-18 (×3): 650 mg via ORAL
  Filled 2020-10-15 (×3): qty 2

## 2020-10-15 MED ORDER — ONDANSETRON HCL 4 MG/2ML IJ SOLN
4.0000 mg | Freq: Four times a day (QID) | INTRAMUSCULAR | Status: DC | PRN
  Administered 2020-10-18: 4 mg via INTRAVENOUS
  Filled 2020-10-15: qty 2

## 2020-10-15 MED ORDER — TRAZODONE HCL 50 MG PO TABS
25.0000 mg | ORAL_TABLET | Freq: Every day | ORAL | Status: DC
  Administered 2020-10-15 – 2020-10-20 (×5): 25 mg via ORAL
  Filled 2020-10-15 (×4): qty 1
  Filled 2020-10-15: qty 0.5
  Filled 2020-10-15: qty 1

## 2020-10-15 MED ORDER — LABETALOL HCL 5 MG/ML IV SOLN
10.0000 mg | INTRAVENOUS | Status: DC | PRN
  Administered 2020-10-18: 10 mg via INTRAVENOUS
  Filled 2020-10-15 (×2): qty 4

## 2020-10-15 MED ORDER — HYDRALAZINE HCL 25 MG PO TABS
25.0000 mg | ORAL_TABLET | Freq: Three times a day (TID) | ORAL | Status: DC | PRN
  Administered 2020-10-15: 25 mg via ORAL
  Filled 2020-10-15: qty 1

## 2020-10-15 MED ORDER — HEPARIN SODIUM (PORCINE) 5000 UNIT/ML IJ SOLN: 5000.0000 [IU] | Freq: Three times a day (TID) | INTRAMUSCULAR | Status: DC

## 2020-10-15 MED ORDER — ONDANSETRON HCL 4 MG PO TABS: 4.0000 mg | ORAL_TABLET | Freq: Four times a day (QID) | ORAL | Status: DC | PRN

## 2020-10-15 MED ORDER — ACETAMINOPHEN 650 MG RE SUPP: 650.0000 mg | Freq: Four times a day (QID) | RECTAL | Status: AC | PRN

## 2020-10-15 MED ORDER — SODIUM BICARBONATE 650 MG PO TABS
1300.0000 mg | ORAL_TABLET | Freq: Three times a day (TID) | ORAL | Status: AC
  Administered 2020-10-15 – 2020-10-17 (×6): 1300 mg via ORAL
  Filled 2020-10-15 (×6): qty 2

## 2020-10-15 MED ORDER — ADULT MULTIVITAMIN W/MINERALS CH
1.0000 | ORAL_TABLET | Freq: Every day | ORAL | Status: DC
  Administered 2020-10-16 – 2020-10-21 (×6): 1 via ORAL
  Filled 2020-10-15 (×6): qty 1

## 2020-10-15 MED ORDER — AMLODIPINE BESYLATE 10 MG PO TABS
10.0000 mg | ORAL_TABLET | Freq: Every day | ORAL | Status: DC
  Administered 2020-10-15 – 2020-10-17 (×3): 10 mg via ORAL
  Filled 2020-10-15: qty 1
  Filled 2020-10-15: qty 2
  Filled 2020-10-15: qty 1

## 2020-10-15 NOTE — ED Notes (Addendum)
Lab at bedside   Unable to get foley catheter inserted

## 2020-10-15 NOTE — Consult Note (Addendum)
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 Rupert Stacks  Date of visit: 10/15/20   History of presenting illness- Patient is a 65 year old African-American female who was found to have widespread metastatic malignancy with omental carcinomatosis and CT scans appear that this was a GYN primary.  Cervical biopsy x1 in the office was nondiagnostic.  CT scan from 09/30/2020 revealed evidence of retroperitoneal and pelvic adenopathy as well as malignant ascites and omental caking.  Repeat biopsy in the GYN oncology clinic was also attempted but was not possible.  After the CT scan patient was noted to have elevated creatinine up to 3.5 when she was admitted to the hospital last week.  Mild bilateral hydronephrosis was noted on ultrasound and patient underwent cystoscopy and bilateral ureteral stent placements by urology.  She also underwent biopsies of the tumor which was invading the bladder.  Pathology today came back positive for high-grade serous carcinoma of GYN origin.  Patient had a follow-up BMP done in the cancer clinic today after her discharge from hospital last week.  Following stent placement her creatinine came down from 3.5-2.2 but was again elevated at 7.87  and hence the patient was sent to the ER.  Patient states that she had been drinking 40 to 50 ounces of fluids diligently as she was told to prevent any AKI.  She reports fatigue but denies other complaints.  ECOG PS- 1  Pain scale- 0   Review of systems- Review of Systems  Constitutional: Positive for malaise/fatigue. Negative for chills, fever and weight loss.  HENT: Negative for  congestion, ear discharge and nosebleeds.   Eyes: Negative for blurred vision.  Respiratory: Negative for cough, hemoptysis, sputum production, shortness of breath and wheezing.   Cardiovascular: Negative for chest pain, palpitations, orthopnea and claudication.  Gastrointestinal: Negative for abdominal pain, blood in stool, constipation, diarrhea, heartburn, melena, nausea and vomiting.  Genitourinary: Negative for dysuria, flank pain, frequency, hematuria and urgency.  Musculoskeletal: Negative for back pain, joint pain and myalgias.  Skin: Negative for rash.  Neurological: Negative for dizziness, tingling, focal weakness, seizures, weakness and headaches.  Endo/Heme/Allergies: Does not bruise/bleed easily.  Psychiatric/Behavioral: Negative for depression and suicidal ideas. The patient does not have insomnia.     No Known Allergies  Patient Active Problem List   Diagnosis Date Noted  . AKI (acute kidney injury) ([place]) 10/09/2020  . Bilateral hydronephrosis 10/09/2020  . Goals of care, counseling/discussion 10/06/2020  . Breast lump on right side at 10 o'clock position 09/23/2020  . Diverticulitis 09/23/2020  . Umbilical hernia 44/62/8638  . Enlarged thyroid gland 03/12/2020  . Hyperlipidemia 07/13/2016  . Hypertension 06/10/2016  . Morbid obesity ([place]) 06/10/2016     Past Medical History:  Diagnosis Date  . Diverticulitis   . Hypertension      Past Surgical History:  Procedure Laterality Date  . CESAREAN SECTION    . CYSTOSCOPY W/ URETERAL STENT PLACEMENT Bilateral 10/10/2020   Procedure: CYSTOSCOPY WITH RETROGRADE PYELOGRAM/URETERAL STENT PLACEMENT;  Surgeon: Hollice Espy, MD;  Location: [facility] ORS;  Service: Urology;  Laterality: Bilateral;  . FULGURATION OF BLADDER TUMOR N/A 10/10/2020   Procedure: FULGURATION OF BLADDER TUMOR;  Surgeon: Erlene Quan,  Caryl Pina, MD;  Location: [facility] ORS;  Service: Urology;  Laterality: N/A;  . WRIST FRACTURE SURGERY Left     Social History    Socioeconomic History  . Marital status: Married    Spouse name: Tempie Donning  . Number of children: 3  . Years of education: high school  . Highest education level: Not on file  Occupational History  . Not on file  Tobacco Use  . Smoking status: Never Smoker  . Smokeless tobacco: Never Used  Substance and Sexual Activity  . Alcohol use: Not Currently  . Drug use: Never  . Sexual activity: Yes    Birth control/protection: Post-menopausal  Other Topics Concern  . Not on file  Social History Narrative   03/12/20   From: [place] area   Living: with Tempie Donning, husband (2012) and one step son   Work: school bus driver      Family: 3 adult children - [place], [place], [place] Drummer - 6-7 grandchildren      Enjoys: singing in the choir at church      Exercise: weight lifting in the morning, stretches   Diet: varied, tries to eat healthy - baked salmon and fish/chicken/fried food      Safety   Seat belts: Yes    Guns: No   Safe in relationships: Yes    Social Determinants of Radio broadcast assistant Strain: Not on file  Food Insecurity: Not on file  Transportation Needs: Not on file  Physical Activity: Not on file  Stress: Not on file  Social Connections: Not on file  Intimate Partner Violence: Not on file     Family History  Problem Relation Age of Onset  . COPD Mother   . Heart failure Mother      Current Facility-Administered Medications:  .  acetaminophen (TYLENOL) tablet 650 mg, 650 mg, Oral, Q6H PRN **OR** acetaminophen (TYLENOL) suppository 650 mg, 650 mg, Rectal, Q6H PRN, Cox, Amy N, DO .  amLODipine (NORVASC) tablet 10 mg, 10 mg, Oral, Daily, Cox, Amy N, DO .  heparin injection 5,000 Units, 5,000 Units, Subcutaneous, Q8H, Cox, Amy N, DO .  multivitamin with minerals tablet 1 tablet, 1 tablet, Oral, Daily, Cox, Amy N, DO .  ondansetron (ZOFRAN) tablet 4 mg, 4 mg, Oral, Q6H PRN **OR** ondansetron (ZOFRAN) injection 4 mg, 4 mg, Intravenous, Q6H PRN, Cox, Amy N,  DO  Current Outpatient Medications:  .  acetaminophen (TYLENOL) 325 MG tablet, Take 2 tablets (650 mg total) by mouth every 6 (six) hours as needed for mild pain, fever or headache (or Fever >/= 101)., Disp: , Rfl:  .  amLODipine (NORVASC) 10 MG tablet, Take 1 tablet (10 mg total) by mouth daily., Disp: 30 tablet, Rfl: 0 .  atorvastatin (LIPITOR) 10 MG tablet, Take 1 tablet (10 mg total) by mouth daily. (Patient not taking: No sig reported), Disp: 90 tablet, Rfl: 3 .  Multiple Vitamin (MULTIVITAMIN WITH MINERALS) TABS tablet, Take 1 tablet by mouth daily., Disp: , Rfl:    Physical exam:  Vitals:   10/15/20 1156 10/15/20 1159  BP: (!) 193/120   Pulse: (!) 110   Resp: 16   Temp:  98.2 F (36.8 C)  TempSrc: Oral   SpO2: 98%   Weight: 240 lb (108.9 kg)   Height: 4' 11"  (1.499 m)    Physical Exam Constitutional:      General: She is not in acute distress.    Appearance: She is obese.  Cardiovascular:     Rate  and Rhythm: Normal rate and regular rhythm.     Heart sounds: Normal heart sounds.  Pulmonary:     Effort: Pulmonary effort is normal.     Breath sounds: Normal breath sounds.  Abdominal:     General: Bowel sounds are normal.     Palpations: Abdomen is soft.  Skin:    General: Skin is warm and dry.  Neurological:     Mental Status: She is alert and oriented to person, place, and time.        CMP Latest Ref Rng & Units 10/15/2020  Glucose 70 - 99 mg/dL 107(H)  BUN 8 - 23 mg/dL 35(H)  Creatinine 0.44 - 1.00 mg/dL 7.87(H)  Sodium 135 - 145 mmol/L 133(L)  Potassium 3.5 - 5.1 mmol/L 4.4  Chloride 98 - 111 mmol/L 101  CO2 22 - 32 mmol/L 17(L)  Calcium 8.9 - 10.3 mg/dL 8.7(L)  Total Protein 6.5 - 8.1 g/dL -  Total Bilirubin 0.3 - 1.2 mg/dL -  Alkaline Phos 38 - 126 U/L -  AST 15 - 41 U/L -  ALT 0 - 44 U/L -   CBC Latest Ref Rng & Units 10/15/2020  WBC 4.0 - 10.5 K/uL 8.2  Hemoglobin 12.0 - 15.0 g/dL 11.4(L)  Hematocrit 36.0 - 46.0 % 36.6  Platelets 150 - 400 K/uL  356    @IMAGES @  CT ABDOMEN PELVIS WO CONTRAST  Result Date: 09/30/2020 CLINICAL DATA:  65 year old female with abdominal pain greater on the left. Recent diverticulitis status post full course of antibiotics. Two episodes of bright red blood in stool yesterday. EXAM: CT ABDOMEN AND PELVIS WITHOUT CONTRAST TECHNIQUE: Multidetector CT imaging of the abdomen and pelvis was performed following the standard protocol without IV contrast. COMPARISON:  CT Abdomen and Pelvis 09/04/2020. FINDINGS: Lower chest: Small layering left pleural effusion is new since last month. No cardiomegaly or pericardial effusion. Mild lung base atelectasis. Hepatobiliary: Trace perihepatic free fluid is new since last month. Otherwise noncontrast liver and gallbladder appear stable. Pancreas: Negative. Spleen: Small volume perisplenic free fluid is new since last month. Simple fluid density. Otherwise stable noncontrast spleen. Adrenals/Urinary Tract: Normal adrenal glands. New bilateral hydronephrosis (series 2, image 32). No nephrolithiasis. Both ureters are difficult to delineate at the pelvic inlet where to generalized increased inflammation is noted from last month. Urinary bladder is diminutive. Stomach/Bowel: New free fluid layering in both gutters greater on the left. Simple fluid density on that side (coronal image 90). Generalized mesenteric stranding in the distal omentum (coronal image 74 and series 2, image 64) superficial to the uterus and adnexa which are abnormal, described below. No dilated small or large bowel loops. Diverticulosis of the large bowel. Sigmoid colon is indistinct and inseparable from the uterine fundus on sagittal image 116. No pneumoperitoneum is identified. Negative terminal ileum. Appendix cannot be delineated but likely courses toward the uterine fundus. Decompressed stomach and duodenum. Vascular/Lymphatic: Aortoiliac calcified atherosclerosis. Normal caliber abdominal aorta. Vascular patency is  not evaluated in the absence of IV contrast. Reproductive: Moderate generalized parametrial inflammation has increased (series 2, image 63). Continued gas in the vagina. No gas identified within the endometrium. Other: Increased presacral stranding since last month. No layering pelvic free fluid. Small fat containing inguinal hernias are stable. Musculoskeletal: Lumbar facet arthropathy. No acute osseous abnormality identified. IMPRESSION: 1. Small volume new free fluid in the abdomen and pelvis. No free air identified. Increased and generalized inflammation at the pelvic inlet, in the distal omentum, and about the uterus and  ovaries. Sigmoid colon appears indistinct and inseparable from the uterine fundus, with persistent gas within the vagina. Appendix not delineated. New bilateral hydronephrosis. 2. Recommend repeat CT Abdomen and Pelvis with oral and IV contrast to further characterize (and recommend sufficient oral contrast and timing delay to ensure contrast has reached the sigmoid colon). 3. Small new layering left pleural effusion. Electronically Signed   By: Genevie Ann M.D.   On: 09/30/2020 06:28   CT ABDOMEN PELVIS W CONTRAST  Result Date: 09/30/2020 CLINICAL DATA:  65 year old female with history of left lower abdominal pain. Suspected abdominal abscess or infection. EXAM: CT ABDOMEN AND PELVIS WITH CONTRAST TECHNIQUE: Multidetector CT imaging of the abdomen and pelvis was performed using the standard protocol following bolus administration of intravenous contrast. CONTRAST:  47m OMNIPAQUE IOHEXOL 300 MG/ML  SOLN COMPARISON:  CT of the abdomen and pelvis 10/01/2018 to. FINDINGS: Lower chest: Small left pleural effusion lying dependently with some associated passive subsegmental atelectasis in the left lower lobe. Scarring or subsegmental atelectasis in the right middle lobe also noted. Hepatobiliary: No suspicious cystic or solid hepatic lesions. No intra or extrahepatic biliary ductal dilatation.  Gallbladder is normal in appearance. Pancreas: Fatty infiltration in the pancreas. No pancreatic mass. No pancreatic ductal dilatation. No pancreatic or peripancreatic fluid collections or inflammatory changes. Spleen: Unremarkable. Adrenals/Urinary Tract: Bilateral kidneys and adrenal glands are normal in appearance. No hydroureteronephrosis. Urinary bladder is nearly completely collapsed, but otherwise unremarkable in appearance. Stomach/Bowel: Stomach is completely decompressed, but otherwise unremarkable in appearance. No pathologic dilatation of small bowel or colon. Numerous colonic diverticulae are noted, without surrounding inflammatory changes to clearly indicate an acute diverticulitis at this time. Appendix is not confidently identified may be surgically absent. Vascular/Lymphatic: Atherosclerosis in the pelvic vasculature. No aneurysm or dissection noted in the abdominal or pelvic vasculature. Multiple prominent borderline enlarged and mildly enlarged retroperitoneal and pelvic lymph nodes. Specific examples include a left para-aortic lymph node measuring 1 cm in short axis adjacent to the left renal hilum (axial image 33 of series 2), 1.3 cm in short axis in the left para-aortic nodal station inferior to the left renal hilum (axial image 40 of series 2), 1 cm in short axis in the right external iliac nodal station (axial images 62 and 64 of series 2) and 1.3 cm in short axis in the right common femoral nodal station (axial image 75 of series 2). Borderline enlarged mesorectal lymph node on the right (axial image 75 of series 2) measuring 9 mm in short axis. Reproductive: Mass-like enlargement of the cervix measuring 3.8 x 3.0 x 2.9 cm. Remainder of the uterus and ovaries are otherwise unremarkable in appearance. Other: Trace volume of ascites. Extensive omental nodularity, most notable in the low anatomic pelvis. Bilateral inguinal hernias containing predominantly fat, although some of the lower omental  nodularity extends into the right inguinal hernia. Small umbilical hernia containing omental fat. Trace volume of ascites. No pneumoperitoneum. Musculoskeletal: There are no aggressive appearing lytic or blastic lesions noted in the visualized portions of the skeleton. IMPRESSION: 1. No intra-abdominal abscess. 2. 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 Notably, the cervix has a mass-like appearance. Further evaluation with pelvic examination is recommended to exclude gynecologic malignancy. 3. Small left pleural effusion. 4. Colonic diverticulosis without evidence of acute diverticulitis at this time. 5. Additional incidental findings, as above. Electronically Signed  By: Vinnie Langton M.D.   On: 09/30/2020 11:55   US RENAL  Result Date: 10/09/2020 CLINICAL DATA:  Rising creatinine.  Advanced gyn malignancy EXAM: RENAL / URINARY TRACT ULTRASOUND COMPLETE COMPARISON:  CT abdomen pelvis 09/30/2020 FINDINGS: Right Kidney: Renal measurements: 10.8 x 5.2 x 5.6 cm = volume: 162 mL. Mild right hydronephrosis unchanged from prior CT. No renal mass. Left Kidney: Renal measurements: 11.1 x 5.2 x 5.2 cm = volume: 156 mL. Mild left hydronephrosis unchanged from recent CT. No left renal mass. Bladder: Urinary jets not seen during 10 minutes of observation. Other: Trace ascites IMPRESSION: Mild bilateral renal obstruction likely due to distal ureteral obstruction. No change from recent CT. Electronically Signed   By: Franchot Gallo M.D.   On: 10/09/2020 16:17   DG OR UROLOGY CYSTO IMAGE ([facility] ONLY)  Result Date: 10/10/2020 There is no interpretation for this exam.  This order is for images obtained during a surgical procedure.  Please See "Surgeries" Tab for more information regarding the procedure.   US BREAST LTD UNI  RIGHT INC AXILLA  Result Date: 10/01/2020 CLINICAL DATA:  65 year old female presenting with a new palpable area of concern in the right breast. EXAM: DIGITAL DIAGNOSTIC UNILATERAL RIGHT MAMMOGRAM WITH TOMOSYNTHESIS AND CAD; ULTRASOUND RIGHT BREAST LIMITED TECHNIQUE: Right digital diagnostic mammography and breast tomosynthesis was performed. The images were evaluated with computer-aided detection.; Targeted ultrasound examination of the right breast was performed COMPARISON:  Previous exam(s). ACR Breast Density Category b: There are scattered areas of fibroglandular density. FINDINGS: Mammogram: A skin BB marks the site of concern reported by the patient in the upper outer right breast. A spot tangential view of this area is performed in addition to standard views. There is a round circumscribed mass with central fatty hilum measuring approximately 1.2 cm at the palpable site, most likely an intramammary lymph node. There are no additional new findings elsewhere in the right breast. On physical exam at site of concern reported by the patient I feel a discrete mass. Ultrasound: Targeted ultrasound is performed at the palpable site of concern in the right breast at 9:30 o'clock 9 cm from the nipple demonstrating a round circumscribed hypoechoic mass with central fatty hilum measuring 1.3 x 1.3 x 1.1 cm, consistent with a intramammary lymph node. There is cortical thickening measuring 0.5 cm. This corresponds to the mammographic finding. Targeted ultrasound of the right axilla demonstrates normal lymph nodes. IMPRESSION: Abnormal intramammary lymph node in the right breast at 9:30 o'clock at the palpable site reported by the patient. RECOMMENDATION: Ultrasound-guided core needle biopsy of the right breast mass at 9:30 o'clock. I have discussed the findings and recommendations with the patient who agrees to proceed with biopsy. The patient will be contacted by our scheduler to arrange the biopsy appointment. BI-RADS  CATEGORY  4: Suspicious. Electronically Signed   By: Audie Pinto M.D.   On: 10/01/2020 11:23   MM DIAG BREAST TOMO UNI RIGHT  Result Date: 10/01/2020 CLINICAL DATA:  65 year old female presenting with a new palpable area of concern in the right breast. EXAM: DIGITAL DIAGNOSTIC UNILATERAL RIGHT MAMMOGRAM WITH TOMOSYNTHESIS AND CAD; ULTRASOUND RIGHT BREAST LIMITED TECHNIQUE: Right digital diagnostic mammography and breast tomosynthesis was performed. The images were evaluated with computer-aided detection.; Targeted ultrasound examination of the right breast was performed COMPARISON:  Previous exam(s). ACR Breast Density Category b: There are scattered areas of fibroglandular density. FINDINGS: Mammogram: A skin BB marks the site of concern reported by the patient in the upper  outer right breast. A spot tangential view of this area is performed in addition to standard views. There is a round circumscribed mass with central fatty hilum measuring approximately 1.2 cm at the palpable site, most likely an intramammary lymph node. There are no additional new findings elsewhere in the right breast. On physical exam at site of concern reported by the patient I feel a discrete mass. Ultrasound: Targeted ultrasound is performed at the palpable site of concern in the right breast at 9:30 o'clock 9 cm from the nipple demonstrating a round circumscribed hypoechoic mass with central fatty hilum measuring 1.3 x 1.3 x 1.1 cm, consistent with a intramammary lymph node. There is cortical thickening measuring 0.5 cm. This corresponds to the mammographic finding. Targeted ultrasound of the right axilla demonstrates normal lymph nodes. IMPRESSION: Abnormal intramammary lymph node in the right breast at 9:30 o'clock at the palpable site reported by the patient. RECOMMENDATION: Ultrasound-guided core needle biopsy of the right breast mass at 9:30 o'clock. I have discussed the findings and recommendations with the patient who  agrees to proceed with biopsy. The patient will be contacted by our scheduler to arrange the biopsy appointment. BI-RADS CATEGORY  4: Suspicious. Electronically Signed   By: Audie Pinto M.D.   On: 10/01/2020 11:23   CT Renal Stone Study  Result Date: 10/15/2020 CLINICAL DATA:  Renal insufficiency. EXAM: CT ABDOMEN AND PELVIS WITHOUT CONTRAST TECHNIQUE: Multidetector CT imaging of the abdomen and pelvis was performed following the standard protocol without IV contrast. COMPARISON:  09/30/2020. FINDINGS: Lower chest: Basilar atelectasis again noted bilaterally with interval increase in posterior left lower lobe collapse/consolidative opacity. Small left pleural effusion has progressed in the interval. Hepatobiliary: No focal abnormality in the liver on this study without intravenous contrast. Layering sludge noted in the distended gallbladder. No intrahepatic or extrahepatic biliary dilation. Pancreas: No focal mass lesion. No dilatation of the main duct. No intraparenchymal cyst. No peripancreatic edema. Spleen: No splenomegaly. No focal mass lesion. Adrenals/Urinary Tract: No adrenal nodule or mass. Mild hydronephrosis in both kidneys is similar to prior with bilateral internal ureteral stents visualized. No associated hydroureter. The bladder is decompressed. Stomach/Bowel: Tiny hiatal hernia. Stomach otherwise unremarkable. Duodenum is normally positioned as is the ligament of Treitz. No small bowel wall thickening. No small bowel dilatation. The terminal ileum is normal. The appendix is not well visualized, but there is no edema or inflammation in the region of the cecum. No gross colonic mass. No colonic wall thickening. Diverticular changes are noted in the left colon without evidence of diverticulitis. Vascular/Lymphatic: No abdominal aortic aneurysm. No change in the retroperitoneal abdominopelvic lymphadenopathy, better characterized on the recent CT scan performed with intravenous contrast  material. Reproductive: The uterus is unremarkable. Similar prominence of the cervix. There is no adnexal mass. Other: Interval increase in small to moderate volume abdominopelvic ascites with edema and nodularity in the omentum inferiorly, as before. Musculoskeletal: Small bilateral groin hernias contain only fat. No worrisome lytic or sclerotic osseous abnormality. IMPRESSION: 1. Mild bilateral hydronephrosis is similar to prior, now with bilateral internal ureteral stents in situ. No evidence for hydroureter in the bladder is decompressed. 2. Similar appearance of upper normal to mildly enlarged retroperitoneal and pelvic sidewall lymphadenopathy. This was better evaluated on recent CT with IV contrast. 3. Interval progression of ascites, now small to moderate volume. There is associated edema/caking in the omentum concerning for metastatic disease. 4. Small bilateral groin hernias contain only fat. 5. Left colonic diverticulosis without diverticulitis. Electronically Signed  By: Misty Stanley M.D.   On: 10/15/2020 13:06    Assessment and plan- Patient is a 65 y.o. 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  Patient has been diligently drinking 40 to 50 ounces of fluids daily.  Given the significant increase in her creatinine that is still a concern for ongoing obstruction despite ureteral stent placement and therefore I asked her to go to the ER.  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  Given the extent of the disease as well as omental carcinomatosis and bladder involvement it remains unclear if she would be a candidate for surgery down the line.  She would ideally needs upfront chemotherapy with a combination of carboplatin and Taxol but it would not be possible to offer chemotherapy if her AKI does not improve.  We have also sent off MSI, PD-L1 and HER2 testing on her tumor.  Patient has a strong sense of faith and is presently unsure if she would take chemotherapy if offered in the future.  Certainly her performance status is good to receive chemotherapy.  She would like to think more about this with her husband today and decide in due course if she would like to take of chemotherapy.  Patient understands that even if chemotherapy is offered, treatment would be palliative and not curative.  If she decides not to do chemotherapy certainly best supportive care/hospice would be a reasonable consideration since her prognosis is less than 6 months.  I will also have palliative care see her tomorrow morning and discuss this further.  Patient and her husband comprehend my plan well   Thank you for this kind referral and the opportunity to participate in the care of this patient   Visit Diagnosis 1. Acute kidney failure due to procedure   2. AKI (acute kidney injury) ([place])     Dr. Randa Evens, MD, MPH [facility] at [facility] [phone] 10/15/2020

## 2020-10-15 NOTE — ED Notes (Signed)
Called Lab to draw [employer]

## 2020-10-15 NOTE — Progress Notes (Signed)
This 65 yo female pt ws admitted to room 207 form the ED with elevated creatinine and low sodium.  Recent dx of gyn CA, HLD, new R breast mass, and morbid obesity. Pt placed on heart monitor and admission profile completed. VSS and pt denies any pain or concerns.

## 2020-10-15 NOTE — ED Triage Notes (Signed)
Pt states she was sent from the cancer center for elevated BUN and creat, states she was just released from the hospital recently for renal stent placement, states she was at the cancer center to get fluids. Pt denies any pain or other issues at this time

## 2020-10-15 NOTE — H&P (Signed)
History and Physical   Tracy Goodman IZT:[id number] DOB: May 01, 1956 DOA: 10/15/2020  PCP: [name], MD  Outpatient Specialists: Dr. Janese Banks Patient coming from: [place]  I have personally briefly reviewed patient's old medical records in [place].  Chief Concern: Abnormal labs  HPI: Tracy Goodman is a 65 y.o. 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  She states that otherwise she has no complaints including chest pain, shortness of breath, abdominal pain, fever, chills, cough, diarrhea.  She states she was told by her oncology doctor to drink 62 oz of water per day.  She states that she has been compliant with her home medication and increase fluid intake.  Social history: She lives at home with her spouse and stepson.  She denies tobacco, EtOH, recreational drug use.  Previously she worked as a Teacher, early years/pre.  Vaccination: Patient is vaccinated for COVID-19, 2 doses of Pfizer, no booster  ROS: Constitutional: no weight change, no fever ENT/Mouth: no sore throat, no rhinorrhea Eyes: no eye pain, no vision changes Cardiovascular: no chest pain, no dyspnea,  no edema, no palpitations Respiratory: no cough, no sputum, no wheezing Gastrointestinal: no nausea, no vomiting, no diarrhea, no constipation Genitourinary: no urinary incontinence, no dysuria, no hematuria Musculoskeletal: no arthralgias, no myalgias Skin: no skin lesions, no pruritus, Neuro: + weakness, no loss of consciousness, no syncope Psych: no anxiety, no depression, + decrease appetite Heme/Lymph: no bruising, no bleeding  ED Course: Discussed with ED provider, patient requiring hospitalization for acute kidney injury.  Vitals in the emergency department was remarkable for temperature of 98.2,  heart rate in 109, blood pressure 173/127, SPO2 97% on room air.  Labs in the emergency department was remarkable for sodium 133, potassium 4.4, chloride 101, bicarb 17, BUN 35, serum creatinine 7.87, nonfasting blood glucose 107, lactic acid 1.4, WBC 8.2, hemoglobin 11.4, platelets 356.  COVID PCR, influenza A, influenza B were negative.  Assessment/Plan  Principal Problem:   AKI (acute kidney injury) ([place]) Active Problems:   Hyperlipidemia   Hypertension   Morbid obesity ([place])   Enlarged thyroid gland   Umbilical hernia   Breast lump on right side at 10 o'clock position   Bilateral hydronephrosis   Acute kidney injury- etiology work-up in progress - Differentials include cardiorenal versus extra urological obstruction - Checking BNP - Nephrology, urology have been consulted - Urology requests Foley insertion  Hypertension- elevated, patient states she has not taken her antihypertensive medications today - Resumed home amlodipine 10 mg daily - As needed antihypertensives in place  DVT prophylaxis- pharmacologic DVT prophylaxis has not been started in anticipation of urologic procedure in the a.m. - TED hose - Primary a.m. 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 labs: CBC, BMP  Chart reviewed.   DVT prophylaxis: TED hose Code Status: Full code Diet: Heart healthy, n.p.o. at midnight Family Communication: Updated spouse at bedside Disposition Plan: Pending clinical course Consults called: Nephrology, urology, hematology oncology Admission status: MedSurg, observation, with telemetry  Past Medical History:  Diagnosis  Date  . Diverticulitis   .  Hypertension    Past Surgical History:  Procedure Laterality Date  . CESAREAN SECTION    . CYSTOSCOPY W/ URETERAL STENT PLACEMENT Bilateral 10/10/2020   Procedure: CYSTOSCOPY WITH RETROGRADE PYELOGRAM/URETERAL STENT PLACEMENT;  Surgeon: Hollice Espy, MD;  Location: [facility] ORS;  Service: Urology;  Laterality: Bilateral;  . FULGURATION OF BLADDER TUMOR N/A 10/10/2020   Procedure: FULGURATION OF BLADDER TUMOR;  Surgeon: Hollice Espy, MD;  Location: [facility] ORS;  Service: Urology;  Laterality: N/A;  . WRIST FRACTURE SURGERY Left    Social History:  reports that she has never smoked. She has never used smokeless tobacco. She reports previous alcohol use. She reports that she does not use drugs.  No Known Allergies Family History  Problem Relation Age of Onset  . COPD Mother   . Heart failure Mother    Family history: Family history reviewed and not pertinent  Prior to Admission medications   Medication Sig Start Date End Date Taking? Authorizing Provider  acetaminophen (TYLENOL) 325 MG tablet Take 2 tablets (650 mg total) by mouth every 6 (six) hours as needed for mild pain, fever or headache (or Fever >/= 101). 10/11/20   Elgergawy, Silver Huguenin, MD  amLODipine (NORVASC) 10 MG tablet Take 1 tablet (10 mg total) by mouth daily. 10/11/20 2020-11-19  Elgergawy, Silver Huguenin, MD  atorvastatin (LIPITOR) 10 MG tablet Take 1 tablet (10 mg total) by mouth daily. Patient not taking: No sig reported 09/24/20   [name], MD  Multiple Vitamin (MULTIVITAMIN WITH MINERALS) TABS tablet Take 1 tablet by mouth daily.    [provider]   Physical Exam: Vitals:   10/15/20 1634 10/15/20 1727 10/15/20 1758 10/15/20 2031  BP: (!) 163/72 (!) 163/107 (!) 143/82 125/60  Pulse: (!) 103  (!) 107 (!) 120  Resp: (!) 22  20 20   Temp:   98.4 F (36.9 C) 98.3 F (36.8 C)  TempSrc:    Oral  SpO2: 96%  99% 97%  Weight:      Height:       Constitutional: appears age appropriate,  NAD, calm, comfortable Eyes: PERRL, lids and conjunctivae normal ENMT: Mucous membranes are moist. Posterior pharynx clear of any exudate or lesions. Age-appropriate dentition. Hearing appropriate Neck: normal, supple, no masses, no thyromegaly Respiratory: clear to auscultation bilaterally, no wheezing, no crackles. Normal respiratory effort. No accessory muscle use.  Cardiovascular: Regular rate and rhythm, no murmurs / rubs / gallops. No extremity edema. 2+ pedal pulses. No carotid bruits.  Abdomen: Morbidly obese abdomen, no tenderness, no masses palpated, no hepatosplenomegaly. Bowel sounds positive.  Musculoskeletal: no clubbing / cyanosis. No joint deformity upper and lower extremities. Good ROM, no contractures, no atrophy. Normal muscle tone.  Skin: no rashes, lesions, ulcers. No induration Neurologic: Sensation intact. Strength 5/5 in all 4.  Psychiatric: Normal judgment and insight. Alert and oriented x 3. Normal mood.   EKG: Not indicated  Chest x-ray on Admission: I personally reviewed and I agree with radiologist reading as below.  DG Chest Port 1 View  Result Date: 10/15/2020 CLINICAL DATA:  AK I EXAM: PORTABLE CHEST 1 VIEW COMPARISON:  Same day CT abdomen pelvis and chest radiograph September 04, 2020. FINDINGS: The heart size and mediastinal contours are within normal limits. Low lung volumes with bibasilar linear atelectasis. Small left pleural effusion with adjacent consolidative opacity. No visible pneumothorax. The visualized skeletal structures are unchanged. IMPRESSION: Small left pleural effusion with adjacent left lower lobe consolidative opacity, which may reflect atelectasis and/or infection. Electronically  Signed   By: Dahlia Bailiff MD   On: 10/15/2020 16:03   CT Renal Stone Study  Result Date: 10/15/2020 CLINICAL DATA:  Renal insufficiency. EXAM: CT ABDOMEN AND PELVIS WITHOUT CONTRAST TECHNIQUE: Multidetector CT imaging of the abdomen and pelvis was performed following  the standard protocol without IV contrast. COMPARISON:  09/30/2020. FINDINGS: Lower chest: Basilar atelectasis again noted bilaterally with interval increase in posterior left lower lobe collapse/consolidative opacity. Small left pleural effusion has progressed in the interval. Hepatobiliary: No focal abnormality in the liver on this study without intravenous contrast. Layering sludge noted in the distended gallbladder. No intrahepatic or extrahepatic biliary dilation. Pancreas: No focal mass lesion. No dilatation of the main duct. No intraparenchymal cyst. No peripancreatic edema. Spleen: No splenomegaly. No focal mass lesion. Adrenals/Urinary Tract: No adrenal nodule or mass. Mild hydronephrosis in both kidneys is similar to prior with bilateral internal ureteral stents visualized. No associated hydroureter. The bladder is decompressed. Stomach/Bowel: Tiny hiatal hernia. Stomach otherwise unremarkable. Duodenum is normally positioned as is the ligament of Treitz. No small bowel wall thickening. No small bowel dilatation. The terminal ileum is normal. The appendix is not well visualized, but there is no edema or inflammation in the region of the cecum. No gross colonic mass. No colonic wall thickening. Diverticular changes are noted in the left colon without evidence of diverticulitis. Vascular/Lymphatic: No abdominal aortic aneurysm. No change in the retroperitoneal abdominopelvic lymphadenopathy, better characterized on the recent CT scan performed with intravenous contrast material. Reproductive: The uterus is unremarkable. Similar prominence of the cervix. There is no adnexal mass. Other: Interval increase in small to moderate volume abdominopelvic ascites with edema and nodularity in the omentum inferiorly, as before. Musculoskeletal: Small bilateral groin hernias contain only fat. No worrisome lytic or sclerotic osseous abnormality. IMPRESSION: 1. Mild bilateral hydronephrosis is similar to prior, now with  bilateral internal ureteral stents in situ. No evidence for hydroureter in the bladder is decompressed. 2. Similar appearance of upper normal to mildly enlarged retroperitoneal and pelvic sidewall lymphadenopathy. This was better evaluated on recent CT with IV contrast. 3. Interval progression of ascites, now small to moderate volume. There is associated edema/caking in the omentum concerning for metastatic disease. 4. Small bilateral groin hernias contain only fat. 5. Left colonic diverticulosis without diverticulitis. Electronically Signed   By: Misty Stanley M.D.   On: 10/15/2020 13:06   Labs on Admission: I have personally reviewed following labs  CBC: Recent Labs  Lab 10/09/20 1822 10/10/20 0322 10/11/20 0538 10/15/20 1249  WBC 6.0 6.9 8.6 8.2  NEUTROABS 4.1  --   --  6.2  HGB 11.6* 10.9* 11.3* 11.4*  HCT 38.4 35.3* 37.3 36.6  MCV 86.7 85.9 87.4 85.7  PLT 437* 413* 421* 097   Basic Metabolic Panel: Recent Labs  Lab 10/09/20 1101 10/10/20 0322 10/11/20 0538 10/15/20 1029 10/15/20 1514  NA 142 137 140 133* 129*  K 4.4 4.0 4.5 4.4 4.4  CL 105 105 109 101 99  CO2 24 22 21* 17* 18*  GLUCOSE 105* 94 100* 107* 101*  BUN 24* 23 17 35* 36*  CREATININE 3.52* 3.41* 2.27* 7.87* 7.90*  CALCIUM 8.9 8.6* 8.9 8.7* 8.7*  MG  --   --  2.3  --   --    GFR: Estimated Creatinine Clearance: 8 mL/min (A) (by C-G formula based on SCr of 7.9 mg/dL (H)).  Urine analysis:    Component Value Date/Time   COLORURINE YELLOW (A) 10/15/2020 1630   APPEARANCEUR  HAZY (A) 10/15/2020 1630   LABSPEC 1.004 (L) 10/15/2020 1630   PHURINE 5.0 10/15/2020 1630   GLUCOSEU NEGATIVE 10/15/2020 1630   HGBUR LARGE (A) 10/15/2020 1630   BILIRUBINUR NEGATIVE 10/15/2020 1630   KETONESUR NEGATIVE 10/15/2020 1630   PROTEINUR 30 (A) 10/15/2020 1630   NITRITE NEGATIVE 10/15/2020 1630   LEUKOCYTESUR LARGE (A) 10/15/2020 1630   Zaydyn Havey N Dorothyann Mourer D.O. [facility]  If 7PM-7AM, please contact overnight-coverage  provider If 7AM-7PM, please contact day coverage provider [email or web address]  10/15/2020, 8:52 PM

## 2020-10-15 NOTE — ED Provider Notes (Signed)
Medical screening examination/treatment/procedure(s) were conducted as a shared visit with non-physician practitioner(s) and myself.  I personally evaluated the patient during the encounter.  Patient has no questions or concerns at this time.  Resting comfortably.  Currently being admitted to the hospital for urology consultation and further treatment under hospitalist service.  She is awake alert hemodynamically stable.   Delman Kitten, MD 10/15/20 [phone]

## 2020-10-15 NOTE — Telephone Encounter (Signed)
Pathology request sent for PD-L1 and Her-2 neu on specimen [id number].

## 2020-10-15 NOTE — Consult Note (Signed)
Brief consult note  Tracy Goodman is a 65 y.o. 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 Erlene Quan for management of malignant compression of the distal ureters who is readmitted today with AKI, creatinine 7.87. CT stone study today reveals stable mild bilateral hydronephrosis.  Etiology of AKI is likely obstructive, with failed bilateral ureteral stents due to tumor compression of the distal ureters.  Dr. Erlene Quan will see the patient in the morning and discuss treatment options including bilateral nephrostomy tube placement, however we recommend Foley catheter placement for maximum urine decompression overnight.  Please keep her n.p.o. at midnight in advance of likely procedure tomorrow.

## 2020-10-15 NOTE — ED Notes (Signed)
Patient transported to CT 

## 2020-10-15 NOTE — ED Notes (Signed)
ED Provider at bedside. 

## 2020-10-15 NOTE — ED Provider Notes (Signed)
[facility] Emergency Department Provider Note   ____________________________________________   Event Date/Time   First MD Initiated Contact with Patient 10/15/20 1208     (approximate)  I have reviewed the triage vital signs and the nursing notes.   HISTORY  Chief Complaint Abnormal Lab  HPI Tracy Goodman is a 65 y.o. female presents to the ED from the cancer center, where she presented for routine fluid/electrolyte infusion.  Patient had a recent admission to the ED for bilateral retrograde pyelogram with bilateral ureteral stent placement.  Patient has a history of a pelvic tumor of GYN origin.  She presents today, after she was found to have an acutely elevated BUN/creatinine at the cancer center.  Her previous BUN/creat from 10/11/2020, was 17 and 2.27 respectively.  Today she was found to have a sharp increase of 35 and 7.87.  Patient reports she is feeling well at this time without any complaints of fever, chills, sweats, abdominal pain, or back pain.  She does note some oliguria as of this morning.     Past Medical History:  Diagnosis Date  . Diverticulitis   . Hypertension     Patient Active Problem List   Diagnosis Date Noted  . AKI (acute kidney injury) ([place]) 10/09/2020  . Bilateral hydronephrosis 10/09/2020  . Goals of care, counseling/discussion 10/06/2020  . Breast lump on right side at 10 o'clock position 09/23/2020  . Diverticulitis 09/23/2020  . Umbilical hernia 86/76/7209  . Enlarged thyroid gland 03/12/2020  . Hyperlipidemia 07/13/2016  . Hypertension 06/10/2016  . Morbid obesity ([place]) 06/10/2016    Past Surgical History:  Procedure Laterality Date  . CESAREAN SECTION    . CYSTOSCOPY W/ URETERAL STENT PLACEMENT Bilateral 10/10/2020   Procedure: CYSTOSCOPY WITH RETROGRADE PYELOGRAM/URETERAL STENT PLACEMENT;  Surgeon: Hollice Espy, MD;  Location: [facility] ORS;  Service: Urology;  Laterality: Bilateral;  . FULGURATION OF BLADDER  TUMOR N/A 10/10/2020   Procedure: FULGURATION OF BLADDER TUMOR;  Surgeon: Hollice Espy, MD;  Location: [facility] ORS;  Service: Urology;  Laterality: N/A;  . WRIST FRACTURE SURGERY Left     Prior to Admission medications   Medication Sig Start Date End Date Taking? Authorizing Provider  acetaminophen (TYLENOL) 325 MG tablet Take 2 tablets (650 mg total) by mouth every 6 (six) hours as needed for mild pain, fever or headache (or Fever >/= 101). 10/11/20   Elgergawy, Silver Huguenin, MD  amLODipine (NORVASC) 10 MG tablet Take 1 tablet (10 mg total) by mouth daily. 10/11/20 11-15-20  Elgergawy, Silver Huguenin, MD  atorvastatin (LIPITOR) 10 MG tablet Take 1 tablet (10 mg total) by mouth daily. Patient not taking: No sig reported 09/24/20   [name], MD  Multiple Vitamin (MULTIVITAMIN WITH MINERALS) TABS tablet Take 1 tablet by mouth daily.    [provider]    Allergies Patient has no known allergies.  Family History  Problem Relation Age of Onset  . COPD Mother   . Heart failure Mother     Social History Social History   Tobacco Use  . Smoking status: Never Smoker  . Smokeless tobacco: Never Used  Substance Use Topics  . Alcohol use: Not Currently  . Drug use: Never    Review of Systems  Constitutional: No fever/chills Eyes: No visual changes. ENT: No sore throat. Cardiovascular: Denies chest pain. Respiratory: Denies shortness of breath. Gastrointestinal: No abdominal pain.  No nausea, no vomiting.  No diarrhea.  No constipation.  No CVA tenderness elicited. Genitourinary: Negative  for dysuria.  Reports oliguria as above. Musculoskeletal: Negative for back pain. Skin: Negative for rash. Neurological: Negative for headaches, focal weakness or numbness. ____________________________________________   PHYSICAL EXAM:  VITAL SIGNS: ED Triage Vitals  Enc Vitals Group     BP 10/15/20 1156 (!) 193/120     Pulse Rate 10/15/20 1156 (!) 110     Resp 10/15/20 1156 16     Temp  10/15/20 1159 98.2 F (36.8 C)     Temp Source 10/15/20 1156 Oral     SpO2 10/15/20 1156 98 %     Weight 10/15/20 1156 240 lb (108.9 kg)     Height 10/15/20 1156 4\' 11"  (1.499 m)     Head Circumference --      Peak Flow --      Pain Score 10/15/20 1156 0     Pain Loc --      Pain Edu? --      Excl. in [place]? --    Constitutional: Alert and oriented. Well appearing and in no acute distress. Eyes: Conjunctivae are normal. PERRL. EOMI. Head: Atraumatic. Nose: No congestion/rhinnorhea. Mouth/Throat: Mucous membranes are moist.  Oropharynx non-erythematous. Neck: No stridor.   Cardiovascular: Normal rate, regular rhythm. Grossly normal heart sounds.  Good peripheral circulation. Respiratory: Normal respiratory effort.  No retractions. Lungs CTAB. Gastrointestinal: Soft and nontender. No distention, rebound, guarding, or rigidity.  No abdominal bruits. No CVA tenderness. Musculoskeletal: No lower extremity tenderness nor edema.  No joint effusions. Neurologic:  Normal speech and language. No gross focal neurologic deficits are appreciated. No gait instability. Skin:  Skin is warm, dry and intact. No rash noted. Psychiatric: Mood and affect are normal. Speech and behavior are normal.  ____________________________________________   LABS (all labs ordered are listed, but only abnormal results are displayed)  Labs Reviewed  CBC WITH DIFFERENTIAL/PLATELET - Abnormal; Notable for the following components:      Result Value   Hemoglobin 11.4 (*)    All other components within normal limits  RESP PANEL BY RT-PCR (FLU A&B, COVID) ARPGX2  LACTIC ACID, PLASMA  URINALYSIS, COMPLETE (UACMP) WITH MICROSCOPIC  LACTIC ACID, PLASMA  COMPREHENSIVE METABOLIC PANEL   ____________________________________________  EKG  ____________________________________________  RADIOLOGY I, Melvenia Needles, personally viewed and evaluated these images (plain radiographs) as part of my medical decision  making, as well as reviewing the written report by the radiologist.  ED MD interpretation:  agree with report  Official radiology report(s): CT Renal Stone Study  Result Date: 10/15/2020 CLINICAL DATA:  Renal insufficiency. EXAM: CT ABDOMEN AND PELVIS WITHOUT CONTRAST TECHNIQUE: Multidetector CT imaging of the abdomen and pelvis was performed following the standard protocol without IV contrast. COMPARISON:  09/30/2020. FINDINGS: Lower chest: Basilar atelectasis again noted bilaterally with interval increase in posterior left lower lobe collapse/consolidative opacity. Small left pleural effusion has progressed in the interval. Hepatobiliary: No focal abnormality in the liver on this study without intravenous contrast. Layering sludge noted in the distended gallbladder. No intrahepatic or extrahepatic biliary dilation. Pancreas: No focal mass lesion. No dilatation of the main duct. No intraparenchymal cyst. No peripancreatic edema. Spleen: No splenomegaly. No focal mass lesion. Adrenals/Urinary Tract: No adrenal nodule or mass. Mild hydronephrosis in both kidneys is similar to prior with bilateral internal ureteral stents visualized. No associated hydroureter. The bladder is decompressed. Stomach/Bowel: Tiny hiatal hernia. Stomach otherwise unremarkable. Duodenum is normally positioned as is the ligament of Treitz. No small bowel wall thickening. No small bowel dilatation. The terminal ileum is normal.  The appendix is not well visualized, but there is no edema or inflammation in the region of the cecum. No gross colonic mass. No colonic wall thickening. Diverticular changes are noted in the left colon without evidence of diverticulitis. Vascular/Lymphatic: No abdominal aortic aneurysm. No change in the retroperitoneal abdominopelvic lymphadenopathy, better characterized on the recent CT scan performed with intravenous contrast material. Reproductive: The uterus is unremarkable. Similar prominence of the cervix.  There is no adnexal mass. Other: Interval increase in small to moderate volume abdominopelvic ascites with edema and nodularity in the omentum inferiorly, as before. Musculoskeletal: Small bilateral groin hernias contain only fat. No worrisome lytic or sclerotic osseous abnormality. IMPRESSION: 1. Mild bilateral hydronephrosis is similar to prior, now with bilateral internal ureteral stents in situ. No evidence for hydroureter in the bladder is decompressed. 2. Similar appearance of upper normal to mildly enlarged retroperitoneal and pelvic sidewall lymphadenopathy. This was better evaluated on recent CT with IV contrast. 3. Interval progression of ascites, now small to moderate volume. There is associated edema/caking in the omentum concerning for metastatic disease. 4. Small bilateral groin hernias contain only fat. 5. Left colonic diverticulosis without diverticulitis. Electronically Signed   By: Misty Stanley M.D.   On: 10/15/2020 13:06  ____________________________________________   PROCEDURES  Procedure(s) performed (including Critical Care):  Procedures ____________________________________________   INITIAL IMPRESSION / ASSESSMENT AND PLAN / ED COURSE  As part of my medical decision making, I reviewed the following data within the [place] History obtained from family, Labs reviewed elevated BUN/Cr, Old chart reviewed, Discussed with admitting physician Cox, DO and Notes from prior ED visits  Patient to the ED for evaluation of acute kidney injury in the face of a recent bilateral renal placement.  She was referred from the cancer center after her BUNs/creatinine was elevated today.  Patient without any acute complaints.  She will be admitted to the hospital service, and urology has also been consulted.  Dr. Janese Banks has been in touch with Dr. Hollice Espy via secure chat.  Patient will have her labs reevaluated tomorrow, and the plan will be to reassess for possible  nephrostomy.  Patient is agreeable to the plan at this time.    ----------------------------------------- 2:52 PM on 10/15/2020 ----------------------------------------- Awaiting callback from hospitalist for pending admission. S/W Dr. Tobie Poet: she will see the patient in the ED. ____________________________________________   FINAL CLINICAL IMPRESSION(S) / ED DIAGNOSES  Final diagnoses:  Acute kidney failure due to procedure     ED Discharge Orders    None      *Please note:  Tracy Goodman was evaluated in Emergency Department on 10/15/2020 for the symptoms described in the history of present illness. She was evaluated in the context of the global COVID-19 pandemic, which necessitated consideration that the patient might be at risk for infection with the SARS-CoV-2 virus that causes COVID-19. Institutional protocols and algorithms that pertain to the evaluation of patients at risk for COVID-19 are in a state of rapid change based on information released by regulatory bodies including the CDC and federal and state organizations. These policies and algorithms were followed during the patient's care in the ED.  Some ED evaluations and interventions may be delayed as a result of limited staffing during and the pandemic.*   Note:  This document was prepared using Dragon voice recognition software and may include unintentional dictation errors.    Melvenia Needles, PA-C 10/15/20 1505    Delman Kitten, MD 10/15/20 [phone]

## 2020-10-16 ENCOUNTER — Encounter: Payer: Self-pay | Admitting: Internal Medicine

## 2020-10-16 ENCOUNTER — Ambulatory Visit: Payer: BC Managed Care – PPO

## 2020-10-16 DIAGNOSIS — Z825 Family history of asthma and other chronic lower respiratory diseases: Secondary | ICD-10-CM | POA: Diagnosis not present

## 2020-10-16 DIAGNOSIS — I10 Essential (primary) hypertension: Secondary | ICD-10-CM | POA: Diagnosis present

## 2020-10-16 DIAGNOSIS — D649 Anemia, unspecified: Secondary | ICD-10-CM | POA: Diagnosis present

## 2020-10-16 DIAGNOSIS — G4700 Insomnia, unspecified: Secondary | ICD-10-CM | POA: Diagnosis present

## 2020-10-16 DIAGNOSIS — R180 Malignant ascites: Secondary | ICD-10-CM | POA: Diagnosis present

## 2020-10-16 DIAGNOSIS — Z515 Encounter for palliative care: Secondary | ICD-10-CM | POA: Diagnosis not present

## 2020-10-16 DIAGNOSIS — C786 Secondary malignant neoplasm of retroperitoneum and peritoneum: Secondary | ICD-10-CM

## 2020-10-16 DIAGNOSIS — N138 Other obstructive and reflux uropathy: Secondary | ICD-10-CM | POA: Diagnosis not present

## 2020-10-16 DIAGNOSIS — C569 Malignant neoplasm of unspecified ovary: Secondary | ICD-10-CM

## 2020-10-16 DIAGNOSIS — N179 Acute kidney failure, unspecified: Secondary | ICD-10-CM

## 2020-10-16 DIAGNOSIS — Z79899 Other long term (current) drug therapy: Secondary | ICD-10-CM | POA: Diagnosis not present

## 2020-10-16 DIAGNOSIS — B965 Pseudomonas (aeruginosa) (mallei) (pseudomallei) as the cause of diseases classified elsewhere: Secondary | ICD-10-CM | POA: Diagnosis not present

## 2020-10-16 DIAGNOSIS — Z8249 Family history of ischemic heart disease and other diseases of the circulatory system: Secondary | ICD-10-CM | POA: Diagnosis not present

## 2020-10-16 DIAGNOSIS — E785 Hyperlipidemia, unspecified: Secondary | ICD-10-CM | POA: Diagnosis present

## 2020-10-16 DIAGNOSIS — R34 Anuria and oliguria: Secondary | ICD-10-CM | POA: Diagnosis present

## 2020-10-16 DIAGNOSIS — Z6841 Body Mass Index (BMI) 40.0 and over, adult: Secondary | ICD-10-CM | POA: Diagnosis not present

## 2020-10-16 DIAGNOSIS — K429 Umbilical hernia without obstruction or gangrene: Secondary | ICD-10-CM | POA: Diagnosis present

## 2020-10-16 DIAGNOSIS — N63 Unspecified lump in unspecified breast: Secondary | ICD-10-CM | POA: Diagnosis present

## 2020-10-16 DIAGNOSIS — Z20822 Contact with and (suspected) exposure to covid-19: Secondary | ICD-10-CM | POA: Diagnosis present

## 2020-10-16 DIAGNOSIS — N390 Urinary tract infection, site not specified: Secondary | ICD-10-CM | POA: Diagnosis not present

## 2020-10-16 DIAGNOSIS — A4152 Sepsis due to Pseudomonas: Secondary | ICD-10-CM | POA: Diagnosis present

## 2020-10-16 DIAGNOSIS — Z66 Do not resuscitate: Secondary | ICD-10-CM | POA: Diagnosis present

## 2020-10-16 DIAGNOSIS — Z8543 Personal history of malignant neoplasm of ovary: Secondary | ICD-10-CM | POA: Diagnosis not present

## 2020-10-16 LAB — CBC
HCT: 34.8 % — ABNORMAL LOW (ref 36.0–46.0)
Hemoglobin: 10.9 g/dL — ABNORMAL LOW (ref 12.0–15.0)
MCH: 26.4 pg (ref 26.0–34.0)
MCHC: 31.3 g/dL (ref 30.0–36.0)
MCV: 84.3 fL (ref 80.0–100.0)
Platelets: 375 10*3/uL (ref 150–400)
RBC: 4.13 MIL/uL (ref 3.87–5.11)
RDW: 13.2 % (ref 11.5–15.5)
WBC: 9 10*3/uL (ref 4.0–10.5)
nRBC: 0 % (ref 0.0–0.2)

## 2020-10-16 LAB — BASIC METABOLIC PANEL
Anion gap: 13 (ref 5–15)
BUN: 36 mg/dL — ABNORMAL HIGH (ref 8–23)
CO2: 19 mmol/L — ABNORMAL LOW (ref 22–32)
Calcium: 8.6 mg/dL — ABNORMAL LOW (ref 8.9–10.3)
Chloride: 103 mmol/L (ref 98–111)
Creatinine, Ser: 6.82 mg/dL — ABNORMAL HIGH (ref 0.44–1.00)
GFR, Estimated: 6 mL/min — ABNORMAL LOW (ref >60)
Glucose, Bld: 98 mg/dL (ref 70–99)
Potassium: 4.7 mmol/L (ref 3.5–5.1)
Sodium: 135 mmol/L (ref 135–145)

## 2020-10-16 MED ORDER — CHLORHEXIDINE GLUCONATE CLOTH 2 % EX PADS: 6.0000 | MEDICATED_PAD | Freq: Every day | CUTANEOUS | Status: DC

## 2020-10-16 MED ORDER — PSYLLIUM 95 % PO PACK
1.0000 | PACK | Freq: Every day | ORAL | Status: DC
  Administered 2020-10-17 – 2020-10-19 (×3): 1 via ORAL
  Filled 2020-10-16 (×5): qty 1

## 2020-10-16 MED ORDER — CHLORHEXIDINE GLUCONATE CLOTH 2 % EX PADS
6.0000 | MEDICATED_PAD | Freq: Every day | CUTANEOUS | Status: DC
  Administered 2020-10-17 – 2020-10-20 (×4): 6 via TOPICAL

## 2020-10-16 MED ORDER — SODIUM CHLORIDE 0.9 % IV SOLN: INTRAVENOUS | Status: AC

## 2020-10-16 NOTE — Consult Note (Signed)
Urology Consult  I have been asked to see the patient by Dr. Janese Banks, for evaluation and management of acute renal failure, bilateral hydronephrosis.  Chief Complaint: Abnormal labs  History of Present Illness: Tracy Goodman is a 65 y.o. year old female who was admitted yesterday with worsening renal failure.  She was admitted last week with acute renal failure and underwent bilateral ureteral stent placement along with bladder biopsy.  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  Her renal function improved prior to discharge down to 2.27 from a peak of 5.32 prior to stent placement.  Since her discharge over the weekend, she has been drinking copious amounts of fluids.  Despite drinking fluids, she noted that her urine output has dropped off.  She was seen yesterday in the oncology clinic at which time she was noted to have a creatinine of 7.87 and was readmitted to the hospitalist service directly.  Overnight, she had a Foley catheter in place and had fairly good urine output.  Today, her creatinine is improved slightly to 6.82.  Past Medical History:  Diagnosis Date  . Diverticulitis   . Hypertension   . 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 CESAREAN SECTION    . CYSTOSCOPY W/ URETERAL STENT PLACEMENT Bilateral 10/10/2020   Procedure: CYSTOSCOPY WITH RETROGRADE PYELOGRAM/URETERAL STENT PLACEMENT;  Surgeon: Hollice Espy, MD;  Location: [facility] ORS;  Service: Urology;  Laterality: Bilateral;  . FULGURATION OF BLADDER TUMOR N/A 10/10/2020   Procedure: FULGURATION OF BLADDER TUMOR;  Surgeon: Hollice Espy, MD;  Location: [facility] ORS;  Service: Urology;  Laterality: N/A;  . WRIST FRACTURE SURGERY Left     Home Medications:  Current Meds  Medication Sig  . acetaminophen (TYLENOL) 325 MG tablet Take 2 tablets (650 mg total) by mouth every 6 (six) hours as needed for mild pain, fever or  headache (or Fever >/= 101).  Marland Kitchen amLODipine (NORVASC) 10 MG tablet Take 1 tablet (10 mg total) by mouth daily.    Allergies: No Known Allergies  Family History  Problem Relation Age of Onset  . COPD Mother   . Heart failure Mother     Social History:  reports that she has never smoked. She has never used smokeless tobacco. She reports previous alcohol use. She reports that she does not use drugs.  ROS: A complete review of systems was performed.  All systems are negative except for pertinent findings as noted.  Physical Exam:  Vital signs in last 24 hours: Temp:  [98.2 F (36.8 C)-99 F (37.2 C)] 98.4 F (36.9 C) (04/27 0742) Pulse Rate:  [103-122] 122 (04/27 0742) Resp:  [16-22] 20 (04/27 0442) BP: (125-193)/(60-120) 143/89 (04/27 0742) SpO2:  [95 %-99 %] 98 % (04/27 0742) Weight:  [108.9 kg] 108.9 kg (04/26 1156) Constitutional:  Alert and oriented, No acute distress, husband at bedside HEENT: [place] AT, moist mucus membranes.  Trachea midline, no masses Cardiovascular: Regular rate and rhythm, no clubbing, cyanosis, or edema. Respiratory: Normal respiratory effort, lungs clear bilaterallys Skin: No rashes, bruises or suspicious lesions Neurologic: Grossly intact, no focal deficits, moving all 4 extremities Psychiatric: Normal mood and affect   Laboratory Data:  Recent Labs    10/15/20 1249 10/16/20 0603  WBC 8.2 9.0  HGB 11.4* 10.9*  HCT 36.6 34.8*   Recent Labs    10/15/20 1029 10/15/20 1514 10/16/20 0603  NA 133* 129* 135  K  4.4 4.4 4.7  CL 101 99 103  CO2 17* 18* 19*  GLUCOSE 107* 101* 98  BUN 35* 36* 36*  CREATININE 7.87* 7.90* 6.82*  CALCIUM 8.7* 8.7* 8.6*    Radiologic Imaging: DG Chest Port 1 View  Result Date: 10/15/2020 CLINICAL DATA:  AK I EXAM: PORTABLE CHEST 1 VIEW COMPARISON:  Same day CT abdomen pelvis and chest radiograph September 04, 2020. FINDINGS: The heart size and mediastinal contours are within normal limits. Low lung volumes with  bibasilar linear atelectasis. Small left pleural effusion with adjacent consolidative opacity. No visible pneumothorax. The visualized skeletal structures are unchanged. IMPRESSION: Small left pleural effusion with adjacent left lower lobe consolidative opacity, which may reflect atelectasis and/or infection. Electronically Signed   By: Dahlia Bailiff MD   On: 10/15/2020 16:03   CT Renal Stone Study  Result Date: 10/15/2020 CLINICAL DATA:  Renal insufficiency. EXAM: CT ABDOMEN AND PELVIS WITHOUT CONTRAST TECHNIQUE: Multidetector CT imaging of the abdomen and pelvis was performed following the standard protocol without IV contrast. COMPARISON:  09/30/2020. FINDINGS: Lower chest: Basilar atelectasis again noted bilaterally with interval increase in posterior left lower lobe collapse/consolidative opacity. Small left pleural effusion has progressed in the interval. Hepatobiliary: No focal abnormality in the liver on this study without intravenous contrast. Layering sludge noted in the distended gallbladder. No intrahepatic or extrahepatic biliary dilation. Pancreas: No focal mass lesion. No dilatation of the main duct. No intraparenchymal cyst. No peripancreatic edema. Spleen: No splenomegaly. No focal mass lesion. Adrenals/Urinary Tract: No adrenal nodule or mass. Mild hydronephrosis in both kidneys is similar to prior with bilateral internal ureteral stents visualized. No associated hydroureter. The bladder is decompressed. Stomach/Bowel: Tiny hiatal hernia. Stomach otherwise unremarkable. Duodenum is normally positioned as is the ligament of Treitz. No small bowel wall thickening. No small bowel dilatation. The terminal ileum is normal. The appendix is not well visualized, but there is no edema or inflammation in the region of the cecum. No gross colonic mass. No colonic wall thickening. Diverticular changes are noted in the left colon without evidence of diverticulitis. Vascular/Lymphatic: No abdominal aortic  aneurysm. No change in the retroperitoneal abdominopelvic lymphadenopathy, better characterized on the recent CT scan performed with intravenous contrast material. Reproductive: The uterus is unremarkable. Similar prominence of the cervix. There is no adnexal mass. Other: Interval increase in small to moderate volume abdominopelvic ascites with edema and nodularity in the omentum inferiorly, as before. Musculoskeletal: Small bilateral groin hernias contain only fat. No worrisome lytic or sclerotic osseous abnormality. IMPRESSION: 1. Mild bilateral hydronephrosis is similar to prior, now with bilateral internal ureteral stents in situ. No evidence for hydroureter in the bladder is decompressed. 2. Similar appearance of upper normal to mildly enlarged retroperitoneal and pelvic sidewall lymphadenopathy. This was better evaluated on recent CT with IV contrast. 3. Interval progression of ascites, now small to moderate volume. There is associated edema/caking in the omentum concerning for metastatic disease. 4. Small bilateral groin hernias contain only fat. 5. Left colonic diverticulosis without diverticulitis. Electronically Signed   By: Misty Stanley M.D.   On: 10/15/2020 13:06   CT scan was personally reviewed.  Agree with radiologic interpretation.  Persistent mild bilateral hydronephrosis.  Impression/ Plan:  1.  Acute renal insufficiency/urinary obstruction- suspect there is some degree of obstructive component to her renal failure now status post bilateral ureteral stents which appear to be in good position.  Creatinine has improved overnight with maximal urinary decompression with Foley which is reassuring.  Overall, the  degree of hydronephrosis is not severe which speaks against high-grade ureteral obstruction/stent failure.   -We will continue maximal urinary decompression for the time being with Foley and continue to monitor renal function.   -If urine function fails to normalize or stagnates,  consider bilateral percutaneous nephrostomy tube placement versus repeat imaging.   -She is agreeable this plan.  We will make her n.p.o. at midnight again and reassess in the a.m.  2.  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 Janese Banks.  Bladder biopsy consistent with GYN primary.  10/16/2020, 8:44 AM  Hollice Espy,  MD

## 2020-10-16 NOTE — Progress Notes (Addendum)
PROGRESS NOTE    Tracy Goodman  TIW:[id number] DOB: [birth year] DOA: 10/15/2020 PCP: [name], MD    Chief Complaint  Patient presents with  . Abnormal Lab    Brief Narrative:  Tracy Goodman is a 65 y.o. 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, presents to the emergency department at the request of cancer center for abnormal labs, aki, likely obstructive nephropathy   Subjective:  She denies pain, foley in place  Assessment & Plan:   Principal Problem:   AKI (acute kidney injury) ([place]) Active Problems:   Hyperlipidemia   Hypertension   Morbid obesity ([place])   Enlarged thyroid gland   Umbilical hernia   Breast lump on right side at 10 o'clock position   Bilateral hydronephrosis   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[place])   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[place])   Palliative care encounter  AKI/metabolic acidosis -Baseline creatinine was normal in March 2022 -Suspect AKI due to post obstructive nephropathy -Continue sodium bicarb placement, continue hydration -Repeat BMP in the morning -Urology and nephrology following, currently have a Foley catheter, possible bilateral nephrostomy tube placement tomorrow  Normocytic anemia No overt sign of bleeding Hemoglobin 10.9 Monitor  HTN With sinus tachycardia, I have discussed with her about starting Lopressor ,patient wanted to discontinue Norvasc for now, will discuss Lopressor tomorrow  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, bladder involvement as well as malignant ascites and omental carcinomatosis  -Management per oncology and palliative care  Right-sided breast mass at the 10 o'clock position-outpatient follow-up  Insomnia- trazodone 25 mg nightly  The patient's BMI is: Body mass index is 48.47 kg/m.Marland Kitchen      Unresulted Labs (From admission, onward)          Start     Ordered   10/17/20 0500  CBC with Differential/Platelet  Tomorrow morning,   R        10/16/20 1727   10/17/20 2505  Basic metabolic panel  Daily,   R      10/16/20 1727            DVT prophylaxis: Place TED hose Start: 10/15/20 1508   Code Status: Full Family Communication: Patient Disposition:   Status is: Inpatient  Dispo: The patient is from: Home              Anticipated d/c is to: Home              Anticipated d/c date is: To be determined, need renal function improvement                Consultants:   Urology  Nephrology  Hematology/oncology  Palliative care  Procedures:   Foley placement   Antimicrobials:   None     Objective: Vitals:   10/16/20 0742 10/16/20 0957 10/16/20 1115 10/16/20 1551  BP: (!) 143/89 (!) 147/82 129/69 (!) 146/79  Pulse: (!) 122 (!) 120 (!) 109 (!) 116  Resp:    (!) 24  Temp: 98.4 F (36.9 C) 97.9 F (36.6 C) 97.7 F (36.5 C) 97.8 F (36.6 C)  TempSrc:   Oral Oral  SpO2: 98% 95% 100% 99%  Weight:      Height:        Intake/Output Summary (Last 24 hours) at 10/16/2020 1727 Last data filed at 10/16/2020 1424 Gross per 24 hour  Intake 120 ml  Output 1080 ml  Net -960 ml   Filed Weights   10/15/20 1156  Weight: 108.9 kg    Examination:  General exam: calm, NAD Respiratory system: Clear to auscultation. Respiratory effort normal. Cardiovascular system: S1 & S2 heard, tachycardia, No pedal edema. Gastrointestinal system: Abdomen is mildly distended, soft and nontender.  Normal bowel sounds heard. Central nervous system: Alert and oriented. No focal neurological deficits. Extremities: Symmetric 5 x 5 power. Skin: No rashes, lesions or ulcers Psychiatry: Judgement and insight appear normal. Mood & affect appropriate.     Data Reviewed: I have personally reviewed following labs and imaging studies  CBC: Recent Labs  Lab 10/09/20 1822  10/10/20 0322 10/11/20 0538 10/15/20 1249 10/16/20 0603  WBC 6.0 6.9 8.6 8.2 9.0  NEUTROABS 4.1  --   --  6.2  --   HGB 11.6* 10.9* 11.3* 11.4* 10.9*  HCT 38.4 35.3* 37.3 36.6 34.8*  MCV 86.7 85.9 87.4 85.7 84.3  PLT 437* 413* 421* 356 614    Basic Metabolic Panel: Recent Labs  Lab 10/10/20 0322 10/11/20 0538 10/15/20 1029 10/15/20 1514 10/15/20 2135 10/16/20 0603  NA 137 140 133* 129*  --  135  K 4.0 4.5 4.4 4.4  --  4.7  CL 105 109 101 99  --  103  CO2 22 21* 17* 18*  --  19*  GLUCOSE 94 100* 107* 101*  --  98  BUN 23 17 35* 36*  --  36*  CREATININE 3.41* 2.27* 7.87* 7.90*  --  6.82*  CALCIUM 8.6* 8.9 8.7* 8.7*  --  8.6*  MG  --  2.3  --   --  2.3  --     GFR: Estimated Creatinine Clearance: 9.3 mL/min (A) (by C-G formula based on SCr of 6.82 mg/dL (H)).  Liver Function Tests: Recent Labs  Lab 10/15/20 1514  AST 18  ALT 10  ALKPHOS 111  BILITOT 0.8  PROT 7.1  ALBUMIN 2.9*    CBG: No results for input(s): GLUCAP in the last 168 hours.   Recent Results (from the past 240 hour(s))  SARS CORONAVIRUS 2 (TAT 6-24 HRS) Nasopharyngeal Nasopharyngeal Swab     Status: None   Collection Time: 10/09/20  7:55 PM   Specimen: Nasopharyngeal Swab  Result Value Ref Range Status   SARS Coronavirus 2 NEGATIVE NEGATIVE Final    Comment: (NOTE) SARS-CoV-2 target nucleic acids are NOT DETECTED.  The SARS-CoV-2 RNA is generally detectable in upper and lower respiratory specimens during the acute phase of infection. Negative results do not preclude SARS-CoV-2 infection, do not rule out co-infections with other pathogens, and should not be used as the sole basis for treatment or other patient management decisions. Negative results must be combined with clinical observations, patient history, and epidemiological information. The expected result is Negative.  Fact Sheet for Patients: SugarRoll.be  Fact Sheet for Healthcare  Providers: https://www.woods-mathews.com/  This test is not yet approved or cleared by the [place] FDA and  has been authorized for detection and/or diagnosis of SARS-CoV-2 by FDA under an Emergency Use Authorization (EUA). This EUA will remain  in effect (meaning this test can be used) for the duration of the COVID-19 declaration under Se ction 564(b)(1) of the Act, 21 U.S.C. section 360bbb-3(b)(1), unless the authorization is terminated or revoked sooner.  Performed at [facility], [address], [place], [place] [address]   Resp Panel by RT-PCR (Flu A&B, Covid) Nasopharyngeal Swab     Status: None   Collection  Time: 10/15/20 12:20 PM   Specimen: Nasopharyngeal Swab; Nasopharyngeal(NP) swabs in vial transport medium  Result Value Ref Range Status   SARS Coronavirus 2 by RT PCR NEGATIVE NEGATIVE Final    Comment: (NOTE) SARS-CoV-2 target nucleic acids are NOT DETECTED.  The SARS-CoV-2 RNA is generally detectable in upper respiratory specimens during the acute phase of infection. The lowest concentration of SARS-CoV-2 viral copies this assay can detect is 138 copies/mL. A negative result does not preclude SARS-Cov-2 infection and should not be used as the sole basis for treatment or other patient management decisions. A negative result may occur with  improper specimen collection/handling, submission of specimen other than nasopharyngeal swab, presence of viral mutation(s) within the areas targeted by this assay, and inadequate number of viral copies(<138 copies/mL). A negative result must be combined with clinical observations, patient history, and epidemiological information. The expected result is Negative.  Fact Sheet for Patients:  EntrepreneurPulse.com.au  Fact Sheet for Healthcare Providers:  IncredibleEmployment.be  This test is no t yet approved or cleared by the [place] FDA and  has been authorized  for detection and/or diagnosis of SARS-CoV-2 by FDA under an Emergency Use Authorization (EUA). This EUA will remain  in effect (meaning this test can be used) for the duration of the COVID-19 declaration under Section 564(b)(1) of the Act, 21 U.S.C.section 360bbb-3(b)(1), unless the authorization is terminated  or revoked sooner.       Influenza A by PCR NEGATIVE NEGATIVE Final   Influenza B by PCR NEGATIVE NEGATIVE Final    Comment: (NOTE) The Xpert Xpress SARS-CoV-2/FLU/RSV plus assay is intended as an aid in the diagnosis of influenza from Nasopharyngeal swab specimens and should not be used as a sole basis for treatment. Nasal washings and aspirates are unacceptable for Xpert Xpress SARS-CoV-2/FLU/RSV testing.  Fact Sheet for Patients: EntrepreneurPulse.com.au  Fact Sheet for Healthcare Providers: IncredibleEmployment.be  This test is not yet approved or cleared by the [place] FDA and has been authorized for detection and/or diagnosis of SARS-CoV-2 by FDA under an Emergency Use Authorization (EUA). This EUA will remain in effect (meaning this test can be used) for the duration of the COVID-19 declaration under Section 564(b)(1) of the Act, 21 U.S.C. section 360bbb-3(b)(1), unless the authorization is terminated or revoked.  Performed at [facility], [address] [place],  [address]          Radiology Studies: DG Chest Clever 1 View  Result Date: 10/15/2020 CLINICAL DATA:  AK I EXAM: PORTABLE CHEST 1 VIEW COMPARISON:  Same day CT abdomen pelvis and chest radiograph September 04, 2020. FINDINGS: The heart size and mediastinal contours are within normal limits. Low lung volumes with bibasilar linear atelectasis. Small left pleural effusion with adjacent consolidative opacity. No visible pneumothorax. The visualized skeletal structures are unchanged. IMPRESSION: Small left pleural effusion with adjacent left lower  lobe consolidative opacity, which may reflect atelectasis and/or infection. Electronically Signed   By: Dahlia Bailiff MD   On: 10/15/2020 16:03   CT Renal Stone Study  Result Date: 10/15/2020 CLINICAL DATA:  Renal insufficiency. EXAM: CT ABDOMEN AND PELVIS WITHOUT CONTRAST TECHNIQUE: Multidetector CT imaging of the abdomen and pelvis was performed following the standard protocol without IV contrast. COMPARISON:  09/30/2020. FINDINGS: Lower chest: Basilar atelectasis again noted bilaterally with interval increase in posterior left lower lobe collapse/consolidative opacity. Small left pleural effusion has progressed in the interval. Hepatobiliary: No focal abnormality in the liver on this study without intravenous contrast. Layering sludge  noted in the distended gallbladder. No intrahepatic or extrahepatic biliary dilation. Pancreas: No focal mass lesion. No dilatation of the main duct. No intraparenchymal cyst. No peripancreatic edema. Spleen: No splenomegaly. No focal mass lesion. Adrenals/Urinary Tract: No adrenal nodule or mass. Mild hydronephrosis in both kidneys is similar to prior with bilateral internal ureteral stents visualized. No associated hydroureter. The bladder is decompressed. Stomach/Bowel: Tiny hiatal hernia. Stomach otherwise unremarkable. Duodenum is normally positioned as is the ligament of Treitz. No small bowel wall thickening. No small bowel dilatation. The terminal ileum is normal. The appendix is not well visualized, but there is no edema or inflammation in the region of the cecum. No gross colonic mass. No colonic wall thickening. Diverticular changes are noted in the left colon without evidence of diverticulitis. Vascular/Lymphatic: No abdominal aortic aneurysm. No change in the retroperitoneal abdominopelvic lymphadenopathy, better characterized on the recent CT scan performed with intravenous contrast material. Reproductive: The uterus is unremarkable. Similar prominence of the  cervix. There is no adnexal mass. Other: Interval increase in small to moderate volume abdominopelvic ascites with edema and nodularity in the omentum inferiorly, as before. Musculoskeletal: Small bilateral groin hernias contain only fat. No worrisome lytic or sclerotic osseous abnormality. IMPRESSION: 1. Mild bilateral hydronephrosis is similar to prior, now with bilateral internal ureteral stents in situ. No evidence for hydroureter in the bladder is decompressed. 2. Similar appearance of upper normal to mildly enlarged retroperitoneal and pelvic sidewall lymphadenopathy. This was better evaluated on recent CT with IV contrast. 3. Interval progression of ascites, now small to moderate volume. There is associated edema/caking in the omentum concerning for metastatic disease. 4. Small bilateral groin hernias contain only fat. 5. Left colonic diverticulosis without diverticulitis. Electronically Signed   By: Misty Stanley M.D.   On: 10/15/2020 13:06        Scheduled Meds: . amLODipine  10 mg Oral Daily  . multivitamin with minerals  1 tablet Oral Daily  . [START ON 10/17/2020] psyllium  1 packet Oral Daily  . sodium bicarbonate  1,300 mg Oral TID  . traZODone  25 mg Oral QHS   Continuous Infusions: . sodium chloride 100 mL/hr at 10/16/20 1053     LOS: 0 days   Time spent: 30mins Greater than 50% of this time was spent in counseling, explanation of diagnosis, planning of further management, and coordination of care.   Voice Recognition Viviann Spare dictation system was used to create this note, attempts have been made to correct errors. Please contact the author with questions and/or clarifications.   Florencia Reasons, MD PhD FACP [facility]  Available via Epic secure chat 7am-7pm for nonurgent issues Please page for urgent issues To page the attending provider between 7A-7P or the covering provider during after hours 7P-7A, please log into the web site [email or web address] and access using universal  [place] password for that web site. If you do not have the password, please call the hospital operator.    10/16/2020, 5:27 PM

## 2020-10-16 NOTE — Consult Note (Signed)
[place]  [phone] [phone]   Name: Tracy Goodman Date: 10/16/2020 MRN: [id number]  DOB: 1955-11-29  Patient Care Team: [name], MD as PCP - General (Family Medicine) Clent Jacks, RN as Oncology Nurse Navigator    REASON FOR CONSULTATION: Tracy Goodman is a 65 y.o. female with multiple medical problems including hypertension, hyperlipidemia, and newly diagnosed suspected cervical cancer, who was admitted on 10/15/2020 with AKI of unclear etiology after failing previous urological stenting.  Palliative care was consulted help address goals.  SOCIAL HISTORY:     reports that she has never smoked. She has never used smokeless tobacco. She reports previous alcohol use. She reports that she does not use drugs.  Patient is married and lives at home with her husband and stepson.  Patient has 2 daughters and a son.  She has 6 stepchildren.  Patient previously worked as a Building control surveyor.  ADVANCE DIRECTIVES:  None on file  CODE STATUS: Full code  PAST MEDICAL HISTORY: Past Medical History:  Diagnosis Date  . Diverticulitis   . Hypertension   . 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 CESAREAN SECTION    . CYSTOSCOPY W/ URETERAL STENT PLACEMENT Bilateral 10/10/2020   Procedure: CYSTOSCOPY WITH RETROGRADE PYELOGRAM/URETERAL STENT PLACEMENT;  Surgeon: Hollice Espy, MD;  Location: [facility] ORS;  Service: Urology;  Laterality: Bilateral;  . FULGURATION OF BLADDER TUMOR N/A 10/10/2020   Procedure: FULGURATION OF BLADDER TUMOR;  Surgeon: Hollice Espy, MD;  Location: [facility] OR[facility];  Service: Urology;  Laterality: N/A;  . WRIST FRACTURE SURGERY Left     HEMATOLOGY/ONCOLOGY HISTORY:  Oncology History   No history exists.    ALLERGIES:  has No Known Allergies.  MEDICATIONS:  Current Facility-Administered  Medications  Medication Dose Route Frequency Provider Last Rate Last Admin  . 0.9 %  sodium chloride infusion   Intravenous Continuous Murlean Iba, MD 100 mL/hr at 10/16/20 1053 New Bag at 10/16/20 1053  . acetaminophen (TYLENOL) tablet 650 mg  650 mg Oral Q6H PRN Cox, Amy N, DO       Or  . acetaminophen (TYLENOL) suppository 650 mg  650 mg Rectal Q6H PRN Cox, Amy N, DO      . amLODipine (NORVASC) tablet 10 mg  10 mg Oral Daily Murlean Iba, MD   10 mg at 10/16/20 0840  . labetalol (NORMODYNE) injection 10 mg  10 mg Intravenous Q4H PRN Sharion Settler, NP      . multivitamin with minerals tablet 1 tablet  1 tablet Oral Daily Cox, Amy N, DO   1 tablet at 10/16/20 0840  . ondansetron (ZOFRAN) tablet 4 mg  4 mg Oral Q6H PRN Cox, Amy N, DO       Or  . ondansetron (ZOFRAN) injection 4 mg  4 mg Intravenous Q6H PRN Cox, Amy N, DO      . [START ON 10/17/2020] psyllium (HYDROCIL/METAMUCIL) 1 packet  1 packet Oral Daily Florencia Reasons, MD      . sodium bicarbonate tablet 1,300 mg  1,300 mg Oral TID Sharion Settler, NP   1,300 mg at 10/16/20 0840  . traZODone (DESYREL) tablet 25 mg  25 mg Oral QHS Sharion Settler, NP   25 mg at 10/15/20 2224    VITAL SIGNS: BP 129/69 (BP Location: Right Arm)   Pulse (!) 109   Temp 97.7  F (36.5 C) (Oral)   Resp 20   Ht _0  (1.499 m)   Wt 240 lb (108.9 kg)   SpO2 100%   BMI 48.47 kg/m  Filed Weights   10/15/20 1156  Weight: 240 lb (108.9 kg)    Estimated body mass index is 48.47 kg/m as calculated from the following:   Height as of this encounter: _1  (1.499 m).   Weight as of this encounter: 240 lb (108.9 kg).  LABS: CBC:    Component Value Date/Time   WBC 9.0 10/16/2020 0603   HGB 10.9 (L) 10/16/2020 0603   HCT 34.8 (L) 10/16/2020 0603   PLT 375 10/16/2020 0603   MCV 84.3 10/16/2020 0603   NEUTROABS 6.2 10/15/2020 1249   LYMPHSABS 1.1 10/15/2020 1249   MONOABS 0.8 10/15/2020 1249   EOSABS 0.1 10/15/2020 1249   BASOSABS 0.0 10/15/2020  1249   Comprehensive Metabolic Panel:    Component Value Date/Time   NA 135 10/16/2020 0603   K 4.7 10/16/2020 0603   CL 103 10/16/2020 0603   CO2 19 (L) 10/16/2020 0603   BUN 36 (H) 10/16/2020 0603   CREATININE 6.82 (H) 10/16/2020 0603   GLUCOSE 98 10/16/2020 0603   CALCIUM 8.6 (L) 10/16/2020 0603   AST 18 10/15/2020 1514   ALT 10 10/15/2020 1514   ALKPHOS 111 10/15/2020 1514   BILITOT 0.8 10/15/2020 1514   PROT 7.1 10/15/2020 1514   ALBUMIN 2.9 (L) 10/15/2020 1514    RADIOGRAPHIC STUDIES: CT ABDOMEN PELVIS WO CONTRAST  Result Date: 09/30/2020 CLINICAL DATA:  65 year old female with abdominal pain greater on the left. Recent diverticulitis status post full course of antibiotics. Two episodes of bright red blood in stool yesterday. EXAM: CT ABDOMEN AND PELVIS WITHOUT CONTRAST TECHNIQUE: Multidetector CT imaging of the abdomen and pelvis was performed following the standard protocol without IV contrast. COMPARISON:  CT Abdomen and Pelvis 09/04/2020. FINDINGS: Lower chest: Small layering left pleural effusion is new since last month. No cardiomegaly or pericardial effusion. Mild lung base atelectasis. Hepatobiliary: Trace perihepatic free fluid is new since last month. Otherwise noncontrast liver and gallbladder appear stable. Pancreas: Negative. Spleen: Small volume perisplenic free fluid is new since last month. Simple fluid density. Otherwise stable noncontrast spleen. Adrenals/Urinary Tract: Normal adrenal glands. New bilateral hydronephrosis (series 2, image 32). No nephrolithiasis. Both ureters are difficult to delineate at the pelvic inlet where to generalized increased inflammation is noted from last month. Urinary bladder is diminutive. Stomach/Bowel: New free fluid layering in both gutters greater on the left. Simple fluid density on that side (coronal image 90). Generalized mesenteric stranding in the distal omentum (coronal image 74 and series 2, image 64) superficial to the uterus  and adnexa which are abnormal, described below. No dilated small or large bowel loops. Diverticulosis of the large bowel. Sigmoid colon is indistinct and inseparable from the uterine fundus on sagittal image 116. No pneumoperitoneum is identified. Negative terminal ileum. Appendix cannot be delineated but likely courses toward the uterine fundus. Decompressed stomach and duodenum. Vascular/Lymphatic: Aortoiliac calcified atherosclerosis. Normal caliber abdominal aorta. Vascular patency is not evaluated in the absence of IV contrast. Reproductive: Moderate generalized parametrial inflammation has increased (series 2, image 63). Continued gas in the vagina. No gas identified within the endometrium. Other: Increased presacral stranding since last month. No layering pelvic free fluid. Small fat containing inguinal hernias are stable. Musculoskeletal: Lumbar facet arthropathy. No acute osseous abnormality identified. IMPRESSION: 1. Small volume new free fluid in the abdomen  and pelvis. No free air identified. Increased and generalized inflammation at the pelvic inlet, in the distal omentum, and about the uterus and ovaries. Sigmoid colon appears indistinct and inseparable from the uterine fundus, with persistent gas within the vagina. Appendix not delineated. New bilateral hydronephrosis. 2. Recommend repeat CT Abdomen and Pelvis with oral and IV contrast to further characterize (and recommend sufficient oral contrast and timing delay to ensure contrast has reached the sigmoid colon). 3. Small new layering left pleural effusion. Electronically Signed   By: Genevie Ann M.D.   On: 09/30/2020 06:28   CT ABDOMEN PELVIS W CONTRAST  Result Date: 09/30/2020 CLINICAL DATA:  65 year old female with history of left lower abdominal pain. Suspected abdominal abscess or infection. EXAM: CT ABDOMEN AND PELVIS WITH CONTRAST TECHNIQUE: Multidetector CT imaging of the abdomen and pelvis was performed using the standard protocol following  bolus administration of intravenous contrast. CONTRAST:  91m OMNIPAQUE IOHEXOL 300 MG/ML  SOLN COMPARISON:  CT of the abdomen and pelvis 10/01/2018 to. FINDINGS: Lower chest: Small left pleural effusion lying dependently with some associated passive subsegmental atelectasis in the left lower lobe. Scarring or subsegmental atelectasis in the right middle lobe also noted. Hepatobiliary: No suspicious cystic or solid hepatic lesions. No intra or extrahepatic biliary ductal dilatation. Gallbladder is normal in appearance. Pancreas: Fatty infiltration in the pancreas. No pancreatic mass. No pancreatic ductal dilatation. No pancreatic or peripancreatic fluid collections or inflammatory changes. Spleen: Unremarkable. Adrenals/Urinary Tract: Bilateral kidneys and adrenal glands are normal in appearance. No hydroureteronephrosis. Urinary bladder is nearly completely collapsed, but otherwise unremarkable in appearance. Stomach/Bowel: Stomach is completely decompressed, but otherwise unremarkable in appearance. No pathologic dilatation of small bowel or colon. Numerous colonic diverticulae are noted, without surrounding inflammatory changes to clearly indicate an acute diverticulitis at this time. Appendix is not confidently identified may be surgically absent. Vascular/Lymphatic: Atherosclerosis in the pelvic vasculature. No aneurysm or dissection noted in the abdominal or pelvic vasculature. Multiple prominent borderline enlarged and mildly enlarged retroperitoneal and pelvic lymph nodes. Specific examples include a left para-aortic lymph node measuring 1 cm in short axis adjacent to the left renal hilum (axial image 33 of series 2), 1.3 cm in short axis in the left para-aortic nodal station inferior to the left renal hilum (axial image 40 of series 2), 1 cm in short axis in the right external iliac nodal station (axial images 62 and 64 of series 2) and 1.3 cm in short axis in the right common femoral nodal station (axial  image 75 of series 2). Borderline enlarged mesorectal lymph node on the right (axial image 75 of series 2) measuring 9 mm in short axis. Reproductive: Mass-like enlargement of the cervix measuring 3.8 x 3.0 x 2.9 cm. Remainder of the uterus and ovaries are otherwise unremarkable in appearance. Other: Trace volume of ascites. Extensive omental nodularity, most notable in the low anatomic pelvis. Bilateral inguinal hernias containing predominantly fat, although some of the lower omental nodularity extends into the right inguinal hernia. Small umbilical hernia containing omental fat. Trace volume of ascites. No pneumoperitoneum. Musculoskeletal: There are no aggressive appearing lytic or blastic lesions noted in the visualized portions of the skeleton. IMPRESSION: 1. No intra-abdominal abscess. 2. 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 Notably, the cervix has a mass-like appearance. Further evaluation with pelvic examination is recommended to exclude gynecologic malignancy. 3. Small  left pleural effusion. 4. Colonic diverticulosis without evidence of acute diverticulitis at this time. 5. Additional incidental findings, as above. Electronically Signed   By: Vinnie Langton M.D.   On: 09/30/2020 11:55   US RENAL  Result Date: 10/09/2020 CLINICAL DATA:  Rising creatinine.  Advanced gyn malignancy EXAM: RENAL / URINARY TRACT ULTRASOUND COMPLETE COMPARISON:  CT abdomen pelvis 09/30/2020 FINDINGS: Right Kidney: Renal measurements: 10.8 x 5.2 x 5.6 cm = volume: 162 mL. Mild right hydronephrosis unchanged from prior CT. No renal mass. Left Kidney: Renal measurements: 11.1 x 5.2 x 5.2 cm = volume: 156 mL. Mild left hydronephrosis unchanged from recent CT. No left renal mass. Bladder: Urinary jets not seen during 10 minutes of  observation. Other: Trace ascites IMPRESSION: Mild bilateral renal obstruction likely due to distal ureteral obstruction. No change from recent CT. Electronically Signed   By: Franchot Gallo M.D.   On: 10/09/2020 16:17   DG Chest Port 1 View  Result Date: 10/15/2020 CLINICAL DATA:  AK I EXAM: PORTABLE CHEST 1 VIEW COMPARISON:  Same day CT abdomen pelvis and chest radiograph September 04, 2020. FINDINGS: The heart size and mediastinal contours are within normal limits. Low lung volumes with bibasilar linear atelectasis. Small left pleural effusion with adjacent consolidative opacity. No visible pneumothorax. The visualized skeletal structures are unchanged. IMPRESSION: Small left pleural effusion with adjacent left lower lobe consolidative opacity, which may reflect atelectasis and/or infection. Electronically Signed   By: Dahlia Bailiff MD   On: 10/15/2020 16:03   DG OR UROLOGY CYSTO IMAGE ([place])  Result Date: 10/10/2020 There is no interpretation for this exam.  This order is for images obtained during a surgical procedure.  Please See "Surgeries" Tab for more information regarding the procedure.   US BREAST LTD UNI RIGHT INC AXILLA  Result Date: 10/01/2020 CLINICAL DATA:  65 year old female presenting with a new palpable area of concern in the right breast. EXAM: DIGITAL DIAGNOSTIC UNILATERAL RIGHT MAMMOGRAM WITH TOMOSYNTHESIS AND CAD; ULTRASOUND RIGHT BREAST LIMITED TECHNIQUE: Right digital diagnostic mammography and breast tomosynthesis was performed. The images were evaluated with computer-aided detection.; Targeted ultrasound examination of the right breast was performed COMPARISON:  Previous exam(s). ACR Breast Density Category b: There are scattered areas of fibroglandular density. FINDINGS: Mammogram: A skin BB marks the site of concern reported by the patient in the upper outer right breast. A spot tangential view of this area is performed in addition to standard views. There is a round  circumscribed mass with central fatty hilum measuring approximately 1.2 cm at the palpable site, most likely an intramammary lymph node. There are no additional new findings elsewhere in the right breast. On physical exam at site of concern reported by the patient I feel a discrete mass. Ultrasound: Targeted ultrasound is performed at the palpable site of concern in the right breast at 9:30 o'clock 9 cm from the nipple demonstrating a round circumscribed hypoechoic mass with central fatty hilum measuring 1.3 x 1.3 x 1.1 cm, consistent with a intramammary lymph node. There is cortical thickening measuring 0.5 cm. This corresponds to the mammographic finding. Targeted ultrasound of the right axilla demonstrates normal lymph nodes. IMPRESSION: Abnormal intramammary lymph node in the right breast at 9:30 o'clock at the palpable site reported by the patient. RECOMMENDATION: Ultrasound-guided core needle biopsy of the right breast mass at 9:30 o'clock. I have discussed the findings and recommendations with the patient who agrees to proceed with biopsy. The patient will be contacted by our scheduler to  arrange the biopsy appointment. BI-RADS CATEGORY  4: Suspicious. Electronically Signed   By: Audie Pinto M.D.   On: 10/01/2020 11:23   MM DIAG BREAST TOMO UNI RIGHT  Result Date: 10/01/2020 CLINICAL DATA:  65 year old female presenting with a new palpable area of concern in the right breast. EXAM: DIGITAL DIAGNOSTIC UNILATERAL RIGHT MAMMOGRAM WITH TOMOSYNTHESIS AND CAD; ULTRASOUND RIGHT BREAST LIMITED TECHNIQUE: Right digital diagnostic mammography and breast tomosynthesis was performed. The images were evaluated with computer-aided detection.; Targeted ultrasound examination of the right breast was performed COMPARISON:  Previous exam(s). ACR Breast Density Category b: There are scattered areas of fibroglandular density. FINDINGS: Mammogram: A skin BB marks the site of concern reported by the patient in the upper  outer right breast. A spot tangential view of this area is performed in addition to standard views. There is a round circumscribed mass with central fatty hilum measuring approximately 1.2 cm at the palpable site, most likely an intramammary lymph node. There are no additional new findings elsewhere in the right breast. On physical exam at site of concern reported by the patient I feel a discrete mass. Ultrasound: Targeted ultrasound is performed at the palpable site of concern in the right breast at 9:30 o'clock 9 cm from the nipple demonstrating a round circumscribed hypoechoic mass with central fatty hilum measuring 1.3 x 1.3 x 1.1 cm, consistent with a intramammary lymph node. There is cortical thickening measuring 0.5 cm. This corresponds to the mammographic finding. Targeted ultrasound of the right axilla demonstrates normal lymph nodes. IMPRESSION: Abnormal intramammary lymph node in the right breast at 9:30 o'clock at the palpable site reported by the patient. RECOMMENDATION: Ultrasound-guided core needle biopsy of the right breast mass at 9:30 o'clock. I have discussed the findings and recommendations with the patient who agrees to proceed with biopsy. The patient will be contacted by our scheduler to arrange the biopsy appointment. BI-RADS CATEGORY  4: Suspicious. Electronically Signed   By: Audie Pinto M.D.   On: 10/01/2020 11:23   CT Renal Stone Study  Result Date: 10/15/2020 CLINICAL DATA:  Renal insufficiency. EXAM: CT ABDOMEN AND PELVIS WITHOUT CONTRAST TECHNIQUE: Multidetector CT imaging of the abdomen and pelvis was performed following the standard protocol without IV contrast. COMPARISON:  09/30/2020. FINDINGS: Lower chest: Basilar atelectasis again noted bilaterally with interval increase in posterior left lower lobe collapse/consolidative opacity. Small left pleural effusion has progressed in the interval. Hepatobiliary: No focal abnormality in the liver on this study without  intravenous contrast. Layering sludge noted in the distended gallbladder. No intrahepatic or extrahepatic biliary dilation. Pancreas: No focal mass lesion. No dilatation of the main duct. No intraparenchymal cyst. No peripancreatic edema. Spleen: No splenomegaly. No focal mass lesion. Adrenals/Urinary Tract: No adrenal nodule or mass. Mild hydronephrosis in both kidneys is similar to prior with bilateral internal ureteral stents visualized. No associated hydroureter. The bladder is decompressed. Stomach/Bowel: Tiny hiatal hernia. Stomach otherwise unremarkable. Duodenum is normally positioned as is the ligament of Treitz. No small bowel wall thickening. No small bowel dilatation. The terminal ileum is normal. The appendix is not well visualized, but there is no edema or inflammation in the region of the cecum. No gross colonic mass. No colonic wall thickening. Diverticular changes are noted in the left colon without evidence of diverticulitis. Vascular/Lymphatic: No abdominal aortic aneurysm. No change in the retroperitoneal abdominopelvic lymphadenopathy, better characterized on the recent CT scan performed with intravenous contrast material. Reproductive: The uterus is unremarkable. Similar prominence of the cervix. There is  no adnexal mass. Other: Interval increase in small to moderate volume abdominopelvic ascites with edema and nodularity in the omentum inferiorly, as before. Musculoskeletal: Small bilateral groin hernias contain only fat. No worrisome lytic or sclerotic osseous abnormality. IMPRESSION: 1. Mild bilateral hydronephrosis is similar to prior, now with bilateral internal ureteral stents in situ. No evidence for hydroureter in the bladder is decompressed. 2. Similar appearance of upper normal to mildly enlarged retroperitoneal and pelvic sidewall lymphadenopathy. This was better evaluated on recent CT with IV contrast. 3. Interval progression of ascites, now small to moderate volume. There is  associated edema/caking in the omentum concerning for metastatic disease. 4. Small bilateral groin hernias contain only fat. 5. Left colonic diverticulosis without diverticulitis. Electronically Signed   By: Misty Stanley M.D.   On: 10/15/2020 13:06    PERFORMANCE STATUS (ECOG) : 1 - Symptomatic but completely ambulatory  Review of Systems Unless otherwise noted, a complete review of systems is negative.  Physical Exam General: NAD Pulmonary: Unlabored Extremities: no edema, no joint deformities Skin: no rashes Neurological: Weakness but otherwise nonfocal  IMPRESSION: I met with patient and introduced palliative care services.  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  She understands that the current clinical priority is her renal function, which would have to improve prior to consideration for any cancer treatment.  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  Patient verbalized some reluctance to the idea of chemotherapy or any cancer treatment, which would make her quality of life poor.  She stated that her primary goal is to maintain quality of life for as long as possible even at the expense of quality of life.  She is interested in the current scope of treatment and would like to explore options for cancer care following discharge from the hospital.  Patient would want her husband to be involved in decision-making if needed.  She is interested in establishing ACP documents and I will consult the chaplain to assist with that.  We discussed CODE STATUS.  Patient is not interested in changing her [place] currently but says that she does not think that she would want to be resuscitated or have her life prolonged artificially on machines, especially if such care would likely to impact her quality of life.  PLAN: -Continue current scope of  treatment -Chaplain to assist with ACP -Will plan to follow patient in the clinic after discharge from the hospital  Case and plan discussed with Dr. Janese Banks   Time Total: 60 minutes  Visit consisted of counseling and education dealing with the complex and emotionally intense issues of symptom management and palliative care in the setting of serious and potentially life-threatening illness.Greater than 50%  of this time was spent counseling and coordinating care related to the above assessment and plan.  Signed by: Altha Harm, PhD, NP-C

## 2020-10-16 NOTE — Consult Note (Signed)
[facility] Consult Note:    Date of Admission:  10/15/2020           Reason for Consult: Acute kidney injury    Referring Provider: Florencia Reasons, MD Primary Care Provider: [name], MD   History of Presenting Illness:  Tracy Goodman is a 65 y.o. 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  Patient recently underwent bilateral ureteral stent placement for malignant compression of distal ureters. Baseline creatinine of 0.90 from September 04, 2020 During the previous admission, creatinine peaked at 3.52 and improved to 2.27 on April 22 Patient was found to have abnormal labs at the cancer center is now admitted for further evaluation Her creatinine has further increased to 7.87 at admission yesterday but today's creatinine is down to 6.82. Urology evaluation is ongoing Urinalysis shows large hemoglobin, proteinuria of 30, specific gravity 1.004, 6-10 RBCs, 21-50 WBCs.  But patient has bilateral ureteral stents.  Review of Systems: Gen: Denies any fevers or chills HEENT: No vision or hearing problems CV: No chest pain or shortness of breath Resp: No cough or sputum production GI: No nausea, vomiting or diarrhea.  No blood in the stool GU : No problems with voiding.  No hematuria.  No previous history of kidney problems MS: Ambulatory.  Denies any acute joint pain or swelling Derm:   No complaints Psych: No complaints Heme: No complaints Neuro: No complaints Endocrine: No complaints   Past Medical History:  Diagnosis Date  . Diverticulitis   . Hypertension   . 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 Smoking status: Never Smoker  . Smokeless tobacco: Never Used  Substance Use Topics  . Alcohol use: Not Currently  . Drug use: Never    Family History  Problem Relation Age of Onset  . COPD Mother   . Heart failure Mother       OBJECTIVE: Blood pressure (!) 143/89, pulse (!) 122, temperature 98.4 F (36.9 C), resp. rate 20, height 4\' 11"  (1.499 m), weight 108.9 kg, SpO2 98 %.  Physical Exam: General:  No acute distress, laying in the bed  HEENT  anicteric, moist oral mucous membrane  Pulm/lungs  normal breathing effort, lungs are clear to auscultation  CVS/Heart  regular rhythm, no rub or gallop  Abdomen:   Soft, nontender  Extremities:  No peripheral edema  Neurologic:  Alert, oriented, able to follow commands  Skin:  No acute rashes     Lab Results Lab Results  Component Value Date   WBC 9.0 10/16/2020   HGB 10.9 (L) 10/16/2020   HCT 34.8 (L) 10/16/2020   MCV 84.3 10/16/2020   PLT 375 10/16/2020    Lab Results  Component Value Date   CREATININE 6.82 (H) 10/16/2020   BUN 36 (H) 10/16/2020   NA 135 10/16/2020   K 4.7 10/16/2020   CL 103 10/16/2020   CO2 19 (L) 10/16/2020    Lab Results  Component Value Date   ALT 10 10/15/2020   AST 18 10/15/2020   ALKPHOS 111 10/15/2020   BILITOT 0.8 10/15/2020     Microbiology: Recent Results (from the past 240 hour(s))  SARS CORONAVIRUS 2 (TAT 6-24 HRS) Nasopharyngeal Nasopharyngeal Swab     Status: None   Collection Time: 10/09/20  7:55 PM   Specimen: Nasopharyngeal Swab  Result Value Ref Range Status   SARS Coronavirus 2  NEGATIVE NEGATIVE Final    Comment: (NOTE) SARS-CoV-2 target nucleic acids are NOT DETECTED.  The SARS-CoV-2 RNA is generally detectable in upper and lower respiratory specimens during the acute phase of infection. Negative results do not preclude SARS-CoV-2 infection, do not rule out co-infections with other pathogens, and should not be used as the sole basis for treatment or other patient management decisions. Negative results must be combined with clinical observations, patient history, and epidemiological information. The expected result is Negative.  Fact Sheet for  Patients: SugarRoll.be  Fact Sheet for Healthcare Providers: https://www.woods-mathews.com/  This test is not yet approved or cleared by the [place] FDA and  has been authorized for detection and/or diagnosis of SARS-CoV-2 by FDA under an Emergency Use Authorization (EUA). This EUA will remain  in effect (meaning this test can be used) for the duration of the COVID-19 declaration under Se ction 564(b)(1) of the Act, 21 U.S.C. section 360bbb-3(b)(1), unless the authorization is terminated or revoked sooner.  Performed at [facility], [place] [address], [place], [place] [address]   Resp Panel by RT-PCR (Flu A&B, Covid) Nasopharyngeal Swab     Status: None   Collection Time: 10/15/20 12:20 PM   Specimen: Nasopharyngeal Swab; Nasopharyngeal(NP) swabs in vial transport medium  Result Value Ref Range Status   SARS Coronavirus 2 by RT PCR NEGATIVE NEGATIVE Final    Comment: (NOTE) SARS-CoV-2 target nucleic acids are NOT DETECTED.  The SARS-CoV-2 RNA is generally detectable in upper respiratory specimens during the acute phase of infection. The lowest concentration of SARS-CoV-2 viral copies this assay can detect is 138 copies/mL. A negative result does not preclude SARS-Cov-2 infection and should not be used as the sole basis for treatment or other patient management decisions. A negative result may occur with  improper specimen collection/handling, submission of specimen other than nasopharyngeal swab, presence of viral mutation(s) within the areas targeted by this assay, and inadequate number of viral copies(<138 copies/mL). A negative result must be combined with clinical observations, patient history, and epidemiological information. The expected result is Negative.  Fact Sheet for Patients:  EntrepreneurPulse.com.au  Fact Sheet for Healthcare Providers:  IncredibleEmployment.be  This test is  no t yet approved or cleared by the [place] FDA and  has been authorized for detection and/or diagnosis of SARS-CoV-2 by FDA under an Emergency Use Authorization (EUA). This EUA will remain  in effect (meaning this test can be used) for the duration of the COVID-19 declaration under Section 564(b)(1) of the Act, 21 U.S.C.section 360bbb-3(b)(1), unless the authorization is terminated  or revoked sooner.       Influenza A by PCR NEGATIVE NEGATIVE Final   Influenza B by PCR NEGATIVE NEGATIVE Final    Comment: (NOTE) The Xpert Xpress SARS-CoV-2/FLU/RSV plus assay is intended as an aid in the diagnosis of influenza from Nasopharyngeal swab specimens and should not be used as a sole basis for treatment. Nasal washings and aspirates are unacceptable for Xpert Xpress SARS-CoV-2/FLU/RSV testing.  Fact Sheet for Patients: EntrepreneurPulse.com.au  Fact Sheet for Healthcare Providers: IncredibleEmployment.be  This test is not yet approved or cleared by the [place] FDA and has been authorized for detection and/or diagnosis of SARS-CoV-2 by FDA under an Emergency Use Authorization (EUA). This EUA will remain in effect (meaning this test can be used) for the duration of the COVID-19 declaration under Section 564(b)(1) of the Act, 21 U.S.C. section 360bbb-3(b)(1), unless the authorization is terminated or revoked.  Performed at [facility],  [place], [place] [address]     Medications: Scheduled Meds: . amLODipine  10 mg Oral Daily  . multivitamin with minerals  1 tablet Oral Daily  . sodium bicarbonate  1,300 mg Oral TID  . traZODone  25 mg Oral QHS   Continuous Infusions: PRN Meds:.acetaminophen **OR** acetaminophen, labetalol, ondansetron **OR** ondansetron (ZOFRAN) IV  No Known Allergies  Urinalysis: Recent Labs    10/15/20 1630  COLORURINE YELLOW*  LABSPEC 1.004*  PHURINE 5.0  GLUCOSEU NEGATIVE   HGBUR LARGE*  BILIRUBINUR NEGATIVE  KETONESUR NEGATIVE  PROTEINUR 30*  NITRITE NEGATIVE  LEUKOCYTESUR LARGE*      Imaging: DG Chest Port 1 View  Result Date: 10/15/2020 CLINICAL DATA:  AK I EXAM: PORTABLE CHEST 1 VIEW COMPARISON:  Same day CT abdomen pelvis and chest radiograph September 04, 2020. FINDINGS: The heart size and mediastinal contours are within normal limits. Low lung volumes with bibasilar linear atelectasis. Small left pleural effusion with adjacent consolidative opacity. No visible pneumothorax. The visualized skeletal structures are unchanged. IMPRESSION: Small left pleural effusion with adjacent left lower lobe consolidative opacity, which may reflect atelectasis and/or infection. Electronically Signed   By: Dahlia Bailiff MD   On: 10/15/2020 16:03   CT Renal Stone Study  Result Date: 10/15/2020 CLINICAL DATA:  Renal insufficiency. EXAM: CT ABDOMEN AND PELVIS WITHOUT CONTRAST TECHNIQUE: Multidetector CT imaging of the abdomen and pelvis was performed following the standard protocol without IV contrast. COMPARISON:  09/30/2020. FINDINGS: Lower chest: Basilar atelectasis again noted bilaterally with interval increase in posterior left lower lobe collapse/consolidative opacity. Small left pleural effusion has progressed in the interval. Hepatobiliary: No focal abnormality in the liver on this study without intravenous contrast. Layering sludge noted in the distended gallbladder. No intrahepatic or extrahepatic biliary dilation. Pancreas: No focal mass lesion. No dilatation of the main duct. No intraparenchymal cyst. No peripancreatic edema. Spleen: No splenomegaly. No focal mass lesion. Adrenals/Urinary Tract: No adrenal nodule or mass. Mild hydronephrosis in both kidneys is similar to prior with bilateral internal ureteral stents visualized. No associated hydroureter. The bladder is decompressed. Stomach/Bowel: Tiny hiatal hernia. Stomach otherwise unremarkable. Duodenum is normally  positioned as is the ligament of Treitz. No small bowel wall thickening. No small bowel dilatation. The terminal ileum is normal. The appendix is not well visualized, but there is no edema or inflammation in the region of the cecum. No gross colonic mass. No colonic wall thickening. Diverticular changes are noted in the left colon without evidence of diverticulitis. Vascular/Lymphatic: No abdominal aortic aneurysm. No change in the retroperitoneal abdominopelvic lymphadenopathy, better characterized on the recent CT scan performed with intravenous contrast material. Reproductive: The uterus is unremarkable. Similar prominence of the cervix. There is no adnexal mass. Other: Interval increase in small to moderate volume abdominopelvic ascites with edema and nodularity in the omentum inferiorly, as before. Musculoskeletal: Small bilateral groin hernias contain only fat. No worrisome lytic or sclerotic osseous abnormality. IMPRESSION: 1. Mild bilateral hydronephrosis is similar to prior, now with bilateral internal ureteral stents in situ. No evidence for hydroureter in the bladder is decompressed. 2. Similar appearance of upper normal to mildly enlarged retroperitoneal and pelvic sidewall lymphadenopathy. This was better evaluated on recent CT with IV contrast. 3. Interval progression of ascites, now small to moderate volume. There is associated edema/caking in the omentum concerning for metastatic disease. 4. Small bilateral groin hernias contain only fat. 5. Left colonic diverticulosis without diverticulitis. Electronically Signed   By: Verda Cumins.D.  On: 10/15/2020 13:06      Assessment/Plan:  KYNEDI PROFITT is a 65 y.o. female with medical problems of  Metastatic malignancy of GYN origin with omental carcinomatosis, hypertension, obesity, enlarged thyroid gland  was admitted on 10/15/2020 for :  AKI (acute kidney injury) ([place]) [N17.9] Acute kidney failure due to procedure [N99.0]  #Acute kidney  injury #Bilateral hydronephrosis status post stent placement 10/10/2020 Baseline creatinine 0.90 from September 04, 2020 Differential includes failed urological stents due to tumor compression causing bilateral hydronephrosis versus component of ATN from intravascular volume depletion Urological evaluation is ongoing Bilateral nephrostomy tubes are being considered if Creatinine does not improve Noted to have tacycardia and hypotension Will give iv NS 1000 cc x1    #Hypertension Currently managed with amlodipine IV labetalol as needed Avoid ACE inhibitor/ARB at the moment until renal function stabilizes  Lotus Santillo Candiss Norse 10/16/20

## 2020-10-17 ENCOUNTER — Ambulatory Visit
Admission: AD | Admit: 2020-10-17 | Payer: BC Managed Care – PPO | Source: Ambulatory Visit | Admitting: Internal Medicine

## 2020-10-17 ENCOUNTER — Other Ambulatory Visit: Payer: BC Managed Care – PPO

## 2020-10-17 ENCOUNTER — Inpatient Hospital Stay: Payer: BC Managed Care – PPO | Admitting: Oncology

## 2020-10-17 DIAGNOSIS — N179 Acute kidney failure, unspecified: Secondary | ICD-10-CM | POA: Diagnosis not present

## 2020-10-17 LAB — CBC WITH DIFFERENTIAL/PLATELET
Abs Immature Granulocytes: 0.03 10*3/uL (ref 0.00–0.07)
Basophils Absolute: 0 10*3/uL (ref 0.0–0.1)
Basophils Relative: 0 %
Eosinophils Absolute: 0.1 10*3/uL (ref 0.0–0.5)
Eosinophils Relative: 2 %
HCT: 32.6 % — ABNORMAL LOW (ref 36.0–46.0)
Hemoglobin: 10.2 g/dL — ABNORMAL LOW (ref 12.0–15.0)
Immature Granulocytes: 0 %
Lymphocytes Relative: 17 %
Lymphs Abs: 1.3 10*3/uL (ref 0.7–4.0)
MCH: 26.5 pg (ref 26.0–34.0)
MCHC: 31.3 g/dL (ref 30.0–36.0)
MCV: 84.7 fL (ref 80.0–100.0)
Monocytes Absolute: 0.9 10*3/uL (ref 0.1–1.0)
Monocytes Relative: 12 %
Neutro Abs: 5.1 10*3/uL (ref 1.7–7.7)
Neutrophils Relative %: 69 %
Platelets: 347 10*3/uL (ref 150–400)
RBC: 3.85 MIL/uL — ABNORMAL LOW (ref 3.87–5.11)
RDW: 13.2 % (ref 11.5–15.5)
WBC: 7.4 10*3/uL (ref 4.0–10.5)
nRBC: 0 % (ref 0.0–0.2)

## 2020-10-17 LAB — BASIC METABOLIC PANEL
Anion gap: 10 (ref 5–15)
BUN: 31 mg/dL — ABNORMAL HIGH (ref 8–23)
CO2: 21 mmol/L — ABNORMAL LOW (ref 22–32)
Calcium: 8.6 mg/dL — ABNORMAL LOW (ref 8.9–10.3)
Chloride: 108 mmol/L (ref 98–111)
Creatinine, Ser: 5.11 mg/dL — ABNORMAL HIGH (ref 0.44–1.00)
GFR, Estimated: 9 mL/min — ABNORMAL LOW (ref >60)
Glucose, Bld: 104 mg/dL — ABNORMAL HIGH (ref 70–99)
Potassium: 4.5 mmol/L (ref 3.5–5.1)
Sodium: 139 mmol/L (ref 135–145)

## 2020-10-17 MED ORDER — METOPROLOL TARTRATE 25 MG PO TABS
12.5000 mg | ORAL_TABLET | Freq: Two times a day (BID) | ORAL | Status: DC
  Administered 2020-10-19 – 2020-10-21 (×5): 12.5 mg via ORAL
  Filled 2020-10-17 (×6): qty 1

## 2020-10-17 NOTE — Progress Notes (Signed)
Urology Consult Follow Up  Subjective: She is resting comfortably with her husband at her side.  VSS afebrile   Serum creatinine down to 5.11 from 7.90 two days ago.  Good UOP.  Urine yellow clear in overnight bag.    Anti-infectives: Anti-infectives (From admission, onward)   None      Current Facility-Administered Medications  Medication Dose Route Frequency Provider Last Rate Last Admin  . acetaminophen (TYLENOL) tablet 650 mg  650 mg Oral Q6H PRN Cox, Amy N, DO       Or  . acetaminophen (TYLENOL) suppository 650 mg  650 mg Rectal Q6H PRN Cox, Amy N, DO      . amLODipine (NORVASC) tablet 10 mg  10 mg Oral Daily Murlean Iba, MD   10 mg at 10/16/20 0840  . Chlorhexidine Gluconate Cloth 2 % PADS 6 each  6 each Topical Q0600 Florencia Reasons, MD      . labetalol (NORMODYNE) injection 10 mg  10 mg Intravenous Q4H PRN Sharion Settler, NP      . multivitamin with minerals tablet 1 tablet  1 tablet Oral Daily Cox, Amy N, DO   1 tablet at 10/16/20 0840  . ondansetron (ZOFRAN) tablet 4 mg  4 mg Oral Q6H PRN Cox, Amy N, DO       Or  . ondansetron (ZOFRAN) injection 4 mg  4 mg Intravenous Q6H PRN Cox, Amy N, DO      . psyllium (HYDROCIL/METAMUCIL) 1 packet  1 packet Oral Daily Florencia Reasons, MD      . sodium bicarbonate tablet 1,300 mg  1,300 mg Oral TID Sharion Settler, NP   1,300 mg at 10/16/20 2242  . traZODone (DESYREL) tablet 25 mg  25 mg Oral QHS Sharion Settler, NP   25 mg at 10/16/20 2242     Objective: Vital signs in last 24 hours: Temp:  [97.6 F (36.4 C)-98.4 F (36.9 C)] 98.2 F (36.8 C) (04/28 0759) Pulse Rate:  [109-121] 117 (04/28 0759) Resp:  [18-24] 18 (04/28 0759) BP: (120-147)/(64-93) 134/79 (04/28 0759) SpO2:  [95 %-100 %] 95 % (04/28 0759)  Intake/Output from previous day: 04/27 0701 - 04/28 0700 In: 358 [P.O.:358] Out: 2550 [Urine:2550] Intake/Output this shift: No intake/output data recorded.   Physical Exam Constitutional:  Well nourished. Alert and  oriented, No acute distress. HEENT: [place] AT, mask in place.  Trachea midline Cardiovascular: No clubbing, cyanosis, or edema. Respiratory: Normal respiratory effort, no increased work of breathing. GU: No CVA tenderness.  No bladder fullness or masses.  Neurologic: Grossly intact, no focal deficits, moving all 4 extremities. Psychiatric: Normal mood and affect.   Lab Results:  Recent Labs    10/16/20 0603 10/17/20 0353  WBC 9.0 7.4  HGB 10.9* 10.2*  HCT 34.8* 32.6*  PLT 375 347   BMET Recent Labs    10/16/20 0603 10/17/20 0353  NA 135 139  K 4.7 4.5  CL 103 108  CO2 19* 21*  GLUCOSE 98 104*  BUN 36* 31*  CREATININE 6.82* 5.11*  CALCIUM 8.6* 8.6*   PT/INR No results for input(s): LABPROT, INR in the last 72 hours. ABG Recent Labs    10/15/20 2135  HCO3 18.9*    Studies/Results: DG Chest Port 1 View  Result Date: 10/15/2020 CLINICAL DATA:  AK I EXAM: PORTABLE CHEST 1 VIEW COMPARISON:  Same day CT abdomen pelvis and chest radiograph September 04, 2020. FINDINGS: The heart size and mediastinal contours are within normal limits. Low lung volumes  with bibasilar linear atelectasis. Small left pleural effusion with adjacent consolidative opacity. No visible pneumothorax. The visualized skeletal structures are unchanged. IMPRESSION: Small left pleural effusion with adjacent left lower lobe consolidative opacity, which may reflect atelectasis and/or infection. Electronically Signed   By: Dahlia Bailiff MD   On: 10/15/2020 16:03   CT Renal Stone Study  Result Date: 10/15/2020 CLINICAL DATA:  Renal insufficiency. EXAM: CT ABDOMEN AND PELVIS WITHOUT CONTRAST TECHNIQUE: Multidetector CT imaging of the abdomen and pelvis was performed following the standard protocol without IV contrast. COMPARISON:  09/30/2020. FINDINGS: Lower chest: Basilar atelectasis again noted bilaterally with interval increase in posterior left lower lobe collapse/consolidative opacity. Small left pleural effusion  has progressed in the interval. Hepatobiliary: No focal abnormality in the liver on this study without intravenous contrast. Layering sludge noted in the distended gallbladder. No intrahepatic or extrahepatic biliary dilation. Pancreas: No focal mass lesion. No dilatation of the main duct. No intraparenchymal cyst. No peripancreatic edema. Spleen: No splenomegaly. No focal mass lesion. Adrenals/Urinary Tract: No adrenal nodule or mass. Mild hydronephrosis in both kidneys is similar to prior with bilateral internal ureteral stents visualized. No associated hydroureter. The bladder is decompressed. Stomach/Bowel: Tiny hiatal hernia. Stomach otherwise unremarkable. Duodenum is normally positioned as is the ligament of Treitz. No small bowel wall thickening. No small bowel dilatation. The terminal ileum is normal. The appendix is not well visualized, but there is no edema or inflammation in the region of the cecum. No gross colonic mass. No colonic wall thickening. Diverticular changes are noted in the left colon without evidence of diverticulitis. Vascular/Lymphatic: No abdominal aortic aneurysm. No change in the retroperitoneal abdominopelvic lymphadenopathy, better characterized on the recent CT scan performed with intravenous contrast material. Reproductive: The uterus is unremarkable. Similar prominence of the cervix. There is no adnexal mass. Other: Interval increase in small to moderate volume abdominopelvic ascites with edema and nodularity in the omentum inferiorly, as before. Musculoskeletal: Small bilateral groin hernias contain only fat. No worrisome lytic or sclerotic osseous abnormality. IMPRESSION: 1. Mild bilateral hydronephrosis is similar to prior, now with bilateral internal ureteral stents in situ. No evidence for hydroureter in the bladder is decompressed. 2. Similar appearance of upper normal to mildly enlarged retroperitoneal and pelvic sidewall lymphadenopathy. This was better evaluated on recent  CT with IV contrast. 3. Interval progression of ascites, now small to moderate volume. There is associated edema/caking in the omentum concerning for metastatic disease. 4. Small bilateral groin hernias contain only fat. 5. Left colonic diverticulosis without diverticulitis. Electronically Signed   By: Misty Stanley M.D.   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  CT scan from yesterday noted the stents are in good position and that the hydronephrosis was not severe and plans were made to place a Foley catheter to continue to monitor renal function in hopes that maximum urinary decompression would address the rise in creatinine.  Her serum creatinine is continuing to improve, so we will not plan any further intervention at this time continue to monitor her renal function.  I did review with her the procedure, risks and aftercare nephrostomy tubes if they do need to be placed in the future.  She voiced her understanding and stated if in the future they are needed she  is willing to have them placed.  I have advanced her diet today.  We will make her n.p.o. at midnight again and reassess her tomorrow as we continue to trend her renal function.     LOS: 1 day    [facility] 10/17/2020

## 2020-10-17 NOTE — Progress Notes (Signed)
PROGRESS NOTE    Tracy Goodman  KZL:[id number] DOB: 06/29/1955 DOA: 10/15/2020 PCP: [name], MD    Chief Complaint  Patient presents with  . Abnormal Lab    Brief Narrative:  Tracy Goodman is a 65 y.o. female with medical history significant for hypertension, hyperlipidemia, Tracy Goodman, newly diagnosed gynecologic carcinoma suspect Tracy Goodman, presents to the emergency department at the request of cancer center for abnormal labs, Tracy Goodman, likely obstructive nephropathy   Subjective:  She denies pain, foley in place 2.5liter urine output last 24hrs  Assessment & Plan:   Principal Problem:   Tracy Goodman (acute kidney injury) ([place]) Active Problems:   Hyperlipidemia   Hypertension   Tracy Goodman ([name])   Enlarged thyroid gland   Umbilical hernia   Breast lump on right side at 10 o'clock position   Bilateral hydronephrosis   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)   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[place])   Palliative care encounter  Tracy Goodman/metabolic acidosis -Baseline creatinine was normal in March 2022 -Suspect Tracy Goodman due to post obstructive nephropathy -Continue sodium bicarb placement, she received gentle hydration initially -cr 7.9 on presentation, cr today is 5.11  -Urology and nephrology following, currently have a Foley catheter, possible bilateral nephrostomy tube placement tomorrow  Normocytic anemia No overt sign of bleeding Hemoglobin 10.9 Monitor  HTN With sinus tachycardia, she agrees to switch from norvasc to  [employer] from tomorrow  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 with widespread intra-abdominal adenopathy, bladder involvement as well as malignant ascites and omental carcinomatosis  -Management per oncology and palliative care  Right-sided breast mass at the 10 o'clock position-outpatient follow-up  Insomnia- trazodone 25 mg nightly  The patient's BMI is: Body mass index is 48.47 kg/m.Marland Kitchen      Unresulted Labs (From admission, onward)          Start     Ordered   10/17/20 3903  Basic metabolic panel  Daily,   R      10/16/20 1727            DVT prophylaxis: Place TED hose Start: 10/15/20 1508   Code Status: Full Family Communication: Patient Disposition:   Status is: Inpatient  Dispo: The patient is from: Home              Anticipated d/c is to: Home              Anticipated d/c date is: To be determined, need renal function improvement                Consultants:   Urology  Nephrology  Hematology/oncology  Palliative care  Procedures:   Foley placement   Antimicrobials:   None     Objective: Vitals:   10/16/20 2243 10/17/20 0358 10/17/20 0759 10/17/20 1157  BP: (!) 144/93 120/64 134/79 134/78  Pulse: (!) 115 (!) 115 (!) 117 (!) 114  Resp: 18 20 18    Temp: 98.3 F (36.8 C) 98.4 F (36.9 C) 98.2 F (36.8 C) (!) 97.5 F (36.4 C)  TempSrc: Oral Oral Oral Oral  SpO2: 99% 95% 95% 96%  Weight:      Height:        Intake/Output Summary (Last 24 hours) at 10/17/2020 1436 Last data filed at 10/17/2020 1337 Gross per 24 hour  Intake 598 ml  Output 2000 ml  Net -1402 ml   Filed Weights   10/15/20 1156  Weight: 108.9 kg    Examination:  General exam: calm, NAD Respiratory system: Clear to auscultation. Respiratory effort normal. Cardiovascular system: S1 & S2 heard, tachycardia, No pedal edema. Gastrointestinal system: Abdomen is mildly distended, soft and nontender.  Normal bowel sounds heard. Central nervous system: Alert and oriented. No focal neurological deficits. Extremities: Symmetric 5 x 5 power. Skin: No rashes, lesions or ulcers Psychiatry: Judgement and insight appear normal. Mood & affect appropriate.     Data Reviewed: I have personally reviewed following labs and imaging studies  CBC: Recent Labs  Lab 10/11/20 0538 10/15/20 1249 10/16/20 0603 10/17/20 0353  WBC 8.6 8.2 9.0 7.4  NEUTROABS  --  6.2  --   5.1  HGB 11.3* 11.4* 10.9* 10.2*  HCT 37.3 36.6 34.8* 32.6*  MCV 87.4 85.7 84.3 84.7  PLT 421* 356 375 097    Basic Metabolic Panel: Recent Labs  Lab 10/11/20 0538 10/15/20 1029 10/15/20 1514 10/15/20 2135 10/16/20 0603 10/17/20 0353  NA 140 133* 129*  --  135 139  K 4.5 4.4 4.4  --  4.7 4.5  CL 109 101 99  --  103 108  CO2 21* 17* 18*  --  19* 21*  GLUCOSE 100* 107* 101*  --  98 104*  BUN 17 35* 36*  --  36* 31*  CREATININE 2.27* 7.87* 7.90*  --  6.82* 5.11*  CALCIUM 8.9 8.7* 8.7*  --  8.6* 8.6*  MG 2.3  --   --  2.3  --   --     GFR: Estimated Creatinine Clearance: 12.4 mL/min (A) (by C-G formula based on SCr of 5.11 mg/dL (H)).  Liver Function Tests: Recent Labs  Lab 10/15/20 1514  AST 18  ALT 10  ALKPHOS 111  BILITOT 0.8  PROT 7.1  ALBUMIN 2.9*    CBG: No results for input(s): GLUCAP in the last 168 hours.   Recent Results (from the past 240 hour(s))  SARS CORONAVIRUS 2 (TAT 6-24 HRS) Nasopharyngeal Nasopharyngeal Swab     Status: None   Collection Time: 10/09/20  7:55 PM   Specimen: Nasopharyngeal Swab  Result Value Ref Range Status   SARS Coronavirus 2 NEGATIVE NEGATIVE Final    Comment: (NOTE) SARS-CoV-2 target nucleic acids are NOT DETECTED.  The SARS-CoV-2 RNA is generally detectable in upper and lower respiratory specimens during the acute phase of infection. Negative results do not preclude SARS-CoV-2 infection, do not rule out co-infections with other pathogens, and should not be used as the sole basis for treatment or other patient management decisions. Negative results must be combined with clinical observations, patient history, and epidemiological information. The expected result is Negative.  Fact Sheet for Patients: SugarRoll.be  Fact Sheet for Healthcare Providers: https://www.woods-mathews.com/  This test is not yet approved or cleared by the [place] FDA and  has been authorized  for detection and/or diagnosis of SARS-CoV-2 by FDA under an Emergency Use Authorization (EUA). This EUA will remain  in effect (meaning this test can be used) for the duration of the COVID-19 declaration under Se ction 564(b)(1) of the Act, 21 U.S.C. section 360bbb-3(b)(1), unless the authorization is terminated or revoked sooner.  Performed at [facility], [place] [address], [place], [place] [address]   Resp Panel by RT-PCR (Flu A&B, Covid) Nasopharyngeal Swab     Status: None   Collection Time: 10/15/20 12:20 PM   Specimen: Nasopharyngeal Swab; Nasopharyngeal(NP) swabs in vial transport medium  Result Value Ref Range Status   SARS Coronavirus 2 by RT PCR NEGATIVE  NEGATIVE Final    Comment: (NOTE) SARS-CoV-2 target nucleic acids are NOT DETECTED.  The SARS-CoV-2 RNA is generally detectable in upper respiratory specimens during the acute phase of infection. The lowest concentration of SARS-CoV-2 viral copies this assay can detect is 138 copies/mL. A negative result does not preclude SARS-Cov-2 infection and should not be used as the sole basis for treatment or other patient management decisions. A negative result may occur with  improper specimen collection/handling, submission of specimen other than nasopharyngeal swab, presence of viral mutation(s) within the areas targeted by this assay, and inadequate number of viral copies(<138 copies/mL). A negative result must be combined with clinical observations, patient history, and epidemiological information. The expected result is Negative.  Fact Sheet for Patients:  EntrepreneurPulse.com.au  Fact Sheet for Healthcare Providers:  IncredibleEmployment.be  This test is no t yet approved or cleared by the [place] FDA and  has been authorized for detection and/or diagnosis of SARS-CoV-2 by FDA under an Emergency Use Authorization (EUA). This EUA will remain  in effect (meaning this test  can be used) for the duration of the COVID-19 declaration under Section 564(b)(1) of the Act, 21 U.S.C.section 360bbb-3(b)(1), unless the authorization is terminated  or revoked sooner.       Influenza A by PCR NEGATIVE NEGATIVE Final   Influenza B by PCR NEGATIVE NEGATIVE Final    Comment: (NOTE) The Xpert Xpress SARS-CoV-2/FLU/RSV plus assay is intended as an aid in the diagnosis of influenza from Nasopharyngeal swab specimens and should not be used as a sole basis for treatment. Nasal washings and aspirates are unacceptable for Xpert Xpress SARS-CoV-2/FLU/RSV testing.  Fact Sheet for Patients: EntrepreneurPulse.com.au  Fact Sheet for Healthcare Providers: IncredibleEmployment.be  This test is not yet approved or cleared by the [place] FDA and has been authorized for detection and/or diagnosis of SARS-CoV-2 by FDA under an Emergency Use Authorization (EUA). This EUA will remain in effect (meaning this test can be used) for the duration of the COVID-19 declaration under Section 564(b)(1) of the Act, 21 U.S.C. section 360bbb-3(b)(1), unless the authorization is terminated or revoked.  Performed at [facility], [address] [place], [place] [address]          Radiology Studies: DG Chest [place] 1 View  Result Date: 10/15/2020 CLINICAL DATA:  AK I EXAM: PORTABLE CHEST 1 VIEW COMPARISON:  Same day CT abdomen pelvis and chest radiograph September 04, 2020. FINDINGS: The heart size and mediastinal contours are within normal limits. Low lung volumes with bibasilar linear atelectasis. Small left pleural effusion with adjacent consolidative opacity. No visible pneumothorax. The visualized skeletal structures are unchanged. IMPRESSION: Small left pleural effusion with adjacent left lower lobe consolidative opacity, which may reflect atelectasis and/or infection. Electronically Signed   By: Dahlia Bailiff MD   On: 10/15/2020 16:03         Scheduled Meds: . Chlorhexidine Gluconate Cloth  6 each Topical Q0600  . [START ON 10/18/2020] metoprolol tartrate  12.5 mg Oral BID  . multivitamin with minerals  1 tablet Oral Daily  . psyllium  1 packet Oral Daily  . sodium bicarbonate  1,300 mg Oral TID  . traZODone  25 mg Oral QHS   Continuous Infusions:    LOS: 1 day   Time spent: 29mins Greater than 50% of this time was spent in counseling, explanation of diagnosis, planning of further management, and coordination of care.   Voice Recognition Viviann Spare dictation system was used to create this note, attempts  have been made to correct errors. Please contact the author with questions and/or clarifications.   Florencia Reasons, MD PhD FACP [facility]  Available via Epic secure chat 7am-7pm for nonurgent issues Please page for urgent issues To page the attending provider between 7A-7P or the covering provider during after hours 7P-7A, please log into the web site [email or web address] and access using universal [place] password for that web site. If you do not have the password, please call the hospital operator.    10/17/2020, 2:36 PM

## 2020-10-17 NOTE — Progress Notes (Signed)
[facility]  ROUNDING NOTE   Subjective:   Tracy Goodman is a 65 y.o. 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   Patient seen sitting at the side of the bed Alert and oriented Tolerating meals and denies nausea Denies shortness of breath  Objective:  Vital signs in last 24 hours:  Temp:  [97.5 F (36.4 C)-98.4 F (36.9 C)] 97.5 F (36.4 C) (04/28 1157) Pulse Rate:  [114-121] 114 (04/28 1157) Resp:  [18-24] 18 (04/28 0759) BP: (120-146)/(64-93) 134/78 (04/28 1157) SpO2:  [95 %-99 %] 96 % (04/28 1157)  Weight change:  Filed Weights   10/15/20 1156  Weight: 108.9 kg    Intake/Output: I/O last 3 completed shifts: In: 32 [P.O.:358] Out: 3080 [Urine:3050; Emesis/NG output:30]   Intake/Output this shift:  Total I/O In: 240 [P.O.:240] Out: -   Physical Exam: General: NAD  Head: Normocephalic, atraumatic. Moist oral mucosal membranes  Eyes: Anicteric  Lungs:  Clear to auscultation  Heart: Regular rate and rhythm  Abdomen:  Soft, nontender,   Extremities:  no peripheral edema.  Neurologic: Alert, moving all four extremities  Skin: No lesions       Basic Metabolic Panel: Recent Labs  Lab 10/11/20 0538 10/15/20 1029 10/15/20 1514 10/15/20 2135 10/16/20 0603 10/17/20 0353  NA 140 133* 129*  --  135 139  K 4.5 4.4 4.4  --  4.7 4.5  CL 109 101 99  --  103 108  CO2 21* 17* 18*  --  19* 21*  GLUCOSE 100* 107* 101*  --  98 104*  BUN 17 35* 36*  --  36* 31*  CREATININE 2.27* 7.87* 7.90*  --  6.82* 5.11*  CALCIUM 8.9 8.7* 8.7*  --  8.6* 8.6*  MG 2.3  --   --  2.3  --   --     Liver Function Tests: Recent Labs  Lab 10/15/20 1514  AST 18  ALT 10  ALKPHOS 111  BILITOT 0.8  PROT 7.1  ALBUMIN 2.9*   No results for input(s): LIPASE, AMYLASE in the last 168 hours. No results for input(s): AMMONIA in the last 168 hours.  CBC: Recent Labs  Lab 10/11/20 0538  10/15/20 1249 10/16/20 0603 10/17/20 0353  WBC 8.6 8.2 9.0 7.4  NEUTROABS  --  6.2  --  5.1  HGB 11.3* 11.4* 10.9* 10.2*  HCT 37.3 36.6 34.8* 32.6*  MCV 87.4 85.7 84.3 84.7  PLT 421* 356 375 347    Cardiac Enzymes: No results for input(s): CKTOTAL, CKMB, CKMBINDEX, TROPONINI in the last 168 hours.  BNP: Invalid input(s): POCBNP  CBG: No results for input(s): GLUCAP in the last 168 hours.  Microbiology: Results for orders placed or performed during the hospital encounter of 10/15/20  Resp Panel by RT-PCR (Flu A&B, Covid) Nasopharyngeal Swab     Status: None   Collection Time: 10/15/20 12:20 PM   Specimen: Nasopharyngeal Swab; Nasopharyngeal(NP) swabs in vial transport medium  Result Value Ref Range Status   SARS Coronavirus 2 by RT PCR NEGATIVE NEGATIVE Final    Comment: (NOTE) SARS-CoV-2 target nucleic acids are NOT DETECTED.  The SARS-CoV-2 RNA is generally detectable in upper respiratory specimens during the acute phase of infection. The lowest concentration of SARS-CoV-2 viral copies this assay can detect is 138 copies/mL. A negative result does not preclude SARS-Cov-2 infection and should not be used as the sole basis for treatment or other patient management decisions.  A negative result may occur with  improper specimen collection/handling, submission of specimen other than nasopharyngeal swab, presence of viral mutation(s) within the areas targeted by this assay, and inadequate number of viral copies(<138 copies/mL). A negative result must be combined with clinical observations, patient history, and epidemiological information. The expected result is Negative.  Fact Sheet for Patients:  EntrepreneurPulse.com.au  Fact Sheet for Healthcare Providers:  IncredibleEmployment.be  This test is no t yet approved or cleared by the [place] FDA and  has been authorized for detection and/or diagnosis of SARS-CoV-2 by FDA under an  Emergency Use Authorization (EUA). This EUA will remain  in effect (meaning this test can be used) for the duration of the COVID-19 declaration under Section 564(b)(1) of the Act, 21 U.S.C.section 360bbb-3(b)(1), unless the authorization is terminated  or revoked sooner.       Influenza A by PCR NEGATIVE NEGATIVE Final   Influenza B by PCR NEGATIVE NEGATIVE Final    Comment: (NOTE) The Xpert Xpress SARS-CoV-2/FLU/RSV plus assay is intended as an aid in the diagnosis of influenza from Nasopharyngeal swab specimens and should not be used as a sole basis for treatment. Nasal washings and aspirates are unacceptable for Xpert Xpress SARS-CoV-2/FLU/RSV testing.  Fact Sheet for Patients: EntrepreneurPulse.com.au  Fact Sheet for Healthcare Providers: IncredibleEmployment.be  This test is not yet approved or cleared by the [place] FDA and has been authorized for detection and/or diagnosis of SARS-CoV-2 by FDA under an Emergency Use Authorization (EUA). This EUA will remain in effect (meaning this test can be used) for the duration of the COVID-19 declaration under Section 564(b)(1) of the Act, 21 U.S.C. section 360bbb-3(b)(1), unless the authorization is terminated or revoked.  Performed at [facility], [address] [place], [place] [address]     Coagulation Studies: No results for input(s): LABPROT, INR in the last 72 hours.  Urinalysis: Recent Labs    10/15/20 1630  COLORURINE YELLOW*  LABSPEC 1.004*  PHURINE 5.0  GLUCOSEU NEGATIVE  HGBUR LARGE*  BILIRUBINUR NEGATIVE  KETONESUR NEGATIVE  PROTEINUR 30*  NITRITE NEGATIVE  LEUKOCYTESUR LARGE*      Imaging: DG Chest [place] 1 View  Result Date: 10/15/2020 CLINICAL DATA:  AK I EXAM: PORTABLE CHEST 1 VIEW COMPARISON:  Same day CT abdomen pelvis and chest radiograph September 04, 2020. FINDINGS: The heart size and mediastinal contours are within normal limits. Low lung  volumes with bibasilar linear atelectasis. Small left pleural effusion with adjacent consolidative opacity. No visible pneumothorax. The visualized skeletal structures are unchanged. IMPRESSION: Small left pleural effusion with adjacent left lower lobe consolidative opacity, which may reflect atelectasis and/or infection. Electronically Signed   By: Dahlia Bailiff MD   On: 10/15/2020 16:03   CT Renal Stone Study  Result Date: 10/15/2020 CLINICAL DATA:  Renal insufficiency. EXAM: CT ABDOMEN AND PELVIS WITHOUT CONTRAST TECHNIQUE: Multidetector CT imaging of the abdomen and pelvis was performed following the standard protocol without IV contrast. COMPARISON:  09/30/2020. FINDINGS: Lower chest: Basilar atelectasis again noted bilaterally with interval increase in posterior left lower lobe collapse/consolidative opacity. Small left pleural effusion has progressed in the interval. Hepatobiliary: No focal abnormality in the liver on this study without intravenous contrast. Layering sludge noted in the distended gallbladder. No intrahepatic or extrahepatic biliary dilation. Pancreas: No focal mass lesion. No dilatation of the main duct. No intraparenchymal cyst. No peripancreatic edema. Spleen: No splenomegaly. No focal mass lesion. Adrenals/Urinary Tract: No adrenal nodule or mass. Mild hydronephrosis in both kidneys is  similar to prior with bilateral internal ureteral stents visualized. No associated hydroureter. The bladder is decompressed. Stomach/Bowel: Tiny hiatal hernia. Stomach otherwise unremarkable. Duodenum is normally positioned as is the ligament of Treitz. No small bowel wall thickening. No small bowel dilatation. The terminal ileum is normal. The appendix is not well visualized, but there is no edema or inflammation in the region of the cecum. No gross colonic mass. No colonic wall thickening. Diverticular changes are noted in the left colon without evidence of diverticulitis. Vascular/Lymphatic: No  abdominal aortic aneurysm. No change in the retroperitoneal abdominopelvic lymphadenopathy, better characterized on the recent CT scan performed with intravenous contrast material. Reproductive: The uterus is unremarkable. Similar prominence of the cervix. There is no adnexal mass. Other: Interval increase in small to moderate volume abdominopelvic ascites with edema and nodularity in the omentum inferiorly, as before. Musculoskeletal: Small bilateral groin hernias contain only fat. No worrisome lytic or sclerotic osseous abnormality. IMPRESSION: 1. Mild bilateral hydronephrosis is similar to prior, now with bilateral internal ureteral stents in situ. No evidence for hydroureter in the bladder is decompressed. 2. Similar appearance of upper normal to mildly enlarged retroperitoneal and pelvic sidewall lymphadenopathy. This was better evaluated on recent CT with IV contrast. 3. Interval progression of ascites, now small to moderate volume. There is associated edema/caking in the omentum concerning for metastatic disease. 4. Small bilateral groin hernias contain only fat. 5. Left colonic diverticulosis without diverticulitis. Electronically Signed   By: Misty Stanley M.D.   On: 10/15/2020 13:06     Medications:    . Chlorhexidine Gluconate Cloth  6 each Topical Q0600  . [START ON 10/18/2020] metoprolol tartrate  12.5 mg Oral BID  . multivitamin with minerals  1 tablet Oral Daily  . psyllium  1 packet Oral Daily  . sodium bicarbonate  1,300 mg Oral TID  . traZODone  25 mg Oral QHS   acetaminophen **OR** acetaminophen, labetalol, ondansetron **OR** ondansetron (ZOFRAN) IV  Assessment/ Plan:  Ms. JACLYNN LAUMANN is a 65 y.o.  female   Acute kidney injury #Bilateral hydronephrosis status post stent placement 10/10/2020 Baseline creatinine 0.90 from September 04, 2020 Differential includes failed urological stents due to tumor compression causing bilateral hydronephrosis versus component of ATN from  intravascular volume depletion Urological evaluation is ongoing Bilateral nephrostomy tubes are being considered on daily basis based on Creatinine NS bolus given over 12 hours yesterday Creatinine improved Urine output adequate   Lab Results  Component Value Date   CREATININE 5.11 (H) 10/17/2020   CREATININE 6.82 (H) 10/16/2020   CREATININE 7.90 (H) 10/15/2020    Intake/Output Summary (Last 24 hours) at 10/17/2020 1235 Last data filed at 10/17/2020 0959 Gross per 24 hour  Intake 598 ml  Output 2000 ml  Net -1402 ml    #Hypertension Managed with amlodipine IV labetalol as needed Avoid ACE inhibitor/ARB at the moment until renal function stabilizes    LOS: 1 Jilene Spohr 4/28/202212:35 PM

## 2020-10-18 ENCOUNTER — Inpatient Hospital Stay: Payer: BC Managed Care – PPO

## 2020-10-18 ENCOUNTER — Other Ambulatory Visit: Payer: Self-pay

## 2020-10-18 DIAGNOSIS — Z515 Encounter for palliative care: Secondary | ICD-10-CM | POA: Diagnosis not present

## 2020-10-18 DIAGNOSIS — N179 Acute kidney failure, unspecified: Secondary | ICD-10-CM | POA: Diagnosis not present

## 2020-10-18 LAB — URINALYSIS, COMPLETE (UACMP) WITH MICROSCOPIC
Bilirubin Urine: NEGATIVE
Glucose, UA: NEGATIVE mg/dL
Ketones, ur: NEGATIVE mg/dL
Nitrite: NEGATIVE
Protein, ur: 30 mg/dL — AB
RBC / HPF: >50 RBC/hpf — ABNORMAL HIGH (ref 0–5)
Specific Gravity, Urine: 1.012 (ref 1.005–1.030)
WBC, UA: >50 WBC/hpf — ABNORMAL HIGH (ref 0–5)
pH: 6 (ref 5.0–8.0)

## 2020-10-18 LAB — BLOOD CULTURE ID PANEL (REFLEXED) - BCID2

## 2020-10-18 LAB — BASIC METABOLIC PANEL
Anion gap: 12 (ref 5–15)
BUN: 26 mg/dL — ABNORMAL HIGH (ref 8–23)
CO2: 20 mmol/L — ABNORMAL LOW (ref 22–32)
Calcium: 8.7 mg/dL — ABNORMAL LOW (ref 8.9–10.3)
Chloride: 108 mmol/L (ref 98–111)
Creatinine, Ser: 3.89 mg/dL — ABNORMAL HIGH (ref 0.44–1.00)
GFR, Estimated: 12 mL/min — ABNORMAL LOW (ref >60)
Glucose, Bld: 121 mg/dL — ABNORMAL HIGH (ref 70–99)
Potassium: 4.2 mmol/L (ref 3.5–5.1)
Sodium: 140 mmol/L (ref 135–145)

## 2020-10-18 LAB — LACTIC ACID, PLASMA
Lactic Acid, Venous: 2 mmol/L (ref 0.5–1.9)
Lactic Acid, Venous: 2.8 mmol/L (ref 0.5–1.9)
Lactic Acid, Venous: 2.3 mmol/L (ref 0.5–1.9)
Lactic Acid, Venous: 3.5 mmol/L (ref 0.5–1.9)

## 2020-10-18 MED ORDER — SODIUM BICARBONATE 650 MG PO TABS
650.0000 mg | ORAL_TABLET | Freq: Three times a day (TID) | ORAL | Status: DC
  Administered 2020-10-18 – 2020-10-20 (×6): 650 mg via ORAL
  Filled 2020-10-18 (×6): qty 1

## 2020-10-18 MED ORDER — SODIUM CHLORIDE 0.9 % IV BOLUS
500.0000 mL | Freq: Once | INTRAVENOUS | Status: AC
  Administered 2020-10-18: 500 mL via INTRAVENOUS

## 2020-10-18 MED ORDER — LACTATED RINGERS IV BOLUS
500.0000 mL | Freq: Once | INTRAVENOUS | Status: AC
  Administered 2020-10-18: 500 mL via INTRAVENOUS

## 2020-10-18 MED ORDER — SODIUM CHLORIDE 0.9 % IV SOLN
2.2500 g | Freq: Four times a day (QID) | INTRAVENOUS | Status: DC
  Filled 2020-10-18 (×3): qty 10

## 2020-10-18 MED ORDER — LACTATED RINGERS IV BOLUS
1000.0000 mL | Freq: Once | INTRAVENOUS | Status: AC
  Administered 2020-10-18: 1000 mL via INTRAVENOUS

## 2020-10-18 MED ORDER — LACTATED RINGERS IV SOLN: INTRAVENOUS | Status: AC

## 2020-10-18 MED ORDER — LABETALOL HCL 5 MG/ML IV SOLN
5.0000 mg | Freq: Once | INTRAVENOUS | Status: AC
  Administered 2020-10-18: 5 mg via INTRAVENOUS
  Filled 2020-10-18: qty 4

## 2020-10-18 MED ORDER — ACETAMINOPHEN 325 MG PO TABS
325.0000 mg | ORAL_TABLET | Freq: Once | ORAL | Status: AC
  Administered 2020-10-18: 325 mg via ORAL
  Filled 2020-10-18: qty 1

## 2020-10-18 MED ORDER — PIPERACILLIN-TAZOBACTAM IN DEX 2-0.25 GM/50ML IV SOLN
2.2500 g | Freq: Four times a day (QID) | INTRAVENOUS | Status: DC
  Administered 2020-10-18 – 2020-10-19 (×5): 2.25 g via INTRAVENOUS
  Filled 2020-10-18 (×8): qty 50

## 2020-10-18 NOTE — Progress Notes (Signed)
Dr Erlinda Hong present during shift change and red MEWS, order tranfer to progressive care, report called to [facility], husband aware and present for transport. Pt pleasant and cooperative. Transported to room 233 to continue care.

## 2020-10-18 NOTE — Progress Notes (Signed)
Pharmacy Antibiotic Note  Tracy Goodman is a 65 y.o. female admitted on 10/15/2020 with possible sepsis.  Pharmacy has been consulted for piperacillin/tazobactam dosing. Patient with metastatic GYN malignancy involving the bladder.  On 4/21 had cystoscopy with stent due to external tumor compression. Patient with feves, tachycardia, and tachypnea 4/29  Today, 10/18/2020  SCr 3.89 (trending down)  WBC WNL   Tm 102.5  Urine and blood cultures ordered  UA with pyuria  No previous cultures available in [facility] to guide tx  Plan:  Piperacillin/tazobactam 2.25g IV q6h per current renal function (CrCl < 20ml/min).   Follow cultures and renal function  Height: 4\' 11"  (149.9 cm) Weight: 108.9 kg (240 lb) IBW/kg (Calculated) : 43.2  Temp (24hrs), Avg:100 F (37.8 C), Min:97.5 F (36.4 C), Max:102.5 F (39.2 C)  Recent Labs  Lab 10/15/20 1029 10/15/20 1249 10/15/20 1514 10/15/20 1600 10/16/20 0603 10/17/20 0353 10/18/20 0430  WBC  --  8.2  --   --  9.0 7.4  --   CREATININE 7.87*  --  7.90*  --  6.82* 5.11* 3.89*  LATICACIDVEN  --  1.4  --  1.1  --   --   --     Estimated Creatinine Clearance: 16.2 mL/min (A) (by C-G formula based on SCr of 3.89 mg/dL (H)).    No Known Allergies  Antimicrobials this admission: 4/29 pip/tazo >>  Dose adjustments this admission:  Microbiology results: 4/29 BCx: pending 4/29 UCx:    Thank you for allowing pharmacy to be a part of this patient's care.  Doreene Eland, PharmD, BCPS.   Work Cell: [phone] 10/18/2020 10:50 AM

## 2020-10-18 NOTE — Progress Notes (Addendum)
Secure chat to Dr. Erlinda Hong regarding giving patient Labetolol due to BP of 104/74. Informed to hold Labetolol and give scheduled Lopressor. Secure chat returned to Dr. Erlinda Hong that patient doesn't meet parameters listed on Lopressor due to her BP.   [phone]- ICU charge nurse was at bedside to see patient. ICU charge nurse attempted to place IV without success. Patient with bolus ordered and IV in right hand tender when flushed. Stat IV team consult placed.   1018- Secure chat to MD regarding vitals obtained at this time and increasing MEWS score. Vitals obtained due to Labetolol given earlier and husbands statement that patient felt febrile. Mentioned to MD that med surg may not be appropriate level of care for this patient.   1024 - rapid response called at this time.   1028- O2 placed at 2l/m via [place] for o2 sat of 89%  1109- tele called to report patient HR sustaining in 150s. Dr. Erlinda Hong on unit and notified. Patient was up to [facility] and reports just getting there. Dr. Erlinda Hong requested BP be obtained again and to give scheduled Lopressor if BP ok. BP 82/58.  1122 - lab called critical Lactic of 2.0, lab value sent via secure chat to Dr. Erlinda Hong as well as patients current BP of 95/41

## 2020-10-18 NOTE — Progress Notes (Addendum)
PROGRESS NOTE    Tracy Goodman  ELF:[id number] DOB: Oct 27, 1955 DOA: 10/15/2020 PCP: [name], MD    Chief Complaint  Patient presents with  . Abnormal Lab    Brief Narrative:  Tracy Goodman is a 65 y.o. 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, presents to the emergency department at the request of cancer center for abnormal labs, aki, likely obstructive nephropathy   Subjective:  Spiking fever, tachycardia She denies pain, no confusion ,foley in place 2.5liter urine output last 24hrs  Assessment & Plan:   Principal Problem:   AKI (acute kidney injury) ([place]) Active Problems:   Hyperlipidemia   Hypertension   Morbid obesity ([place])   Enlarged thyroid gland   Umbilical hernia   Breast lump on right side at 10 o'clock position   Bilateral hydronephrosis   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[place])   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[place])   Palliative care encounter  Sepsis -She developed fever on 4/29 AM, with significant sinus tachycardia her heart rate went to 150, hypotension systolic blood pressure in the 80s, with lactic acidosis -Received multiple fluids bolus, blood culture and urine culture obtained, chest x-ray no acute findings -Empiric start Zosyn to cover GU infection although she has no complaints, denies back pain, her most recent cystoscope with stent placement was on 4/21  4/29 7;40 pm addendum:  She continue to spike fever, she has tachypnea and worsening tachycardia when she spike fever, blood pressure stable Will continue hydration, continue abx , transfer to progressive floor for close monitoring Confirmed DNR status with patient and husband at bedside    AKI/metabolic acidosis, (reason for admission) -Baseline creatinine was normal in March 2022 -Suspect AKI due to post obstructive nephropathy -Continue sodium bicarb placement,  she received gentle hydration initially -cr 7.9 on presentation, cr today is 3.89 -Urology and nephrology following, currently have a Foley catheter,  bilateral nephrostomy tube placement on hold due to creatinine improvement  Normocytic anemia No overt sign of bleeding Hemoglobin 10.9 Monitor    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, bladder involvement as well as malignant ascites and omental carcinomatosis  -Management per oncology and palliative care  Right-sided breast mass at the 10 o'clock position-outpatient follow-up  Insomnia- trazodone 25 mg nightly  The patient's BMI is: Body mass index is 48.47 kg/m.Marland Kitchen     Unresulted Labs (From admission, onward)          Start     Ordered   10/19/20 5852  Basic metabolic panel  Tomorrow morning,   R        10/18/20 1153   10/18/20 1715  Lactic acid, plasma  STAT Now then every 3 hours,   TIMED (with STAT occurrences)      10/18/20 1714   10/18/20 0749  Urine Culture  ONCE - STAT,   STAT        10/18/20 0748   10/18/20 0749  CULTURE, BLOOD (ROUTINE X 2) w Reflex to ID Panel  BLOOD CULTURE X 2,   TIMED      10/18/20 0748   10/17/20 7782  Basic metabolic panel  Daily,   R      10/16/20 1727            DVT prophylaxis: Place TED hose Start: 10/15/20 1508   Code Status: Full Family Communication: husband at bedside  Disposition:   Status is:  Inpatient  Dispo: The patient is from: Home              Anticipated d/c is to: Home              Anticipated d/c date is: To be determined, not medically ready to discharge                Consultants:   Urology  Nephrology  Hematology/oncology  Palliative care  Procedures:   Foley placement   Antimicrobials:   Anti-infectives (From admission, onward)   Start     Dose/Rate Route Frequency Ordered Stop   10/18/20 1200  piperacillin-tazobactam (ZOSYN) 2.25 g in sodium chloride 0.9 % 50 mL  IVPB  Status:  Discontinued        2.25 g 100 mL/hr over 30 Minutes Intravenous Every 6 hours 10/18/20 1043 10/18/20 1052   10/18/20 1200  piperacillin-tazobactam (ZOSYN) IVPB 2.25 g        2.25 g 100 mL/hr over 30 Minutes Intravenous Every 6 hours 10/18/20 1052            Objective: Vitals:   10/18/20 1112 10/18/20 1116 10/18/20 1210 10/18/20 1648  BP: (!) 82/58 (!) 95/41 (!) 116/59 102/62  Pulse: (!) 142 (!) 151 (!) 126 (!) 122  Resp:   (!) 24 (!) 28  Temp:   (!) 101.3 F (38.5 C) 100.1 F (37.8 C)  TempSrc:   Oral   SpO2:  99% 100% 99%  Weight:      Height:        Intake/Output Summary (Last 24 hours) at 10/18/2020 1721 Last data filed at 10/18/2020 1607 Gross per 24 hour  Intake 2081.56 ml  Output 2200 ml  Net -118.44 ml   Filed Weights   10/15/20 1156  Weight: 108.9 kg    Examination:  General exam: calm, NAD Respiratory system: Clear to auscultation. Respiratory effort normal. Cardiovascular system: S1 & S2 heard, tachycardia, No pedal edema. Gastrointestinal system: Abdomen is mildly distended, soft and nontender.  Normal bowel sounds heard. Central nervous system: Alert and oriented. No focal neurological deficits. Extremities: Symmetric 5 x 5 power. Skin: No rashes, lesions or ulcers Psychiatry: Judgement and insight appear normal. Mood & affect appropriate.     Data Reviewed: I have personally reviewed following labs and imaging studies  CBC: Recent Labs  Lab 10/15/20 1249 10/16/20 0603 10/17/20 0353  WBC 8.2 9.0 7.4  NEUTROABS 6.2  --  5.1  HGB 11.4* 10.9* 10.2*  HCT 36.6 34.8* 32.6*  MCV 85.7 84.3 84.7  PLT 356 375 376    Basic Metabolic Panel: Recent Labs  Lab 10/15/20 1029 10/15/20 1514 10/15/20 2135 10/16/20 0603 10/17/20 0353 10/18/20 0430  NA 133* 129*  --  135 139 140  K 4.4 4.4  --  4.7 4.5 4.2  CL 101 99  --  103 108 108  CO2 17* 18*  --  19* 21* 20*  GLUCOSE 107* 101*  --  98 104* 121*  BUN 35* 36*  --  36* 31* 26*   CREATININE 7.87* 7.90*  --  6.82* 5.11* 3.89*  CALCIUM 8.7* 8.7*  --  8.6* 8.6* 8.7*  MG  --   --  2.3  --   --   --     GFR: Estimated Creatinine Clearance: 16.2 mL/min (A) (by C-G formula based on SCr of 3.89 mg/dL (H)).  Liver Function Tests: Recent Labs  Lab 10/15/20 1514  AST 18  ALT 10  ALKPHOS 111  BILITOT 0.8  PROT 7.1  ALBUMIN 2.9*    CBG: No results for input(s): GLUCAP in the last 168 hours.   Recent Results (from the past 240 hour(s))  SARS CORONAVIRUS 2 (TAT 6-24 HRS) Nasopharyngeal Nasopharyngeal Swab     Status: None   Collection Time: 10/09/20  7:55 PM   Specimen: Nasopharyngeal Swab  Result Value Ref Range Status   SARS Coronavirus 2 NEGATIVE NEGATIVE Final    Comment: (NOTE) SARS-CoV-2 target nucleic acids are NOT DETECTED.  The SARS-CoV-2 RNA is generally detectable in upper and lower respiratory specimens during the acute phase of infection. Negative results do not preclude SARS-CoV-2 infection, do not rule out co-infections with other pathogens, and should not be used as the sole basis for treatment or other patient management decisions. Negative results must be combined with clinical observations, patient history, and epidemiological information. The expected result is Negative.  Fact Sheet for Patients: SugarRoll.be  Fact Sheet for Healthcare Providers: https://www.woods-mathews.com/  This test is not yet approved or cleared by the [place] FDA and  has been authorized for detection and/or diagnosis of SARS-CoV-2 by FDA under an Emergency Use Authorization (EUA). This EUA will remain  in effect (meaning this test can be used) for the duration of the COVID-19 declaration under Se ction 564(b)(1) of the Act, 21 U.S.C. section 360bbb-3(b)(1), unless the authorization is terminated or revoked sooner.  Performed at [facility], [place] [address], [place], [place] [address]   Resp Panel by  RT-PCR (Flu A&B, Covid) Nasopharyngeal Swab     Status: None   Collection Time: 10/15/20 12:20 PM   Specimen: Nasopharyngeal Swab; Nasopharyngeal(NP) swabs in vial transport medium  Result Value Ref Range Status   SARS Coronavirus 2 by RT PCR NEGATIVE NEGATIVE Final    Comment: (NOTE) SARS-CoV-2 target nucleic acids are NOT DETECTED.  The SARS-CoV-2 RNA is generally detectable in upper respiratory specimens during the acute phase of infection. The lowest concentration of SARS-CoV-2 viral copies this assay can detect is 138 copies/mL. A negative result does not preclude SARS-Cov-2 infection and should not be used as the sole basis for treatment or other patient management decisions. A negative result may occur with  improper specimen collection/handling, submission of specimen other than nasopharyngeal swab, presence of viral mutation(s) within the areas targeted by this assay, and inadequate number of viral copies(<138 copies/mL). A negative result must be combined with clinical observations, patient history, and epidemiological information. The expected result is Negative.  Fact Sheet for Patients:  EntrepreneurPulse.com.au  Fact Sheet for Healthcare Providers:  IncredibleEmployment.be  This test is no t yet approved or cleared by the [place] FDA and  has been authorized for detection and/or diagnosis of SARS-CoV-2 by FDA under an Emergency Use Authorization (EUA). This EUA will remain  in effect (meaning this test can be used) for the duration of the COVID-19 declaration under Section 564(b)(1) of the Act, 21 U.S.C.section 360bbb-3(b)(1), unless the authorization is terminated  or revoked sooner.       Influenza A by PCR NEGATIVE NEGATIVE Final   Influenza B by PCR NEGATIVE NEGATIVE Final    Comment: (NOTE) The Xpert Xpress SARS-CoV-2/FLU/RSV plus assay is intended as an aid in the diagnosis of influenza from Nasopharyngeal swab  specimens and should not be used as a sole basis for treatment. Nasal washings and aspirates are unacceptable for Xpert Xpress SARS-CoV-2/FLU/RSV testing.  Fact Sheet for Patients: EntrepreneurPulse.com.au  Fact Sheet for Healthcare Providers: IncredibleEmployment.be  This test is not yet approved or cleared by the [place] and has been authorized for detection and/or diagnosis of SARS-CoV-2 by FDA under an Emergency Use Authorization (EUA). This EUA will remain in effect (meaning this test can be used) for the duration of the COVID-19 declaration under Section 564(b)(1) of the Act, 21 U.S.C. section 360bbb-3(b)(1), unless the authorization is terminated or revoked.  Performed at [facility], [address] [place], [place] [address]          Radiology Studies: [facility] Chest [place] 1 View  Result Date: 10/18/2020 CLINICAL DATA:  Fever. EXAM: PORTABLE CHEST 1 VIEW COMPARISON:  October 15, 2020. FINDINGS: The heart size and mediastinal contours are within normal limits. Right lung is clear. Minimal left basilar subsegmental atelectasis is noted and decreased compared to prior exam. The visualized skeletal structures are unremarkable. IMPRESSION: Improved left basilar subsegmental atelectasis. Electronically Signed   By: Marijo Conception M.D.   On: 10/18/2020 08:51        Scheduled Meds: . Chlorhexidine Gluconate Cloth  6 each Topical Q0600  . metoprolol tartrate  12.5 mg Oral BID  . multivitamin with minerals  1 tablet Oral Daily  . psyllium  1 packet Oral Daily  . sodium bicarbonate  650 mg Oral TID  . traZODone  25 mg Oral QHS   Continuous Infusions: . lactated ringers    . piperacillin-tazobactam Stopped (10/18/20 1317)     LOS: 2 days   Time spent: 68mins Greater than 50% of this time was spent in counseling, explanation of diagnosis, planning of further management, and coordination of care.   Voice Recognition  Viviann Spare dictation system was used to create this note, attempts have been made to correct errors. Please contact the author with questions and/or clarifications.   Florencia Reasons, MD PhD FACP [facility]  Available via Epic secure chat 7am-7pm for nonurgent issues Please page for urgent issues To page the attending provider between 7A-7P or the covering provider during after hours 7P-7A, please log into the web site [email or web address] and access using universal [place] password for that web site. If you do not have the password, please call the hospital operator.    10/18/2020, 5:21 PM

## 2020-10-18 NOTE — Progress Notes (Signed)
Rapid Response Event Note   Reason for Call : red MEWS   Initial Focused Assessment: Patient resting in bed with primary RN, RT, chaplain, and husband at bedside. This RN had been to see patient previously in shift for high HR and tachypnea, however HR had decreased with IV labetalol and order had been given by MD for LR bolus. Patient again with HR in 130s-140s and febrile. Patient with no complaints, states that she feels fine, A/O x 4. Dr. Erlinda Hong to bedside to see patient.   Interventions: Additional fluid bolus ordered, order placed to move patient to PCU   Plan of Care: Move patient to PCU    Event Summary: Patient to remain on 2C until bed on PCU becomes available, primary RN and charge RN on [place] understand that they can contact me with any questions or concerns.   MD Notified: Dr. Erlinda Hong Call Time: [place] Time: 8299 End Time: 3716  Mila Merry, RN

## 2020-10-18 NOTE — Progress Notes (Signed)
PHARMACY - PHYSICIAN COMMUNICATION CRITICAL VALUE ALERT - BLOOD CULTURE IDENTIFICATION (BCID)  Tracy Goodman is an 64 y.o. female who presented to [facility] on 10/15/2020 with a chief complaint of AKI  Assessment:  Pseudomonas growing in 2 of 4 bottles, no resistance detected.  (include suspected source if known)  Name of physician (or Provider) Contacted: Prudy Feeler   Current antibiotics:  Zosyn 2.25 gm IV Q6H.   Changes to prescribed antibiotics recommended:  Patient is on recommended antibiotics - No changes needed  Results for orders placed or performed during the hospital encounter of 10/15/20  Blood Culture ID Panel (Reflexed) (Collected: 10/18/2020  8:08 AM)  Result Value Ref Range   Enterococcus faecalis NOT DETECTED NOT DETECTED   Enterococcus Faecium NOT DETECTED NOT DETECTED   Listeria monocytogenes NOT DETECTED NOT DETECTED   Staphylococcus species NOT DETECTED NOT DETECTED   Staphylococcus aureus (BCID) NOT DETECTED NOT DETECTED   Staphylococcus epidermidis NOT DETECTED NOT DETECTED   Staphylococcus lugdunensis NOT DETECTED NOT DETECTED   Streptococcus species NOT DETECTED NOT DETECTED   Streptococcus agalactiae NOT DETECTED NOT DETECTED   Streptococcus pneumoniae NOT DETECTED NOT DETECTED   Streptococcus pyogenes NOT DETECTED NOT DETECTED   A.calcoaceticus-baumannii NOT DETECTED NOT DETECTED   Bacteroides fragilis NOT DETECTED NOT DETECTED   Enterobacterales NOT DETECTED NOT DETECTED   Enterobacter cloacae complex NOT DETECTED NOT DETECTED   Escherichia coli NOT DETECTED NOT DETECTED   Klebsiella aerogenes NOT DETECTED NOT DETECTED   Klebsiella oxytoca NOT DETECTED NOT DETECTED   Klebsiella pneumoniae NOT DETECTED NOT DETECTED   Proteus species NOT DETECTED NOT DETECTED   Salmonella species NOT DETECTED NOT DETECTED   Serratia marcescens NOT DETECTED NOT DETECTED   Haemophilus influenzae NOT DETECTED NOT DETECTED   Neisseria meningitidis NOT DETECTED NOT  DETECTED   Pseudomonas aeruginosa DETECTED (A) NOT DETECTED   Stenotrophomonas maltophilia NOT DETECTED NOT DETECTED   Candida albicans NOT DETECTED NOT DETECTED   Candida auris NOT DETECTED NOT DETECTED   Candida glabrata NOT DETECTED NOT DETECTED   Candida krusei NOT DETECTED NOT DETECTED   Candida parapsilosis NOT DETECTED NOT DETECTED   Candida tropicalis NOT DETECTED NOT DETECTED   Cryptococcus neoformans/gattii NOT DETECTED NOT DETECTED   CTX-M ESBL NOT DETECTED NOT DETECTED   Carbapenem resistance IMP NOT DETECTED NOT DETECTED   Carbapenem resistance KPC NOT DETECTED NOT DETECTED   Carbapenem resistance NDM NOT DETECTED NOT DETECTED   Carbapenem resistance VIM NOT DETECTED NOT DETECTED    Rogers Ditter D 10/18/2020  11:16 PM

## 2020-10-18 NOTE — Progress Notes (Signed)
Urology Inpatient Progress Note  Subjective: Tachycardic, febrile overnight.  CXR, UA, and urine and blood cultures ordered. Creatinine down today, 3.89. Foley catheter in place draining clear, yellow urine. Patient denies pain today.  Anti-infectives: Anti-infectives (From admission, onward)   None      Current Facility-Administered Medications  Medication Dose Route Frequency Provider Last Rate Last Admin  . acetaminophen (TYLENOL) tablet 650 mg  650 mg Oral Q6H PRN Cox, Amy N, DO   650 mg at 10/18/20 0426   Or  . acetaminophen (TYLENOL) suppository 650 mg  650 mg Rectal Q6H PRN Cox, Amy N, DO      . Chlorhexidine Gluconate Cloth 2 % PADS 6 each  6 each Topical Q0600 Florencia Reasons, MD   6 each at 10/17/20 1004  . labetalol (NORMODYNE) injection 10 mg  10 mg Intravenous Q4H PRN Sharion Settler, NP   10 mg at 10/18/20 0427  . metoprolol tartrate (LOPRESSOR) tablet 12.5 mg  12.5 mg Oral BID Florencia Reasons, MD      . multivitamin with minerals tablet 1 tablet  1 tablet Oral Daily Cox, Amy N, DO   1 tablet at 10/17/20 1002  . ondansetron (ZOFRAN) tablet 4 mg  4 mg Oral Q6H PRN Cox, Amy N, DO       Or  . ondansetron (ZOFRAN) injection 4 mg  4 mg Intravenous Q6H PRN Cox, Amy N, DO   4 mg at 10/18/20 0641  . psyllium (HYDROCIL/METAMUCIL) 1 packet  1 packet Oral Daily Florencia Reasons, MD   1 packet at 10/17/20 1003  . traZODone (DESYREL) tablet 25 mg  25 mg Oral QHS Sharion Settler, NP   25 mg at 10/17/20 2257   Objective: Vital signs in last 24 hours: Temp:  [97.5 F (36.4 C)-102.5 F (39.2 C)] 98.6 F (37 C) (04/29 0823) Pulse Rate:  [110-144] 136 (04/29 0823) Resp:  [16-28] 28 (04/29 0823) BP: (104-152)/(52-82) 104/74 (04/29 0823) SpO2:  [92 %-100 %] 98 % (04/29 0823)  Intake/Output from previous day: 04/28 0701 - 04/29 0700 In: 644.2 [P.O.:620; IV Piggyback:24.2] Out: 2200 [Urine:2200] Intake/Output this shift: No intake/output data recorded.  Physical Exam Vitals and nursing note  reviewed.  Constitutional:      General: She is not in acute distress.    Appearance: She is not ill-appearing, toxic-appearing or diaphoretic.  HENT:     Head: Normocephalic and atraumatic.  Pulmonary:     Effort: Pulmonary effort is normal. No respiratory distress.  Skin:    General: Skin is warm and dry.  Neurological:     Mental Status: She is alert and oriented to person, place, and time.  Psychiatric:        Mood and Affect: Mood normal.        Behavior: Behavior normal.    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  Creatinine continues to downtrend in the setting of Foley catheter placement, however patient has now developed fever of unknown origin.  Okay to advance diet for today.  Agree with further testing to determine fever origin.  If UA appears infected, agree with empiric antibiotics.  Foley catheter should remain in place in the setting of urinary infection for maximal urinary decompression and source control.  Ultimately, given patient's preference to avoid nephrostomy tubes, may consider trial without catheter over the weekend with close monitoring of creatinine and low threshold to replace Foley if creatinine worsens.  Patient is in agreement with this plan.  Debroah Loop, PA-C 10/18/2020

## 2020-10-18 NOTE — Progress Notes (Signed)
   10/18/20 0413  Assess: MEWS Score  Temp (!) 100.8 F (38.2 C)  BP 133/79  Pulse Rate (!) 143 (RN at bedside*)  Resp 16  SpO2 95 %  Assess: MEWS Score  MEWS Temp 1  MEWS Systolic 0  MEWS Pulse 3  MEWS RR 0  MEWS LOC 0  MEWS Score 4  MEWS Score Color Red  Assess: if the MEWS score is Yellow or Red  Were vital signs taken at a resting state? Yes  Focused Assessment No change from prior assessment  Early Detection of Sepsis Score *See Row Information* Low  MEWS guidelines implemented *See Row Information* Yes  Treat  MEWS Interventions Administered prn meds/treatments  Pain Scale 0-10  Pain Score 0  Notify: Provider  Provider Name/Title Dr. Myna Hidalgo  Date Provider Notified 10/18/20  Time Provider Notified [phone]  Notification Type Page  Notification Reason Change in status (Pt is a red mews)  Provider response No new orders (MD reviewing chart; orders to notify if interventions do not work)  Date of Provider Response 10/18/20  Time of Provider Response 0435  Notify: Rapid Response  Name of Rapid Response RN Notified Alver Fisher, RN  Date Rapid Response Notified  (10/18/20)  Time Rapid Response Notified 0415  Document  Patient Outcome  (continuing to monitor)

## 2020-10-18 NOTE — Progress Notes (Signed)
[facility]  ROUNDING NOTE   Subjective:   Tracy Goodman is a 65 y.o. 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   Patient seen resting in bed Alert and oriented Tolerating meals and denies nausea  Elevated heart rate, but asymptomatic  Objective:  Vital signs in last 24 hours:  Temp:  [98.4 F (36.9 C)-102.5 F (39.2 C)] 101.8 F (38.8 C) (04/29 1016) Pulse Rate:  [110-151] 151 (04/29 1116) Resp:  [16-30] 30 (04/29 1016) BP: (82-152)/(41-82) 95/41 (04/29 1116) SpO2:  [92 %-100 %] 99 % (04/29 1116)  Weight change:  Filed Weights   10/15/20 1156  Weight: 108.9 kg    Intake/Output: I/O last 3 completed shifts: In: 762.2 [P.O.:738; IV Piggyback:24.2] Out: 4200 [Urine:4200]   Intake/Output this shift:  No intake/output data recorded.  Physical Exam: General: NAD  Head: Normocephalic, atraumatic. Moist oral mucosal membranes  Eyes: Anicteric  Lungs:  Clear to auscultation, tachypnea  Heart: Tachycardia   Abdomen:  Soft, nontender,   Extremities:  no peripheral edema.  Neurologic: Alert, moving all four extremities  Skin: No lesions       Basic Metabolic Panel: Recent Labs  Lab 10/15/20 1029 10/15/20 1514 10/15/20 2135 10/16/20 0603 10/17/20 0353 10/18/20 0430  NA 133* 129*  --  135 139 140  K 4.4 4.4  --  4.7 4.5 4.2  CL 101 99  --  103 108 108  CO2 17* 18*  --  19* 21* 20*  GLUCOSE 107* 101*  --  98 104* 121*  BUN 35* 36*  --  36* 31* 26*  CREATININE 7.87* 7.90*  --  6.82* 5.11* 3.89*  CALCIUM 8.7* 8.7*  --  8.6* 8.6* 8.7*  MG  --   --  2.3  --   --   --     Liver Function Tests: Recent Labs  Lab 10/15/20 1514  AST 18  ALT 10  ALKPHOS 111  BILITOT 0.8  PROT 7.1  ALBUMIN 2.9*   No results for input(s): LIPASE, AMYLASE in the last 168 hours. No results for input(s): AMMONIA in the last 168 hours.  CBC: Recent Labs  Lab 10/15/20 1249 10/16/20 0603  10/17/20 0353  WBC 8.2 9.0 7.4  NEUTROABS 6.2  --  5.1  HGB 11.4* 10.9* 10.2*  HCT 36.6 34.8* 32.6*  MCV 85.7 84.3 84.7  PLT 356 375 347    Cardiac Enzymes: No results for input(s): CKTOTAL, CKMB, CKMBINDEX, TROPONINI in the last 168 hours.  BNP: Invalid input(s): POCBNP  CBG: No results for input(s): GLUCAP in the last 168 hours.  Microbiology: Results for orders placed or performed during the hospital encounter of 10/15/20  Resp Panel by RT-PCR (Flu A&B, Covid) Nasopharyngeal Swab     Status: None   Collection Time: 10/15/20 12:20 PM   Specimen: Nasopharyngeal Swab; Nasopharyngeal(NP) swabs in vial transport medium  Result Value Ref Range Status   SARS Coronavirus 2 by RT PCR NEGATIVE NEGATIVE Final    Comment: (NOTE) SARS-CoV-2 target nucleic acids are NOT DETECTED.  The SARS-CoV-2 RNA is generally detectable in upper respiratory specimens during the acute phase of infection. The lowest concentration of SARS-CoV-2 viral copies this assay can detect is 138 copies/mL. A negative result does not preclude SARS-Cov-2 infection and should not be used as the sole basis for treatment or other patient management decisions. A negative result may occur with  improper specimen collection/handling, submission of specimen other than  nasopharyngeal swab, presence of viral mutation(s) within the areas targeted by this assay, and inadequate number of viral copies(<138 copies/mL). A negative result must be combined with clinical observations, patient history, and epidemiological information. The expected result is Negative.  Fact Sheet for Patients:  EntrepreneurPulse.com.au  Fact Sheet for Healthcare Providers:  IncredibleEmployment.be  This test is no t yet approved or cleared by the [place] FDA and  has been authorized for detection and/or diagnosis of SARS-CoV-2 by FDA under an Emergency Use Authorization (EUA). This EUA will remain  in  effect (meaning this test can be used) for the duration of the COVID-19 declaration under Section 564(b)(1) of the Act, 21 U.S.C.section 360bbb-3(b)(1), unless the authorization is terminated  or revoked sooner.       Influenza A by PCR NEGATIVE NEGATIVE Final   Influenza B by PCR NEGATIVE NEGATIVE Final    Comment: (NOTE) The Xpert Xpress SARS-CoV-2/FLU/RSV plus assay is intended as an aid in the diagnosis of influenza from Nasopharyngeal swab specimens and should not be used as a sole basis for treatment. Nasal washings and aspirates are unacceptable for Xpert Xpress SARS-CoV-2/FLU/RSV testing.  Fact Sheet for Patients: EntrepreneurPulse.com.au  Fact Sheet for Healthcare Providers: IncredibleEmployment.be  This test is not yet approved or cleared by the [place] FDA and has been authorized for detection and/or diagnosis of SARS-CoV-2 by FDA under an Emergency Use Authorization (EUA). This EUA will remain in effect (meaning this test can be used) for the duration of the COVID-19 declaration under Section 564(b)(1) of the Act, 21 U.S.C. section 360bbb-3(b)(1), unless the authorization is terminated or revoked.  Performed at [facility], [address] [place], [place] [address]     Coagulation Studies: No results for input(s): LABPROT, INR in the last 72 hours.  Urinalysis: Recent Labs    10/15/20 1630 10/18/20 0838  COLORURINE YELLOW* YELLOW*  LABSPEC 1.004* 1.012  PHURINE 5.0 6.0  GLUCOSEU NEGATIVE NEGATIVE  HGBUR LARGE* LARGE*  BILIRUBINUR NEGATIVE NEGATIVE  KETONESUR NEGATIVE NEGATIVE  PROTEINUR 30* 30*  NITRITE NEGATIVE NEGATIVE  LEUKOCYTESUR LARGE* LARGE*      Imaging: DG Chest Port 1 View  Result Date: 10/18/2020 CLINICAL DATA:  Fever. EXAM: PORTABLE CHEST 1 VIEW COMPARISON:  October 15, 2020. FINDINGS: The heart size and mediastinal contours are within normal limits. Right lung is clear. Minimal left  basilar subsegmental atelectasis is noted and decreased compared to prior exam. The visualized skeletal structures are unremarkable. IMPRESSION: Improved left basilar subsegmental atelectasis. Electronically Signed   By: Marijo Conception M.D.   On: 10/18/2020 08:51     Medications:   . piperacillin-tazobactam     . Chlorhexidine Gluconate Cloth  6 each Topical Q0600  . metoprolol tartrate  12.5 mg Oral BID  . multivitamin with minerals  1 tablet Oral Daily  . psyllium  1 packet Oral Daily  . traZODone  25 mg Oral QHS   acetaminophen **OR** acetaminophen, labetalol, ondansetron **OR** ondansetron (ZOFRAN) IV  Assessment/ Plan:  Ms. KATHIA Goodman is a 65 y.o.  female   Acute kidney injury #Bilateral hydronephrosis status post stent placement 10/10/2020 Baseline creatinine 0.90 from September 04, 2020 Differential includes failed urological stents due to tumor compression causing bilateral hydronephrosis versus component of ATN from intravascular volume depletion Urological evaluation is ongoing Bilateral nephrostomy tubes are being considered on daily basis based on Creatinine Creatinine continues to improve Urine output adequate   Lab Results  Component Value Date   CREATININE 3.89 (H)  10/18/2020   CREATININE 5.11 (H) 10/17/2020   CREATININE 6.82 (H) 10/16/2020    Intake/Output Summary (Last 24 hours) at 10/18/2020 1158 Last data filed at 10/18/2020 0655 Gross per 24 hour  Intake 404.16 ml  Output 2200 ml  Net -1795.84 ml    #Hypertension D/c amlodipine Decreased blood pressure this am.  Elevated heart rate, IV Labetalol given Avoid ACE inhibitor/ARB at the moment until renal function stabilizes    LOS: 2 Kelii Chittum 4/29/202211:58 AM

## 2020-10-18 NOTE — Progress Notes (Signed)
Cross-coverage note:   Patient tachycardic in 140s. Appears to be sinus. Improved briefly with treatment of fever but now back up. She is net negative 1.5 liters, will give 500 cc NS.

## 2020-10-19 ENCOUNTER — Inpatient Hospital Stay: Payer: BC Managed Care – PPO

## 2020-10-19 DIAGNOSIS — C569 Malignant neoplasm of unspecified ovary: Secondary | ICD-10-CM

## 2020-10-19 DIAGNOSIS — N138 Other obstructive and reflux uropathy: Secondary | ICD-10-CM

## 2020-10-19 DIAGNOSIS — B965 Pseudomonas (aeruginosa) (mallei) (pseudomallei) as the cause of diseases classified elsewhere: Secondary | ICD-10-CM

## 2020-10-19 DIAGNOSIS — A419 Sepsis, unspecified organism: Secondary | ICD-10-CM

## 2020-10-19 DIAGNOSIS — N179 Acute kidney failure, unspecified: Secondary | ICD-10-CM

## 2020-10-19 DIAGNOSIS — C786 Secondary malignant neoplasm of retroperitoneum and peritoneum: Secondary | ICD-10-CM

## 2020-10-19 DIAGNOSIS — N390 Urinary tract infection, site not specified: Secondary | ICD-10-CM

## 2020-10-19 LAB — CBC WITH DIFFERENTIAL/PLATELET
Abs Immature Granulocytes: 0.14 K/uL — ABNORMAL HIGH (ref 0.00–0.07)
Basophils Absolute: 0 K/uL (ref 0.0–0.1)
Basophils Relative: 0 %
Eosinophils Absolute: 0 K/uL (ref 0.0–0.5)
Eosinophils Relative: 0 %
HCT: 28.9 % — ABNORMAL LOW (ref 36.0–46.0)
Hemoglobin: 9 g/dL — ABNORMAL LOW (ref 12.0–15.0)
Immature Granulocytes: 1 %
Lymphocytes Relative: 6 %
Lymphs Abs: 1.1 K/uL (ref 0.7–4.0)
MCH: 26.9 pg (ref 26.0–34.0)
MCHC: 31.1 g/dL (ref 30.0–36.0)
MCV: 86.3 fL (ref 80.0–100.0)
Monocytes Absolute: 1.1 K/uL — ABNORMAL HIGH (ref 0.1–1.0)
Monocytes Relative: 7 %
Neutro Abs: 14.6 K/uL — ABNORMAL HIGH (ref 1.7–7.7)
Neutrophils Relative %: 86 %
Platelets: 187 K/uL (ref 150–400)
RBC: 3.35 MIL/uL — ABNORMAL LOW (ref 3.87–5.11)
RDW: 13.6 % (ref 11.5–15.5)
WBC: 17.1 K/uL — ABNORMAL HIGH (ref 4.0–10.5)
nRBC: 0 % (ref 0.0–0.2)

## 2020-10-19 LAB — COMPREHENSIVE METABOLIC PANEL WITH GFR
ALT: 20 U/L (ref 0–44)
AST: 41 U/L (ref 15–41)
Albumin: 2.3 g/dL — ABNORMAL LOW (ref 3.5–5.0)
Alkaline Phosphatase: 118 U/L (ref 38–126)
Anion gap: 8 (ref 5–15)
BUN: 26 mg/dL — ABNORMAL HIGH (ref 8–23)
CO2: 23 mmol/L (ref 22–32)
Calcium: 8 mg/dL — ABNORMAL LOW (ref 8.9–10.3)
Chloride: 107 mmol/L (ref 98–111)
Creatinine, Ser: 3.47 mg/dL — ABNORMAL HIGH (ref 0.44–1.00)
GFR, Estimated: 14 mL/min — ABNORMAL LOW
Glucose, Bld: 107 mg/dL — ABNORMAL HIGH (ref 70–99)
Potassium: 5 mmol/L (ref 3.5–5.1)
Sodium: 138 mmol/L (ref 135–145)
Total Bilirubin: 0.6 mg/dL (ref 0.3–1.2)
Total Protein: 6 g/dL — ABNORMAL LOW (ref 6.5–8.1)

## 2020-10-19 LAB — BASIC METABOLIC PANEL WITH GFR
Anion gap: 8 (ref 5–15)
BUN: 27 mg/dL — ABNORMAL HIGH (ref 8–23)
CO2: 25 mmol/L (ref 22–32)
Calcium: 8 mg/dL — ABNORMAL LOW (ref 8.9–10.3)
Chloride: 105 mmol/L (ref 98–111)
Creatinine, Ser: 3.38 mg/dL — ABNORMAL HIGH (ref 0.44–1.00)
GFR, Estimated: 15 mL/min — ABNORMAL LOW
Glucose, Bld: 121 mg/dL — ABNORMAL HIGH (ref 70–99)
Potassium: 4.6 mmol/L (ref 3.5–5.1)
Sodium: 138 mmol/L (ref 135–145)

## 2020-10-19 LAB — LACTIC ACID, PLASMA: Lactic Acid, Venous: 0.8 mmol/L (ref 0.5–1.9)

## 2020-10-19 MED ORDER — SODIUM CHLORIDE 0.9 % IV SOLN
2.2500 g | Freq: Four times a day (QID) | INTRAVENOUS | Status: DC
  Administered 2020-10-19 – 2020-10-20 (×3): 2.25 g via INTRAVENOUS
  Filled 2020-10-19: qty 2.25
  Filled 2020-10-19 (×4): qty 10

## 2020-10-19 MED ORDER — PIPERACILLIN-TAZOBACTAM IN DEX 2-0.25 GM/50ML IV SOLN: 2.2500 g | Freq: Four times a day (QID) | INTRAVENOUS | Status: DC

## 2020-10-19 NOTE — Progress Notes (Signed)
PROGRESS NOTE    Tracy Goodman  VWU:[id number] DOB: 12/15/1955 DOA: 10/15/2020 PCP: [name], MD    Chief Complaint  Patient presents with  . Abnormal Lab    Brief Narrative:  Tracy Goodman is a 65 y.o. 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, presents to the emergency department at the request of cancer center for abnormal labs, aki, likely obstructive nephropathy   Subjective:  She is feeling better, blood culture positive for Pseudomonas She denies pain, no confusion ,foley in place   Assessment & Plan:   Principal Problem:   AKI (acute kidney injury) ([place]) Active Problems:   Hyperlipidemia   Hypertension   Morbid obesity ([place])   Enlarged thyroid gland   Umbilical hernia   Breast lump on right side at 10 o'clock position   Bilateral hydronephrosis   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[place])   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[place])   Palliative care encounter  Sepsis/Pseudomonas UTI/Pseudomonas bacteremia -recent bilateral stent placement on 4/21 for bilateral hydronephrosis from cancer obstruction -She developed fever on 4/29 AM, with significant sinus tachycardia her heart rate went to 150, hypotension systolic blood pressure in the 80s, with lactic acidosis -Received multiple fluids bolus, blood culture and urine culture grew Pseudomonas, chest x-ray no acute findings -Empiric started on  Zosyn on 4/29, continue, follow final culture result   AKI/metabolic acidosis, (reason for admission) -Baseline creatinine was normal in March 2022 -Suspect AKI due to post obstructive nephropathy -Continue sodium bicarb placement, hydration  -cr 7.9 on presentation, cr today is 3.38 -Urology and nephrology following, currently have a Foley catheter,  bilateral nephrostomy tube placement on hold due to creatinine improvement  Normocytic anemia No  overt sign of bleeding Hemoglobin 10.9 Monitor    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, bladder involvement as well as malignant ascites and omental carcinomatosis  -Management per oncology and palliative care  Bilateral hydronephrosis /obstructive nephropathy status post bilateral stent placement on 4/21  Right-sided breast mass at the 10 o'clock position-outpatient follow-up  Insomnia- trazodone 25 mg nightly  The patient's BMI is: Body mass index is 48.47 kg/m.Marland Kitchen     Unresulted Labs (From admission, onward)          Start     Ordered   10/20/20 2956  Basic metabolic panel  Tomorrow morning,   R       Question:  Specimen collection method  Answer:  Lab=Lab collect   10/19/20 0742            DVT prophylaxis: Place TED hose Start: 10/15/20 1508   Code Status: Full Family Communication: husband at bedside on 4/29 Disposition:   Status is: Inpatient  Dispo: The patient is from: Home              Anticipated d/c is to: Home              Anticipated d/c date is: To be determined, not medically ready to discharge                Consultants:   Urology  Nephrology  Hematology/oncology  Palliative care  Procedures:   Foley placement   Antimicrobials:   Anti-infectives (From admission, onward)   Start     Dose/Rate Route Frequency Ordered Stop   10/19/20 1800  piperacillin-tazobactam (ZOSYN) IVPB 2.25 g  Status:  Discontinued  2.25 g 100 mL/hr over 30 Minutes Intravenous Every 6 hours 10/19/20 1230 10/19/20 1231   10/19/20 1800  piperacillin-tazobactam (ZOSYN) 2.25 g in sodium chloride 0.9 % 50 mL IVPB        2.25 g 100 mL/hr over 30 Minutes Intravenous Every 6 hours 10/19/20 1232     10/18/20 1200  piperacillin-tazobactam (ZOSYN) 2.25 g in sodium chloride 0.9 % 50 mL IVPB  Status:  Discontinued        2.25 g 100 mL/hr over 30 Minutes Intravenous Every 6 hours 10/18/20  1043 10/18/20 1052   10/18/20 1200  piperacillin-tazobactam (ZOSYN) IVPB 2.25 g  Status:  Discontinued        2.25 g 100 mL/hr over 30 Minutes Intravenous Every 6 hours 10/18/20 1052 10/19/20 1232          Objective: Vitals:   10/19/20 0549 10/19/20 0828 10/19/20 1203 10/19/20 1540  BP: 91/71 104/69 102/70   Pulse: (!) 124 (!) 134 (!) 110   Resp: 19 18 18    Temp: 98.8 F (37.1 C) 98.9 F (37.2 C) 97.9 F (36.6 C)   TempSrc: Oral Oral    SpO2: 99% 98% 99% 96%  Weight:      Height:        Intake/Output Summary (Last 24 hours) at 10/19/2020 1753 Last data filed at 10/19/2020 1606 Gross per 24 hour  Intake 2458.84 ml  Output 1700 ml  Net 758.84 ml   Filed Weights   10/15/20 1156  Weight: 108.9 kg    Examination:  General exam: calm, NAD Respiratory system: Clear to auscultation. Respiratory effort normal. Cardiovascular system: S1 & S2 heard, tachycardia has much improved, No pedal edema. Gastrointestinal system: Abdomen is mildly distended, soft and nontender.  Normal bowel sounds heard. Central nervous system: Alert and oriented. No focal neurological deficits. Extremities: Symmetric 5 x 5 power. Skin: No rashes, lesions or ulcers Psychiatry: Judgement and insight appear normal. Mood & affect appropriate.     Data Reviewed: I have personally reviewed following labs and imaging studies  CBC: Recent Labs  Lab 10/15/20 1249 10/16/20 0603 10/17/20 0353 10/19/20 0520  WBC 8.2 9.0 7.4 17.1*  NEUTROABS 6.2  --  5.1 14.6*  HGB 11.4* 10.9* 10.2* 9.0*  HCT 36.6 34.8* 32.6* 28.9*  MCV 85.7 84.3 84.7 86.3  PLT 356 375 347 540    Basic Metabolic Panel: Recent Labs  Lab 10/15/20 2135 10/16/20 0603 10/17/20 0353 10/18/20 0430 10/19/20 0520 10/19/20 1447  NA  --  135 139 140 138 138  K  --  4.7 4.5 4.2 5.0 4.6  CL  --  103 108 108 107 105  CO2  --  19* 21* 20* 23 25  GLUCOSE  --  98 104* 121* 107* 121*  BUN  --  36* 31* 26* 26* 27*  CREATININE  --   6.82* 5.11* 3.89* 3.47* 3.38*  CALCIUM  --  8.6* 8.6* 8.7* 8.0* 8.0*  MG 2.3  --   --   --   --   --     GFR: Estimated Creatinine Clearance: 18.7 mL/min (A) (by C-G formula based on SCr of 3.38 mg/dL (H)).  Liver Function Tests: Recent Labs  Lab 10/15/20 1514 10/19/20 0520  AST 18 41  ALT 10 20  ALKPHOS 111 118  BILITOT 0.8 0.6  PROT 7.1 6.0*  ALBUMIN 2.9* 2.3*    CBG: No results for input(s): GLUCAP in the last 168 hours.   Recent Results (from the  past 240 hour(s))  SARS CORONAVIRUS 2 (TAT 6-24 HRS) Nasopharyngeal Nasopharyngeal Swab     Status: None   Collection Time: 10/09/20  7:55 PM   Specimen: Nasopharyngeal Swab  Result Value Ref Range Status   SARS Coronavirus 2 NEGATIVE NEGATIVE Final    Comment: (NOTE) SARS-CoV-2 target nucleic acids are NOT DETECTED.  The SARS-CoV-2 RNA is generally detectable in upper and lower respiratory specimens during the acute phase of infection. Negative results do not preclude SARS-CoV-2 infection, do not rule out co-infections with other pathogens, and should not be used as the sole basis for treatment or other patient management decisions. Negative results must be combined with clinical observations, patient history, and epidemiological information. The expected result is Negative.  Fact Sheet for Patients: SugarRoll.be  Fact Sheet for Healthcare Providers: https://www.woods-mathews.com/  This test is not yet approved or cleared by the [place] FDA and  has been authorized for detection and/or diagnosis of SARS-CoV-2 by FDA under an Emergency Use Authorization (EUA). This EUA will remain  in effect (meaning this test can be used) for the duration of the COVID-19 declaration under Se ction 564(b)(1) of the Act, 21 U.S.C. section 360bbb-3(b)(1), unless the authorization is terminated or revoked sooner.  Performed at [facility], [place] [address], [place],  [place] [address]   Resp Panel by RT-PCR (Flu A&B, Covid) Nasopharyngeal Swab     Status: None   Collection Time: 10/15/20 12:20 PM   Specimen: Nasopharyngeal Swab; Nasopharyngeal(NP) swabs in vial transport medium  Result Value Ref Range Status   SARS Coronavirus 2 by RT PCR NEGATIVE NEGATIVE Final    Comment: (NOTE) SARS-CoV-2 target nucleic acids are NOT DETECTED.  The SARS-CoV-2 RNA is generally detectable in upper respiratory specimens during the acute phase of infection. The lowest concentration of SARS-CoV-2 viral copies this assay can detect is 138 copies/mL. A negative result does not preclude SARS-Cov-2 infection and should not be used as the sole basis for treatment or other patient management decisions. A negative result may occur with  improper specimen collection/handling, submission of specimen other than nasopharyngeal swab, presence of viral mutation(s) within the areas targeted by this assay, and inadequate number of viral copies(<138 copies/mL). A negative result must be combined with clinical observations, patient history, and epidemiological information. The expected result is Negative.  Fact Sheet for Patients:  EntrepreneurPulse.com.au  Fact Sheet for Healthcare Providers:  IncredibleEmployment.be  This test is no t yet approved or cleared by the [place] FDA and  has been authorized for detection and/or diagnosis of SARS-CoV-2 by FDA under an Emergency Use Authorization (EUA). This EUA will remain  in effect (meaning this test can be used) for the duration of the COVID-19 declaration under Section 564(b)(1) of the Act, 21 U.S.C.section 360bbb-3(b)(1), unless the authorization is terminated  or revoked sooner.       Influenza A by PCR NEGATIVE NEGATIVE Final   Influenza B by PCR NEGATIVE NEGATIVE Final    Comment: (NOTE) The Xpert Xpress SARS-CoV-2/FLU/RSV plus assay is intended as an aid in the diagnosis of influenza  from Nasopharyngeal swab specimens and should not be used as a sole basis for treatment. Nasal washings and aspirates are unacceptable for Xpert Xpress SARS-CoV-2/FLU/RSV testing.  Fact Sheet for Patients: EntrepreneurPulse.com.au  Fact Sheet for Healthcare Providers: IncredibleEmployment.be  This test is not yet approved or cleared by the [place] FDA and has been authorized for detection and/or diagnosis of SARS-CoV-2 by FDA under an Emergency Use Authorization (  EUA). This EUA will remain in effect (meaning this test can be used) for the duration of the COVID-19 declaration under Section 564(b)(1) of the Act, 21 U.S.C. section 360bbb-3(b)(1), unless the authorization is terminated or revoked.  Performed at [facility], [address] [place], [place] [address]   CULTURE, BLOOD (ROUTINE X 2) w Reflex to ID Panel     Status: None (Preliminary result)   Collection Time: 10/18/20  8:08 AM   Specimen: BLOOD  Result Value Ref Range Status   Specimen Description BLOOD BLOOD LEFT HAND  Final   Special Requests   Final    BOTTLES DRAWN AEROBIC AND ANAEROBIC Blood Culture adequate volume   Culture  Setup Time   Final    Organism ID to follow GRAM NEGATIVE RODS AEROBIC BOTTLE ONLY CRITICAL RESULT CALLED TO, READ BACK BY AND VERIFIED WITH: JASON ROBBINS AT 2246 10/18/20 MF Performed at [facility], [address] [place], [place] [address]    Culture GRAM NEGATIVE RODS  Final   Report Status PENDING  Incomplete  Blood Culture ID Panel (Reflexed)     Status: Abnormal   Collection Time: 10/18/20  8:08 AM  Result Value Ref Range Status   Enterococcus faecalis NOT DETECTED NOT DETECTED Final   Enterococcus Faecium NOT DETECTED NOT DETECTED Final   Listeria monocytogenes NOT DETECTED NOT DETECTED Final   Staphylococcus species NOT DETECTED NOT DETECTED Final   Staphylococcus aureus (BCID) NOT DETECTED NOT DETECTED Final    Staphylococcus epidermidis NOT DETECTED NOT DETECTED Final   Staphylococcus lugdunensis NOT DETECTED NOT DETECTED Final   Streptococcus species NOT DETECTED NOT DETECTED Final   Streptococcus agalactiae NOT DETECTED NOT DETECTED Final   Streptococcus pneumoniae NOT DETECTED NOT DETECTED Final   Streptococcus pyogenes NOT DETECTED NOT DETECTED Final   A.calcoaceticus-baumannii NOT DETECTED NOT DETECTED Final   Bacteroides fragilis NOT DETECTED NOT DETECTED Final   Enterobacterales NOT DETECTED NOT DETECTED Final   Enterobacter cloacae complex NOT DETECTED NOT DETECTED Final   Escherichia coli NOT DETECTED NOT DETECTED Final   Klebsiella aerogenes NOT DETECTED NOT DETECTED Final   Klebsiella oxytoca NOT DETECTED NOT DETECTED Final   Klebsiella pneumoniae NOT DETECTED NOT DETECTED Final   Proteus species NOT DETECTED NOT DETECTED Final   Salmonella species NOT DETECTED NOT DETECTED Final   Serratia marcescens NOT DETECTED NOT DETECTED Final   Haemophilus influenzae NOT DETECTED NOT DETECTED Final   Neisseria meningitidis NOT DETECTED NOT DETECTED Final   Pseudomonas aeruginosa DETECTED (A) NOT DETECTED Final    Comment: CRITICAL RESULT CALLED TO, READ BACK BY AND VERIFIED WITH: JASON ROBBINS AT 2248 10/18/20 MF    Stenotrophomonas maltophilia NOT DETECTED NOT DETECTED Final   Candida albicans NOT DETECTED NOT DETECTED Final   Candida auris NOT DETECTED NOT DETECTED Final   Candida glabrata NOT DETECTED NOT DETECTED Final   Candida krusei NOT DETECTED NOT DETECTED Final   Candida parapsilosis NOT DETECTED NOT DETECTED Final   Candida tropicalis NOT DETECTED NOT DETECTED Final   Cryptococcus neoformans/gattii NOT DETECTED NOT DETECTED Final   CTX-M ESBL NOT DETECTED NOT DETECTED Final   Carbapenem resistance IMP NOT DETECTED NOT DETECTED Final   Carbapenem resistance KPC NOT DETECTED NOT DETECTED Final   Carbapenem resistance NDM NOT DETECTED NOT DETECTED Final   Carbapenem resistance  VIM NOT DETECTED NOT DETECTED Final    Comment: Performed at [facility], [address] [place], [place] [address]  CULTURE, BLOOD (ROUTINE X 2)  w Reflex to ID Panel     Status: None (Preliminary result)   Collection Time: 10/18/20  8:20 AM   Specimen: BLOOD  Result Value Ref Range Status   Specimen Description BLOOD BLOOD RIGHT ARM  Final   Special Requests   Final    BOTTLES DRAWN AEROBIC AND ANAEROBIC Blood Culture adequate volume   Culture  Setup Time   Final    GRAM NEGATIVE RODS AEROBIC BOTTLE ONLY CRITICAL VALUE NOTED.  VALUE IS CONSISTENT WITH PREVIOUSLY REPORTED AND CALLED VALUE. Performed at [facility], [address] [place], [place] [address]    Culture GRAM NEGATIVE RODS  Final   Report Status PENDING  Incomplete  Urine Culture     Status: Abnormal (Preliminary result)   Collection Time: 10/18/20  8:38 AM   Specimen: Urine, Random  Result Value Ref Range Status   Specimen Description   Final    URINE, RANDOM Performed at [facility], [address], [place], [place] [address]    Special Requests   Final    NONE Performed at [facility], [address] [place], [place] [address]    Culture (A)  Final    >=100,000 COLONIES/mL PSEUDOMONAS AERUGINOSA SUSCEPTIBILITIES TO FOLLOW Performed at [facility], [place] [address], [place], [place] [address]    Report Status PENDING  Incomplete         Radiology Studies: DG Chest Port 1 View  Result Date: 10/19/2020 CLINICAL DATA:  65 year old female with shortness of breath. EXAM: PORTABLE CHEST 1 VIEW COMPARISON:  Portable chest 10/18/2020 and earlier. FINDINGS: Portable AP semi upright view at 0713 hours. Continued lower lung volumes than baseline compared to last month. Mediastinal contours remain normal. Visualized tracheal air column is within normal limits. No pneumothorax. No pleural effusion. Mild crowding of lung markings including along the right minor fissure  is stable since 10/15/2020. Paucity of bowel gas in the upper abdomen. No acute osseous abnormality identified. IMPRESSION: Stable low lung volumes and suspected mild atelectasis. No new cardiopulmonary abnormality. Electronically Signed   By: Genevie Ann M.D.   On: 10/19/2020 08:00   DG Chest Port 1 View  Result Date: 10/18/2020 CLINICAL DATA:  Fever. EXAM: PORTABLE CHEST 1 VIEW COMPARISON:  October 15, 2020. FINDINGS: The heart size and mediastinal contours are within normal limits. Right lung is clear. Minimal left basilar subsegmental atelectasis is noted and decreased compared to prior exam. The visualized skeletal structures are unremarkable. IMPRESSION: Improved left basilar subsegmental atelectasis. Electronically Signed   By: Marijo Conception M.D.   On: 10/18/2020 08:51        Scheduled Meds: . Chlorhexidine Gluconate Cloth  6 each Topical Q0600  . metoprolol tartrate  12.5 mg Oral BID  . multivitamin with minerals  1 tablet Oral Daily  . psyllium  1 packet Oral Daily  . sodium bicarbonate  650 mg Oral TID  . traZODone  25 mg Oral QHS   Continuous Infusions: . lactated ringers 100 mL/hr at 10/19/20 1004  . piperacillin-tazobactam (ZOSYN)  IV 2.25 g (10/19/20 1751)     LOS: 3 days   Time spent: 47mins Greater than 50% of this time was spent in counseling, explanation of diagnosis, planning of further management, and coordination of care.   Voice Recognition Viviann Spare dictation system was used to create this note, attempts have been made to correct errors. Please contact the author with questions and/or clarifications.   Florencia Reasons, MD PhD FACP [facility]  Available via Epic secure chat 7am-7pm for nonurgent issues Please page for urgent issues To page the attending provider between 7A-7P or the covering provider during after hours 7P-7A, please log into the web site [email or web address] and access using universal [place] password for that web site. If you do not have the  password, please call the hospital operator.    10/19/2020, 5:53 PM

## 2020-10-19 NOTE — Progress Notes (Signed)
  Subjective: No acute events overnight.  The patient states that she is feeling much better this morning.  She denies abdominal pain or suprapubic discomfort.  She spiked a temperature of 101 F last night at 7:30 PM, but has been afebrile since.  Serum creatinine slightly improving.  Leukocytosis has gone from 7.4 to 17.1, but lactate has decreased to the normal range.  Preliminary blood cultures are growing gram-negative rods.  Objective: Vital signs in last 24 hours: Temp:  [98.8 F (37.1 C)-101.8 F (38.8 C)] 98.9 F (37.2 C) (04/30 0828) Pulse Rate:  [119-151] 134 (04/30 0828) Resp:  [18-40] 18 (04/30 0828) BP: (82-137)/(41-72) 104/69 (04/30 0828) SpO2:  [92 %-100 %] 98 % (04/30 0828)  Intake/Output from previous day: 04/29 0701 - 04/30 0700 In: 2039.1 [P.O.:480; IV Piggyback:1559.1] Out: 1250 [Urine:1250]  Intake/Output this shift: No intake/output data recorded.  Physical Exam:  General: Alert and oriented CV: RRR, palpable distal pulses Lungs: CTAB, equal chest rise Abdomen: Obese, soft, NTND, no rebound or guarding Gu: Foley catheter in place and draining yellow urine Ext: NT, No erythema  Lab Results: Recent Labs    10/17/20 0353 10/19/20 0520  HGB 10.2* 9.0*  HCT 32.6* 28.9*   BMET Recent Labs    10/18/20 0430 10/19/20 0520  NA 140 138  K 4.2 5.0  CL 108 107  CO2 20* 23  GLUCOSE 121* 107*  BUN 26* 26*  CREATININE 3.89* 3.47*  CALCIUM 8.7* 8.0*     Studies/Results: DG Chest Port 1 View  Result Date: 10/19/2020 CLINICAL DATA:  65 year old female with shortness of breath. EXAM: PORTABLE CHEST 1 VIEW COMPARISON:  Portable chest 10/18/2020 and earlier. FINDINGS: Portable AP semi upright view at 0713 hours. Continued lower lung volumes than baseline compared to last month. Mediastinal contours remain normal. Visualized tracheal air column is within normal limits. No pneumothorax. No pleural effusion. Mild crowding of lung markings including along the  right minor fissure is stable since 10/15/2020. Paucity of bowel gas in the upper abdomen. No acute osseous abnormality identified. IMPRESSION: Stable low lung volumes and suspected mild atelectasis. No new cardiopulmonary abnormality. Electronically Signed   By: Genevie Ann M.D.   On: 10/19/2020 08:00   DG Chest Port 1 View  Result Date: 10/18/2020 CLINICAL DATA:  Fever. EXAM: PORTABLE CHEST 1 VIEW COMPARISON:  October 15, 2020. FINDINGS: The heart size and mediastinal contours are within normal limits. Right lung is clear. Minimal left basilar subsegmental atelectasis is noted and decreased compared to prior exam. The visualized skeletal structures are unremarkable. IMPRESSION: Improved left basilar subsegmental atelectasis. Electronically Signed   By: Marijo Conception M.D.   On: 10/18/2020 08:51    Assessment/Plan: 65 year old female with metastatic ovarian cancer causing bilateral ureteral obstruction necessitating bilateral indwelling ureteral stents.  Currently being treated for acute renal failure and suspected UTI.  -Preliminary blood cultures are growing gram-negative rods.  Currently on IV Zosyn.  Await speciation and adjust antibiotics accordingly. -Keep Foley catheter in place and closely monitor urine output.  Serum creatinine is slowly downtrending.  Status post bilateral ureteral stent placement on 10/10/2020.  CT at admission showed stents are in good position -Will continue to monitor   LOS: 3 days   Ellison Hughs, MD [facility] Pager: [phone]  10/19/2020, 9:07 AM

## 2020-10-19 NOTE — Progress Notes (Signed)
[facility]  ROUNDING NOTE   Subjective:   Patient resting in bed, in no acute distress or pain. Denies SOB, nausea or vomiting. Urine output for the preceding 24 hours is 1250 ml.  Renal function improving gradually  Objective:  Vital signs in last 24 hours:  Temp:  [97.9 F (36.6 C)-101 F (38.3 C)] 97.9 F (36.6 C) (04/30 1203) Pulse Rate:  [110-147] 110 (04/30 1203) Resp:  [18-40] 18 (04/30 1203) BP: (91-137)/(54-72) 102/70 (04/30 1203) SpO2:  [98 %-99 %] 99 % (04/30 1203)  Weight change:  Filed Weights   10/15/20 1156  Weight: 108.9 kg    Intake/Output: I/O last 3 completed shifts: In: 2063.2 [P.O.:480; IV Piggyback:1583.2] Out: 4097 [Urine:3450]   Intake/Output this shift:  Total I/O In: 1254.3 [P.O.:240; I.V.:1014.3] Out: 0   Physical Exam: General: Resting in bed, in no acute distress  Head:  Moist oral mucosal membranes  Eyes: Anicteric  Lungs:  Lungs clear bilaterally, normal respiratory effort   Heart: S1S2,no rubs or gallops  Abdomen:  Soft, nontender, non distended  Extremities:  No peripheral edema.  Neurologic: Able to answer simple questions appropriately  Skin: No acute lesions or rashes       Basic Metabolic Panel: Recent Labs  Lab 10/15/20 1514 10/15/20 2135 10/16/20 0603 10/17/20 0353 10/18/20 0430 10/19/20 0520  NA 129*  --  135 139 140 138  K 4.4  --  4.7 4.5 4.2 5.0  CL 99  --  103 108 108 107  CO2 18*  --  19* 21* 20* 23  GLUCOSE 101*  --  98 104* 121* 107*  BUN 36*  --  36* 31* 26* 26*  CREATININE 7.90*  --  6.82* 5.11* 3.89* 3.47*  CALCIUM 8.7*  --  8.6* 8.6* 8.7* 8.0*  MG  --  2.3  --   --   --   --     Liver Function Tests: Recent Labs  Lab 10/15/20 1514 10/19/20 0520  AST 18 41  ALT 10 20  ALKPHOS 111 118  BILITOT 0.8 0.6  PROT 7.1 6.0*  ALBUMIN 2.9* 2.3*   No results for input(s): LIPASE, AMYLASE in the last 168 hours. No results for input(s): AMMONIA in the last 168 hours.  CBC: Recent  Labs  Lab 10/15/20 1249 10/16/20 0603 10/17/20 0353 10/19/20 0520  WBC 8.2 9.0 7.4 17.1*  NEUTROABS 6.2  --  5.1 14.6*  HGB 11.4* 10.9* 10.2* 9.0*  HCT 36.6 34.8* 32.6* 28.9*  MCV 85.7 84.3 84.7 86.3  PLT 356 375 347 187    Cardiac Enzymes: No results for input(s): CKTOTAL, CKMB, CKMBINDEX, TROPONINI in the last 168 hours.  BNP: Invalid input(s): POCBNP  CBG: No results for input(s): GLUCAP in the last 168 hours.  Microbiology: Results for orders placed or performed during the hospital encounter of 10/15/20  Resp Panel by RT-PCR (Flu A&B, Covid) Nasopharyngeal Swab     Status: None   Collection Time: 10/15/20 12:20 PM   Specimen: Nasopharyngeal Swab; Nasopharyngeal(NP) swabs in vial transport medium  Result Value Ref Range Status   SARS Coronavirus 2 by RT PCR NEGATIVE NEGATIVE Final    Comment: (NOTE) SARS-CoV-2 target nucleic acids are NOT DETECTED.  The SARS-CoV-2 RNA is generally detectable in upper respiratory specimens during the acute phase of infection. The lowest concentration of SARS-CoV-2 viral copies this assay can detect is 138 copies/mL. A negative result does not preclude SARS-Cov-2 infection and should not be used as the sole  basis for treatment or other patient management decisions. A negative result may occur with  improper specimen collection/handling, submission of specimen other than nasopharyngeal swab, presence of viral mutation(s) within the areas targeted by this assay, and inadequate number of viral copies(<138 copies/mL). A negative result must be combined with clinical observations, patient history, and epidemiological information. The expected result is Negative.  Fact Sheet for Patients:  EntrepreneurPulse.com.au  Fact Sheet for Healthcare Providers:  IncredibleEmployment.be  This test is no t yet approved or cleared by the [place] FDA and  has been authorized for detection and/or diagnosis of  SARS-CoV-2 by FDA under an Emergency Use Authorization (EUA). This EUA will remain  in effect (meaning this test can be used) for the duration of the COVID-19 declaration under Section 564(b)(1) of the Act, 21 U.S.C.section 360bbb-3(b)(1), unless the authorization is terminated  or revoked sooner.       Influenza A by PCR NEGATIVE NEGATIVE Final   Influenza B by PCR NEGATIVE NEGATIVE Final    Comment: (NOTE) The Xpert Xpress SARS-CoV-2/FLU/RSV plus assay is intended as an aid in the diagnosis of influenza from Nasopharyngeal swab specimens and should not be used as a sole basis for treatment. Nasal washings and aspirates are unacceptable for Xpert Xpress SARS-CoV-2/FLU/RSV testing.  Fact Sheet for Patients: EntrepreneurPulse.com.au  Fact Sheet for Healthcare Providers: IncredibleEmployment.be  This test is not yet approved or cleared by the [place] FDA and has been authorized for detection and/or diagnosis of SARS-CoV-2 by FDA under an Emergency Use Authorization (EUA). This EUA will remain in effect (meaning this test can be used) for the duration of the COVID-19 declaration under Section 564(b)(1) of the Act, 21 U.S.C. section 360bbb-3(b)(1), unless the authorization is terminated or revoked.  Performed at [facility], [address] [place], [place] [address]   CULTURE, BLOOD (ROUTINE X 2) w Reflex to ID Panel     Status: None (Preliminary result)   Collection Time: 10/18/20  8:08 AM   Specimen: BLOOD  Result Value Ref Range Status   Specimen Description BLOOD BLOOD LEFT HAND  Final   Special Requests   Final    BOTTLES DRAWN AEROBIC AND ANAEROBIC Blood Culture adequate volume   Culture  Setup Time   Final    Organism ID to follow GRAM NEGATIVE RODS AEROBIC BOTTLE ONLY CRITICAL RESULT CALLED TO, READ BACK BY AND VERIFIED WITH: JASON ROBBINS AT 2246 10/18/20 MF Performed at [facility], [address] [place], [place] [address]    Culture GRAM NEGATIVE RODS  Final   Report Status PENDING  Incomplete  Blood Culture ID Panel (Reflexed)     Status: Abnormal   Collection Time: 10/18/20  8:08 AM  Result Value Ref Range Status   Enterococcus faecalis NOT DETECTED NOT DETECTED Final   Enterococcus Faecium NOT DETECTED NOT DETECTED Final   Listeria monocytogenes NOT DETECTED NOT DETECTED Final   Staphylococcus species NOT DETECTED NOT DETECTED Final   Staphylococcus aureus (BCID) NOT DETECTED NOT DETECTED Final   Staphylococcus epidermidis NOT DETECTED NOT DETECTED Final   Staphylococcus lugdunensis NOT DETECTED NOT DETECTED Final   Streptococcus species NOT DETECTED NOT DETECTED Final   Streptococcus agalactiae NOT DETECTED NOT DETECTED Final   Streptococcus pneumoniae NOT DETECTED NOT DETECTED Final   Streptococcus pyogenes NOT DETECTED NOT DETECTED Final   A.calcoaceticus-baumannii NOT DETECTED NOT DETECTED Final   Bacteroides fragilis NOT DETECTED NOT DETECTED Final   Enterobacterales NOT DETECTED NOT DETECTED Final  Enterobacter cloacae complex NOT DETECTED NOT DETECTED Final   Escherichia coli NOT DETECTED NOT DETECTED Final   Klebsiella aerogenes NOT DETECTED NOT DETECTED Final   Klebsiella oxytoca NOT DETECTED NOT DETECTED Final   Klebsiella pneumoniae NOT DETECTED NOT DETECTED Final   Proteus species NOT DETECTED NOT DETECTED Final   Salmonella species NOT DETECTED NOT DETECTED Final   Serratia marcescens NOT DETECTED NOT DETECTED Final   Haemophilus influenzae NOT DETECTED NOT DETECTED Final   Neisseria meningitidis NOT DETECTED NOT DETECTED Final   Pseudomonas aeruginosa DETECTED (A) NOT DETECTED Final    Comment: CRITICAL RESULT CALLED TO, READ BACK BY AND VERIFIED WITH: JASON ROBBINS AT 2248 10/18/20 MF    Stenotrophomonas maltophilia NOT DETECTED NOT DETECTED Final   Candida albicans NOT DETECTED NOT DETECTED Final   Candida auris NOT DETECTED NOT DETECTED Final    Candida glabrata NOT DETECTED NOT DETECTED Final   Candida krusei NOT DETECTED NOT DETECTED Final   Candida parapsilosis NOT DETECTED NOT DETECTED Final   Candida tropicalis NOT DETECTED NOT DETECTED Final   Cryptococcus neoformans/gattii NOT DETECTED NOT DETECTED Final   CTX-M ESBL NOT DETECTED NOT DETECTED Final   Carbapenem resistance IMP NOT DETECTED NOT DETECTED Final   Carbapenem resistance KPC NOT DETECTED NOT DETECTED Final   Carbapenem resistance NDM NOT DETECTED NOT DETECTED Final   Carbapenem resistance VIM NOT DETECTED NOT DETECTED Final    Comment: Performed at [facility], [address] [place], [place] [address]  CULTURE, BLOOD (ROUTINE X 2) w Reflex to ID Panel     Status: None (Preliminary result)   Collection Time: 10/18/20  8:20 AM   Specimen: BLOOD  Result Value Ref Range Status   Specimen Description BLOOD BLOOD RIGHT ARM  Final   Special Requests   Final    BOTTLES DRAWN AEROBIC AND ANAEROBIC Blood Culture adequate volume   Culture  Setup Time   Final    GRAM NEGATIVE RODS AEROBIC BOTTLE ONLY CRITICAL VALUE NOTED.  VALUE IS CONSISTENT WITH PREVIOUSLY REPORTED AND CALLED VALUE. Performed at [facility], [address] [place], [place] [address]    Culture GRAM NEGATIVE RODS  Final   Report Status PENDING  Incomplete  Urine Culture     Status: Abnormal (Preliminary result)   Collection Time: 10/18/20  8:38 AM   Specimen: Urine, Random  Result Value Ref Range Status   Specimen Description   Final    URINE, RANDOM Performed at [facility], [address], [place], [place] [address]    Special Requests   Final    NONE Performed at [facility], [address], [place], [place] [address]    Culture (A)  Final    >=100,000 COLONIES/mL PSEUDOMONAS AERUGINOSA SUSCEPTIBILITIES TO FOLLOW Performed at [facility], [place] [address] [place], [place] [address]    Report Status PENDING  Incomplete    Coagulation  Studies: No results for input(s): LABPROT, INR in the last 72 hours.  Urinalysis: Recent Labs    10/18/20 0838  COLORURINE YELLOW*  LABSPEC 1.012  PHURINE 6.0  GLUCOSEU NEGATIVE  HGBUR LARGE*  BILIRUBINUR NEGATIVE  KETONESUR NEGATIVE  PROTEINUR 30*  NITRITE NEGATIVE  LEUKOCYTESUR LARGE*      Imaging: DG Chest Port 1 View  Result Date: 10/19/2020 CLINICAL DATA:  65 year old female with shortness of breath. EXAM: PORTABLE CHEST 1 VIEW COMPARISON:  Portable chest 10/18/2020 and earlier. FINDINGS: Portable AP semi upright view at 0713 hours. Continued  lower lung volumes than baseline compared to last month. Mediastinal contours remain normal. Visualized tracheal air column is within normal limits. No pneumothorax. No pleural effusion. Mild crowding of lung markings including along the right minor fissure is stable since 10/15/2020. Paucity of bowel gas in the upper abdomen. No acute osseous abnormality identified. IMPRESSION: Stable low lung volumes and suspected mild atelectasis. No new cardiopulmonary abnormality. Electronically Signed   By: Genevie Ann M.D.   On: 10/19/2020 08:00   DG Chest Port 1 View  Result Date: 10/18/2020 CLINICAL DATA:  Fever. EXAM: PORTABLE CHEST 1 VIEW COMPARISON:  October 15, 2020. FINDINGS: The heart size and mediastinal contours are within normal limits. Right lung is clear. Minimal left basilar subsegmental atelectasis is noted and decreased compared to prior exam. The visualized skeletal structures are unremarkable. IMPRESSION: Improved left basilar subsegmental atelectasis. Electronically Signed   By: Marijo Conception M.D.   On: 10/18/2020 08:51     Medications:   . lactated ringers 100 mL/hr at 10/19/20 1004  . piperacillin-tazobactam 2.25 g (10/19/20 1153)   . Chlorhexidine Gluconate Cloth  6 each Topical Q0600  . metoprolol tartrate  12.5 mg Oral BID  . multivitamin with minerals  1 tablet Oral Daily  . psyllium  1 packet Oral Daily  . sodium  bicarbonate  650 mg Oral TID  . traZODone  25 mg Oral QHS   acetaminophen **OR** acetaminophen, labetalol, ondansetron **OR** ondansetron (ZOFRAN) IV  Assessment/ Plan:  Tracy Goodman is a 65 y.o.  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, presented to the emergency department at the request of cancer center for abnormal labs, aki, likely obstructive nephropathy  Acute kidney injury #Bilateral hydronephrosis status post stent placement 10/10/2020 Baseline creatinine 0.90 from September 04, 2020  Renal function progressively improving IVF 100 ml/hr on flow   Lab Results  Component Value Date   CREATININE 3.47 (H) 10/19/2020   CREATININE 3.89 (H) 10/18/2020   CREATININE 5.11 (H) 10/17/2020    Intake/Output Summary (Last 24 hours) at 10/19/2020 1230 Last data filed at 10/19/2020 1159 Gross per 24 hour  Intake 1841.7 ml  Output 1250 ml  Net 591.7 ml    #Hypertension BP today is 104/69, within acceptable range Management per primary team    LOS: 3 Nyanna Heideman 4/30/202212:30 PM

## 2020-10-20 DIAGNOSIS — C569 Malignant neoplasm of unspecified ovary: Secondary | ICD-10-CM

## 2020-10-20 DIAGNOSIS — N138 Other obstructive and reflux uropathy: Secondary | ICD-10-CM

## 2020-10-20 DIAGNOSIS — N390 Urinary tract infection, site not specified: Secondary | ICD-10-CM

## 2020-10-20 DIAGNOSIS — C786 Secondary malignant neoplasm of retroperitoneum and peritoneum: Secondary | ICD-10-CM

## 2020-10-20 DIAGNOSIS — N179 Acute kidney failure, unspecified: Secondary | ICD-10-CM | POA: Diagnosis not present

## 2020-10-20 DIAGNOSIS — B965 Pseudomonas (aeruginosa) (mallei) (pseudomallei) as the cause of diseases classified elsewhere: Secondary | ICD-10-CM

## 2020-10-20 LAB — BASIC METABOLIC PANEL
Anion gap: 8 (ref 5–15)
BUN: 25 mg/dL — ABNORMAL HIGH (ref 8–23)
CO2: 24 mmol/L (ref 22–32)
Calcium: 8.1 mg/dL — ABNORMAL LOW (ref 8.9–10.3)
Chloride: 106 mmol/L (ref 98–111)
Creatinine, Ser: 3.12 mg/dL — ABNORMAL HIGH (ref 0.44–1.00)
GFR, Estimated: 16 mL/min — ABNORMAL LOW (ref >60)
Glucose, Bld: 93 mg/dL (ref 70–99)
Potassium: 4.6 mmol/L (ref 3.5–5.1)
Sodium: 138 mmol/L (ref 135–145)

## 2020-10-20 LAB — CBC WITH DIFFERENTIAL/PLATELET
Abs Immature Granulocytes: 0.08 10*3/uL — ABNORMAL HIGH (ref 0.00–0.07)
Basophils Absolute: 0 10*3/uL (ref 0.0–0.1)
Basophils Relative: 0 %
Eosinophils Absolute: 0.2 10*3/uL (ref 0.0–0.5)
Eosinophils Relative: 2 %
HCT: 29.9 % — ABNORMAL LOW (ref 36.0–46.0)
Hemoglobin: 9.1 g/dL — ABNORMAL LOW (ref 12.0–15.0)
Immature Granulocytes: 1 %
Lymphocytes Relative: 9 %
Lymphs Abs: 1 10*3/uL (ref 0.7–4.0)
MCH: 26.5 pg (ref 26.0–34.0)
MCHC: 30.4 g/dL (ref 30.0–36.0)
MCV: 86.9 fL (ref 80.0–100.0)
Monocytes Absolute: 0.8 10*3/uL (ref 0.1–1.0)
Monocytes Relative: 7 %
Neutro Abs: 9.5 10*3/uL — ABNORMAL HIGH (ref 1.7–7.7)
Neutrophils Relative %: 81 %
Platelets: 105 10*3/uL — ABNORMAL LOW (ref 150–400)
RBC: 3.44 MIL/uL — ABNORMAL LOW (ref 3.87–5.11)
RDW: 14.1 % (ref 11.5–15.5)
WBC: 11.6 10*3/uL — ABNORMAL HIGH (ref 4.0–10.5)
nRBC: 0 % (ref 0.0–0.2)

## 2020-10-20 LAB — URINE CULTURE: Culture: >=100000 — AB

## 2020-10-20 MED ORDER — SACCHAROMYCES BOULARDII 250 MG PO CAPS
250.0000 mg | ORAL_CAPSULE | Freq: Two times a day (BID) | ORAL | Status: DC
  Administered 2020-10-20 – 2020-10-21 (×2): 250 mg via ORAL
  Filled 2020-10-20 (×3): qty 1

## 2020-10-20 MED ORDER — SODIUM BICARBONATE 650 MG PO TABS
650.0000 mg | ORAL_TABLET | Freq: Every day | ORAL | Status: DC
  Administered 2020-10-21: 650 mg via ORAL
  Filled 2020-10-20: qty 1

## 2020-10-20 MED ORDER — PIPERACILLIN-TAZOBACTAM 3.375 G IVPB
3.3750 g | Freq: Three times a day (TID) | INTRAVENOUS | Status: DC
  Administered 2020-10-20: 3.375 g via INTRAVENOUS
  Filled 2020-10-20: qty 50

## 2020-10-20 MED ORDER — CIPROFLOXACIN HCL 500 MG PO TABS
750.0000 mg | ORAL_TABLET | Freq: Every day | ORAL | Status: DC
  Administered 2020-10-20: 750 mg via ORAL
  Filled 2020-10-20: qty 1.5
  Filled 2020-10-20: qty 2

## 2020-10-20 NOTE — Progress Notes (Signed)
   10/20/20 0617  Assess: MEWS Score  Temp 99.8 F (37.7 C)  BP 126/77  Pulse Rate (!) 109  Resp (!) 22  Level of Consciousness Alert  SpO2 100 %  O2 Device Nasal Cannula  O2 Flow Rate (L/min) 2 L/min  Assess: MEWS Score  MEWS Temp 0  MEWS Systolic 0  MEWS Pulse 1  MEWS RR 1  MEWS LOC 0  MEWS Score 2  MEWS Score Color Yellow  Assess: if the MEWS score is Yellow or Red  Were vital signs taken at a resting state? Yes  Focused Assessment No change from prior assessment  Early Detection of Sepsis Score *See Row Information* Medium  MEWS guidelines implemented *See Row Information* No, other (Comment) (RR and HR baseline for this patient.)

## 2020-10-20 NOTE — Progress Notes (Signed)
  Subjective: No acute events overnight.  The patient denies any pain.  She denies any dysuria. Afebrile overnight. Denies any nausea or vomiting.  Tolerating regular diet without issue.  Objective: Vital signs in last 24 hours: Temp:  [97.9 F (36.6 C)-99.8 F (37.7 C)] 98.6 F (37 C) (05/01 0727) Pulse Rate:  [103-110] 103 (05/01 0727) Resp:  [18-22] 18 (05/01 0727) BP: (102-126)/(57-77) 124/77 (05/01 0727) SpO2:  [96 %-100 %] 100 % (05/01 0727) Weight:  [120 kg] 120 kg (05/01 0617)  Intake/Output from previous day: 04/30 0701 - 05/01 0700 In: 4265.5 [P.O.:1920; I.V.:2147.9; IV Piggyback:197.7] Out: 1400 [Urine:1400]  Intake/Output this shift: No intake/output data recorded.  Physical Exam:  General: Alert and oriented CV: RRR, palpable distal pulses Lungs: CTAB, equal chest rise, stable on 2 L [place] Abdomen: Obese, soft, NTND, no rebound or guarding Gu: Foley catheter in place and draining yellow urine Ext: NT, No erythema  Lab Results: Recent Labs    10/19/20 0520 10/20/20 0356  HGB 9.0* 9.1*  HCT 28.9* 29.9*   BMET Recent Labs    10/19/20 1447 10/20/20 0356  NA 138 138  K 4.6 4.6  CL 105 106  CO2 25 24  GLUCOSE 121* 93  BUN 27* 25*  CREATININE 3.38* 3.12*  CALCIUM 8.0* 8.1*     Studies/Results: DG Chest Port 1 View  Result Date: 10/19/2020 CLINICAL DATA:  65 year old female with shortness of breath. EXAM: PORTABLE CHEST 1 VIEW COMPARISON:  Portable chest 10/18/2020 and earlier. FINDINGS: Portable AP semi upright view at 0713 hours. Continued lower lung volumes than baseline compared to last month. Mediastinal contours remain normal. Visualized tracheal air column is within normal limits. No pneumothorax. No pleural effusion. Mild crowding of lung markings including along the right minor fissure is stable since 10/15/2020. Paucity of bowel gas in the upper abdomen. No acute osseous abnormality identified. IMPRESSION: Stable low lung volumes and suspected  mild atelectasis. No new cardiopulmonary abnormality. Electronically Signed   By: Genevie Ann M.D.   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  Currently being treated for acute renal failure and pseudomonal UTI.  Blood cultures pending.  -Await speciation of the blood cultures prior to narrowing antibiotics, but may be able to discharge her on extended course of ciprofloxacin. -This point, her renal function appears to be improving and she has been afebrile x24 hours.  The appropriate to remove her Foley catheter at this time.  Will continue to follow.     LOS: 4 days   Tracy Hughs, MD [facility] Pager: [phone]  10/20/2020, 9:23 AM

## 2020-10-20 NOTE — Progress Notes (Signed)
This 65 yo female was admitted to 203 from 2A.  Pt with dx ovarian CA with mets.  VSS upon arrival and pt without complaints.  Pt's husband present.

## 2020-10-20 NOTE — Progress Notes (Signed)
PROGRESS NOTE    Tracy Goodman  HYI:[id number] DOB: Mar 11, 1956 DOA: 10/15/2020 PCP: [name], MD    Chief Complaint  Patient presents with  . Abnormal Lab    Brief Narrative:  Tracy Goodman is a 65 y.o. 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, presents to the emergency department at the request of cancer center for abnormal labs, aki, likely obstructive nephropathy   Subjective:  She is feeling better, no fever blood culture and urine  positive for Pseudomonas She denies pain, no confusion ,foley in place She wants to go home, husband at bedside    Assessment & Plan:   Principal Problem:   AKI (acute kidney injury) ([place]) Active Problems:   Hyperlipidemia   Hypertension   Morbid obesity ([place])   Enlarged thyroid gland   Umbilical hernia   Breast lump on right side at 10 o'clock position   Bilateral hydronephrosis   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 Gale Journey and urology Dr. Louis Meckel, both recommend Cipro treatment,  -Patient will need total of 10 days antibiotic treatment, today is 3/10, per infectious disease change to Cipro high-dose for 2 days and then low-dose to finish course, case discussed with pharmacy . 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, bladder involvement as well as malignant ascites and omental carcinomatosis  -Management per oncology and palliative care  Bilateral hydronephrosis /obstructive nephropathy status post bilateral stent placement on 4/21  Right-sided breast mass at the 10 o'clock position-outpatient follow-up  Insomnia- trazodone 25 mg nightly  The patient's BMI is: Body mass index is 53.43 kg/m.Marland Kitchen     Unresulted Labs (From admission, onward)          Start     Ordered   10/21/20 0500  CBC  Tomorrow morning,   R       Question:  Specimen collection method  Answer:  Lab=Lab collect   10/20/20 7867   10/21/20 6720  Basic metabolic panel  Daily,   R     Question:  Specimen collection method  Answer:  Lab=Lab collect   10/20/20 0952            DVT prophylaxis: Place TED hose Start: 10/15/20 1508   Code Status: Full Family Communication: husband at bedside on 4/29, 4/30, 5/1 Disposition:   Status is: Inpatient  Dispo: The patient is from: Home  Anticipated d/c is to: Home              Anticipated d/c date is: Likely tomorrow on 5/2 if urology clears her                Consultants:   Urology  Nephrology  Hematology/oncology  Palliative care  Procedures:   Foley placement   Antimicrobials:   Anti-infectives (From admission, onward)   Start      Dose/Rate Route Frequency Ordered Stop   10/20/20 1800  ciprofloxacin (CIPRO) tablet 750 mg        750 mg Oral Daily-1800 10/20/20 1331     10/20/20 1200  piperacillin-tazobactam (ZOSYN) IVPB 3.375 g  Status:  Discontinued        3.375 g 12.5 mL/hr over 240 Minutes Intravenous Every 8 hours 10/20/20 0711 10/20/20 1331   10/19/20 1800  piperacillin-tazobactam (ZOSYN) IVPB 2.25 g  Status:  Discontinued        2.25 g 100 mL/hr over 30 Minutes Intravenous Every 6 hours 10/19/20 1230 10/19/20 1231   10/19/20 1800  piperacillin-tazobactam (ZOSYN) 2.25 g in sodium chloride 0.9 % 50 mL IVPB  Status:  Discontinued        2.25 g 100 mL/hr over 30 Minutes Intravenous Every 6 hours 10/19/20 1232 10/20/20 0711   10/18/20 1200  piperacillin-tazobactam (ZOSYN) 2.25 g in sodium chloride 0.9 % 50 mL IVPB  Status:  Discontinued        2.25 g 100 mL/hr over 30 Minutes Intravenous Every 6 hours 10/18/20 1043 10/18/20 1052   10/18/20 1200  piperacillin-tazobactam (ZOSYN) IVPB 2.25 g  Status:  Discontinued        2.25 g 100 mL/hr over 30 Minutes Intravenous Every 6 hours 10/18/20 1052 10/19/20 1232          Objective: Vitals:   10/20/20 0617 10/20/20 0727 10/20/20 1126 10/20/20 1415  BP: 126/77 124/77 (!) 114/30 121/68  Pulse: (!) 109 (!) 103 94 (!) 103  Resp: (!) 22 18 18 18   Temp: 99.8 F (37.7 C) 98.6 F (37 C) 98.3 F (36.8 C) 98.1 F (36.7 C)  TempSrc: Oral Oral Oral Oral  SpO2: 100% 100% 100% 99%  Weight: 120 kg     Height:        Intake/Output Summary (Last 24 hours) at 10/20/2020 1812 Last data filed at 10/20/2020 1600 Gross per 24 hour  Intake 1795.99 ml  Output 1375 ml  Net 420.99 ml   Filed Weights   10/15/20 1156 10/20/20 0617  Weight: 108.9 kg 120 kg    Examination:  General exam: calm, NAD Respiratory system: Clear to auscultation. Respiratory effort normal. Cardiovascular system: S1 & S2 heard, tachycardia has much improved, No pedal edema. Gastrointestinal system:  Abdomen is soft and nontender.  Normal bowel sounds heard. Central nervous system: Alert and oriented. No focal neurological deficits. Extremities: Symmetric 5 x 5 power. Skin: No rashes, lesions or ulcers Psychiatry: Judgement and insight appear normal. Mood & affect appropriate.     Data Reviewed: I have personally reviewed following labs and imaging studies  CBC: Recent Labs  Lab 10/15/20 1249 10/16/20 0603 10/17/20 0353 10/19/20 0520 10/20/20 0356  WBC 8.2 9.0 7.4 17.1* 11.6*  NEUTROABS 6.2  --  5.1 14.6* 9.5*  HGB 11.4* 10.9* 10.2* 9.0* 9.1*  HCT 36.6 34.8* 32.6* 28.9* 29.9*  MCV 85.7 84.3 84.7 86.3 86.9  PLT 356 375 347 187 105*    Basic Metabolic Panel: Recent Labs  Lab 10/15/20  2135 10/16/20 0603 10/17/20 0353 10/18/20 0430 10/19/20 0520 10/19/20 1447 10/20/20 0356  NA  --    < > 139 140 138 138 138  K  --    < > 4.5 4.2 5.0 4.6 4.6  CL  --    < > 108 108 107 105 106  CO2  --    < > 21* 20* 23 25 24   GLUCOSE  --    < > 104* 121* 107* 121* 93  BUN  --    < > 31* 26* 26* 27* 25*  CREATININE  --    < > 5.11* 3.89* 3.47* 3.38* 3.12*  CALCIUM  --    < > 8.6* 8.7* 8.0* 8.0* 8.1*  MG 2.3  --   --   --   --   --   --    < > = values in this interval not displayed.    GFR: Estimated Creatinine Clearance: 21.5 mL/min (A) (by C-G formula based on SCr of 3.12 mg/dL (H)).  Liver Function Tests: Recent Labs  Lab 10/15/20 1514 10/19/20 0520  AST 18 41  ALT 10 20  ALKPHOS 111 118  BILITOT 0.8 0.6  PROT 7.1 6.0*  ALBUMIN 2.9* 2.3*    CBG: No results for input(s): GLUCAP in the last 168 hours.   Recent Results (from the past 240 hour(s))  Resp Panel by RT-PCR (Flu A&B, Covid) Nasopharyngeal Swab     Status: None   Collection Time: 10/15/20 12:20 PM   Specimen: Nasopharyngeal Swab; Nasopharyngeal(NP) swabs in vial transport medium  Result Value Ref Range Status   SARS Coronavirus 2 by RT PCR NEGATIVE NEGATIVE Final    Comment: (NOTE) SARS-CoV-2 target  nucleic acids are NOT DETECTED.  The SARS-CoV-2 RNA is generally detectable in upper respiratory specimens during the acute phase of infection. The lowest concentration of SARS-CoV-2 viral copies this assay can detect is 138 copies/mL. A negative result does not preclude SARS-Cov-2 infection and should not be used as the sole basis for treatment or other patient management decisions. A negative result may occur with  improper specimen collection/handling, submission of specimen other than nasopharyngeal swab, presence of viral mutation(s) within the areas targeted by this assay, and inadequate number of viral copies(<138 copies/mL). A negative result must be combined with clinical observations, patient history, and epidemiological information. The expected result is Negative.  Fact Sheet for Patients:  EntrepreneurPulse.com.au  Fact Sheet for Healthcare Providers:  IncredibleEmployment.be  This test is no t yet approved or cleared by the [place] FDA and  has been authorized for detection and/or diagnosis of SARS-CoV-2 by FDA under an Emergency Use Authorization (EUA). This EUA will remain  in effect (meaning this test can be used) for the duration of the COVID-19 declaration under Section 564(b)(1) of the Act, 21 U.S.C.section 360bbb-3(b)(1), unless the authorization is terminated  or revoked sooner.       Influenza A by PCR NEGATIVE NEGATIVE Final   Influenza B by PCR NEGATIVE NEGATIVE Final    Comment: (NOTE) The Xpert Xpress SARS-CoV-2/FLU/RSV plus assay is intended as an aid in the diagnosis of influenza from Nasopharyngeal swab specimens and should not be used as a sole basis for treatment. Nasal washings and aspirates are unacceptable for Xpert Xpress SARS-CoV-2/FLU/RSV testing.  Fact Sheet for Patients: EntrepreneurPulse.com.au  Fact Sheet for Healthcare  Providers: IncredibleEmployment.be  This test is not yet approved or cleared by the [place] FDA and has been authorized for detection and/or  diagnosis of SARS-CoV-2 by FDA under an Emergency Use Authorization (EUA). This EUA will remain in effect (meaning this test can be used) for the duration of the COVID-19 declaration under Section 564(b)(1) of the Act, 21 U.S.C. section 360bbb-3(b)(1), unless the authorization is terminated or revoked.  Performed at [facility], [address] [place], [place] [address]   CULTURE, BLOOD (ROUTINE X 2) w Reflex to ID Panel     Status: Abnormal (Preliminary result)   Collection Time: 10/18/20  8:08 AM   Specimen: BLOOD  Result Value Ref Range Status   Specimen Description   Final    BLOOD BLOOD LEFT HAND Performed at [facility], [address], [place], [place] [address]    Special Requests   Final    BOTTLES DRAWN AEROBIC AND ANAEROBIC Blood Culture adequate volume Performed at [facility], [address] [place], [place] [address]    Culture  Setup Time   Final    GRAM NEGATIVE RODS AEROBIC BOTTLE ONLY CRITICAL RESULT CALLED TO, READ BACK BY AND VERIFIED WITH: JASON ROBBINS AT 2246 10/18/20 MF    Culture (A)  Final    PSEUDOMONAS AERUGINOSA SUSCEPTIBILITIES TO FOLLOW Performed at [facility], [place] [address] [place], [place] [address]    Report Status PENDING  Incomplete  Blood Culture ID Panel (Reflexed)     Status: Abnormal   Collection Time: 10/18/20  8:08 AM  Result Value Ref Range Status   Enterococcus faecalis NOT DETECTED NOT DETECTED Final   Enterococcus Faecium NOT DETECTED NOT DETECTED Final   Listeria monocytogenes NOT DETECTED NOT DETECTED Final   Staphylococcus species NOT DETECTED NOT DETECTED Final   Staphylococcus aureus (BCID) NOT DETECTED NOT DETECTED Final   Staphylococcus epidermidis NOT DETECTED NOT DETECTED Final   Staphylococcus lugdunensis NOT  DETECTED NOT DETECTED Final   Streptococcus species NOT DETECTED NOT DETECTED Final   Streptococcus agalactiae NOT DETECTED NOT DETECTED Final   Streptococcus pneumoniae NOT DETECTED NOT DETECTED Final   Streptococcus pyogenes NOT DETECTED NOT DETECTED Final   A.calcoaceticus-baumannii NOT DETECTED NOT DETECTED Final   Bacteroides fragilis NOT DETECTED NOT DETECTED Final   Enterobacterales NOT DETECTED NOT DETECTED Final   Enterobacter cloacae complex NOT DETECTED NOT DETECTED Final   Escherichia coli NOT DETECTED NOT DETECTED Final   Klebsiella aerogenes NOT DETECTED NOT DETECTED Final   Klebsiella oxytoca NOT DETECTED NOT DETECTED Final   Klebsiella pneumoniae NOT DETECTED NOT DETECTED Final   Proteus species NOT DETECTED NOT DETECTED Final   Salmonella species NOT DETECTED NOT DETECTED Final   Serratia marcescens NOT DETECTED NOT DETECTED Final   Haemophilus influenzae NOT DETECTED NOT DETECTED Final   Neisseria meningitidis NOT DETECTED NOT DETECTED Final   Pseudomonas aeruginosa DETECTED (A) NOT DETECTED Final    Comment: CRITICAL RESULT CALLED TO, READ BACK BY AND VERIFIED WITH: JASON ROBBINS AT 2248 10/18/20 MF    Stenotrophomonas maltophilia NOT DETECTED NOT DETECTED Final   Candida albicans NOT DETECTED NOT DETECTED Final   Candida auris NOT DETECTED NOT DETECTED Final   Candida glabrata NOT DETECTED NOT DETECTED Final   Candida krusei NOT DETECTED NOT DETECTED Final   Candida parapsilosis NOT DETECTED NOT DETECTED Final   Candida tropicalis NOT DETECTED NOT DETECTED Final   Cryptococcus neoformans/gattii NOT DETECTED NOT DETECTED Final   CTX-M ESBL NOT DETECTED NOT DETECTED Final   Carbapenem resistance IMP NOT DETECTED NOT DETECTED Final   Carbapenem resistance KPC NOT DETECTED NOT DETECTED  Final   Carbapenem resistance NDM NOT DETECTED NOT DETECTED Final   Carbapenem resistance VIM NOT DETECTED NOT DETECTED Final    Comment: Performed at [facility], [address] [place], [place] [address]  CULTURE, BLOOD (ROUTINE X 2) w Reflex to ID Panel     Status: Abnormal (Preliminary result)   Collection Time: 10/18/20  8:20 AM   Specimen: BLOOD  Result Value Ref Range Status   Specimen Description   Final    BLOOD BLOOD RIGHT ARM Performed at [facility], [address], [place], [place] [address]    Special Requests   Final    BOTTLES DRAWN AEROBIC AND ANAEROBIC Blood Culture adequate volume Performed at [facility], [address], [place], [place] [address]    Culture  Setup Time   Final    GRAM NEGATIVE RODS AEROBIC BOTTLE ONLY CRITICAL VALUE NOTED.  VALUE IS CONSISTENT WITH PREVIOUSLY REPORTED AND CALLED VALUE. Performed at [facility], [address] [place], [place] [address]    Culture PSEUDOMONAS AERUGINOSA (A)  Final   Report Status PENDING  Incomplete  Urine Culture     Status: Abnormal   Collection Time: 10/18/20  8:38 AM   Specimen: Urine, Random  Result Value Ref Range Status   Specimen Description   Final    URINE, RANDOM Performed at [facility], [address], [place], [place] [address]    Special Requests   Final    NONE Performed at [facility], [place], [place] [address]    Culture >=100,000 COLONIES/mL PSEUDOMONAS AERUGINOSA (A)  Final   Report Status 10/20/2020 FINAL  Final   Organism ID, Bacteria PSEUDOMONAS AERUGINOSA (A)  Final      Susceptibility   Pseudomonas aeruginosa - MIC*    CEFTAZIDIME 4 SENSITIVE Sensitive     CIPROFLOXACIN <=0.25 SENSITIVE Sensitive     GENTAMICIN <=1 SENSITIVE Sensitive     IMIPENEM 1 SENSITIVE Sensitive     PIP/TAZO <=4 SENSITIVE Sensitive     CEFEPIME 2 SENSITIVE Sensitive     * >=100,000 COLONIES/mL PSEUDOMONAS AERUGINOSA         Radiology Studies: DG Chest Port 1 View  Result Date: 10/19/2020 CLINICAL DATA:  65 year old female with shortness of breath. EXAM: PORTABLE CHEST 1 VIEW COMPARISON:   Portable chest 10/18/2020 and earlier. FINDINGS: Portable AP semi upright view at 0713 hours. Continued lower lung volumes than baseline compared to last month. Mediastinal contours remain normal. Visualized tracheal air column is within normal limits. No pneumothorax. No pleural effusion. Mild crowding of lung markings including along the right minor fissure is stable since 10/15/2020. Paucity of bowel gas in the upper abdomen. No acute osseous abnormality identified. IMPRESSION: Stable low lung volumes and suspected mild atelectasis. No new cardiopulmonary abnormality. Electronically Signed   By: Genevie Ann M.D.   On: 10/19/2020 08:00        Scheduled Meds: . Chlorhexidine Gluconate Cloth  6 each Topical Q0600  . ciprofloxacin  750 mg Oral q1800  . metoprolol tartrate  12.5 mg Oral BID  . multivitamin with minerals  1 tablet Oral Daily  . psyllium  1 packet Oral Daily  . [START ON 10/21/2020] sodium bicarbonate  650 mg Oral Daily  . traZODone  25 mg Oral QHS   Continuous Infusions:    LOS: 4 days   Time spent: 43mins Greater than 50% of this time was spent in counseling, explanation of diagnosis, planning  of further management, and coordination of care.   Voice Recognition Viviann Spare dictation system was used to create this note, attempts have been made to correct errors. Please contact the author with questions and/or clarifications.   Florencia Reasons, MD PhD FACP [facility]  Available via Epic secure chat 7am-7pm for nonurgent issues Please page for urgent issues To page the attending provider between 7A-7P or the covering provider during after hours 7P-7A, please log into the web site [email or web address] and access using universal [place] password for that web site. If you do not have the password, please call the hospital operator.    10/20/2020, 6:12 PM

## 2020-10-21 DIAGNOSIS — N179 Acute kidney failure, unspecified: Secondary | ICD-10-CM | POA: Diagnosis not present

## 2020-10-21 LAB — CULTURE, BLOOD (ROUTINE X 2)
Special Requests: ADEQUATE
Special Requests: ADEQUATE

## 2020-10-21 LAB — BASIC METABOLIC PANEL
Anion gap: 7 (ref 5–15)
BUN: 25 mg/dL — ABNORMAL HIGH (ref 8–23)
CO2: 25 mmol/L (ref 22–32)
Calcium: 8.3 mg/dL — ABNORMAL LOW (ref 8.9–10.3)
Chloride: 105 mmol/L (ref 98–111)
Creatinine, Ser: 2.67 mg/dL — ABNORMAL HIGH (ref 0.44–1.00)
GFR, Estimated: 19 mL/min — ABNORMAL LOW (ref >60)
Glucose, Bld: 100 mg/dL — ABNORMAL HIGH (ref 70–99)
Potassium: 4 mmol/L (ref 3.5–5.1)
Sodium: 137 mmol/L (ref 135–145)

## 2020-10-21 LAB — CBC
HCT: 30 % — ABNORMAL LOW (ref 36.0–46.0)
Hemoglobin: 9.2 g/dL — ABNORMAL LOW (ref 12.0–15.0)
MCH: 26.6 pg (ref 26.0–34.0)
MCHC: 30.7 g/dL (ref 30.0–36.0)
MCV: 86.7 fL (ref 80.0–100.0)
Platelets: 117 10*3/uL — ABNORMAL LOW (ref 150–400)
RBC: 3.46 MIL/uL — ABNORMAL LOW (ref 3.87–5.11)
RDW: 14.1 % (ref 11.5–15.5)
WBC: 10.4 10*3/uL (ref 4.0–10.5)
nRBC: 0 % (ref 0.0–0.2)

## 2020-10-21 MED ORDER — SACCHAROMYCES BOULARDII 250 MG PO CAPS: 250.0000 mg | ORAL_CAPSULE | Freq: Two times a day (BID) | ORAL | 0 refills | Status: AC

## 2020-10-21 MED ORDER — CIPROFLOXACIN HCL 500 MG PO TABS: 500.0000 mg | ORAL_TABLET | Freq: Two times a day (BID) | ORAL | Status: DC

## 2020-10-21 MED ORDER — CIPROFLOXACIN HCL 500 MG PO TABS: 500.0000 mg | ORAL_TABLET | Freq: Two times a day (BID) | ORAL | 0 refills | Status: DC

## 2020-10-21 MED ORDER — SODIUM BICARBONATE 650 MG PO TABS: 650.0000 mg | ORAL_TABLET | Freq: Every day | ORAL | 0 refills | Status: AC

## 2020-10-21 NOTE — Progress Notes (Signed)
Patient's [facility] from [facility]. [facility]. Sent message to [facility] pharmacist.   Fuller Mandril, RN

## 2020-10-21 NOTE — Plan of Care (Signed)
  Problem: Health Behavior/Discharge Planning: Goal: Ability to manage health-related needs will improve Outcome: Progressing   

## 2020-10-21 NOTE — Progress Notes (Addendum)
Brief pharmacy note:  Ciprofloxacin 500mg  bid x 7 days called in the [facility], PharmD, BCPS Clinical Pharmacist 10/21/2020 10:48 AM   Addendum - per pt request [place] and had them transfer [facility] [place]

## 2020-10-21 NOTE — Progress Notes (Signed)
[facility]  ROUNDING NOTE   Subjective:   Patient resting in bed Husband at bedside Patient states she feels well today No complaints at this time  Objective:  Vital signs in last 24 hours:  Temp:  [98.1 F (36.7 C)-99.3 F (37.4 C)] 98.2 F (36.8 C) (05/02 1123) Pulse Rate:  [89-106] 89 (05/02 1123) Resp:  [18-20] 19 (05/02 1123) BP: (107-137)/(52-68) 137/63 (05/02 1123) SpO2:  [96 %-99 %] 97 % (05/02 1123)  Weight change:  Filed Weights   10/15/20 1156 10/20/20 0617  Weight: 108.9 kg 120 kg    Intake/Output: I/O last 3 completed shifts: In: 1762.3 [P.O.:1340; I.V.:327.3; IV Piggyback:95] Out: 2355 [DDUKG:2542]   Intake/Output this shift:  Total I/O In: 240 [P.O.:240] Out: -   Physical Exam: General: Resting in bed, in no acute distress  Head:  Moist oral mucosal membranes  Eyes: Anicteric  Lungs:  Lungs clear bilaterally, normal respiratory effort   Heart: S1S2,no rubs or gallops  Abdomen:  Soft, nontender, non distended  Extremities:  No peripheral edema.  Neurologic: Able to answer simple questions appropriately  Skin: No acute lesions or rashes       Basic Metabolic Panel: Recent Labs  Lab 10/15/20 2135 10/16/20 0603 10/18/20 0430 10/19/20 0520 10/19/20 1447 10/20/20 0356 10/21/20 0331  NA  --    < > 140 138 138 138 137  K  --    < > 4.2 5.0 4.6 4.6 4.0  CL  --    < > 108 107 105 106 105  CO2  --    < > 20* 23 25 24 25   GLUCOSE  --    < > 121* 107* 121* 93 100*  BUN  --    < > 26* 26* 27* 25* 25*  CREATININE  --    < > 3.89* 3.47* 3.38* 3.12* 2.67*  CALCIUM  --    < > 8.7* 8.0* 8.0* 8.1* 8.3*  MG 2.3  --   --   --   --   --   --    < > = values in this interval not displayed.    Liver Function Tests: Recent Labs  Lab 10/15/20 1514 10/19/20 0520  AST 18 41  ALT 10 20  ALKPHOS 111 118  BILITOT 0.8 0.6  PROT 7.1 6.0*  ALBUMIN 2.9* 2.3*   No results for input(s): LIPASE, AMYLASE in the last 168 hours. No results for  input(s): AMMONIA in the last 168 hours.  CBC: Recent Labs  Lab 10/15/20 1249 10/16/20 0603 10/17/20 0353 10/19/20 0520 10/20/20 0356 10/21/20 0331  WBC 8.2 9.0 7.4 17.1* 11.6* 10.4  NEUTROABS 6.2  --  5.1 14.6* 9.5*  --   HGB 11.4* 10.9* 10.2* 9.0* 9.1* 9.2*  HCT 36.6 34.8* 32.6* 28.9* 29.9* 30.0*  MCV 85.7 84.3 84.7 86.3 86.9 86.7  PLT 356 375 347 187 105* 117*    Cardiac Enzymes: No results for input(s): CKTOTAL, CKMB, CKMBINDEX, TROPONINI in the last 168 hours.  BNP: Invalid input(s): POCBNP  CBG: No results for input(s): GLUCAP in the last 168 hours.  Microbiology: Results for orders placed or performed during the hospital encounter of 10/15/20  Resp Panel by RT-PCR (Flu A&B, Covid) Nasopharyngeal Swab     Status: None   Collection Time: 10/15/20 12:20 PM   Specimen: Nasopharyngeal Swab; Nasopharyngeal(NP) swabs in vial transport medium  Result Value Ref Range Status   SARS Coronavirus 2 by RT PCR NEGATIVE NEGATIVE Final  Comment: (NOTE) SARS-CoV-2 target nucleic acids are NOT DETECTED.  The SARS-CoV-2 RNA is generally detectable in upper respiratory specimens during the acute phase of infection. The lowest concentration of SARS-CoV-2 viral copies this assay can detect is 138 copies/mL. A negative result does not preclude SARS-Cov-2 infection and should not be used as the sole basis for treatment or other patient management decisions. A negative result may occur with  improper specimen collection/handling, submission of specimen other than nasopharyngeal swab, presence of viral mutation(s) within the areas targeted by this assay, and inadequate number of viral copies(<138 copies/mL). A negative result must be combined with clinical observations, patient history, and epidemiological information. The expected result is Negative.  Fact Sheet for Patients:  EntrepreneurPulse.com.au  Fact Sheet for Healthcare Providers:   IncredibleEmployment.be  This test is no t yet approved or cleared by the [place] FDA and  has been authorized for detection and/or diagnosis of SARS-CoV-2 by FDA under an Emergency Use Authorization (EUA). This EUA will remain  in effect (meaning this test can be used) for the duration of the COVID-19 declaration under Section 564(b)(1) of the Act, 21 U.S.C.section 360bbb-3(b)(1), unless the authorization is terminated  or revoked sooner.       Influenza A by PCR NEGATIVE NEGATIVE Final   Influenza B by PCR NEGATIVE NEGATIVE Final    Comment: (NOTE) The Xpert Xpress SARS-CoV-2/FLU/RSV plus assay is intended as an aid in the diagnosis of influenza from Nasopharyngeal swab specimens and should not be used as a sole basis for treatment. Nasal washings and aspirates are unacceptable for Xpert Xpress SARS-CoV-2/FLU/RSV testing.  Fact Sheet for Patients: EntrepreneurPulse.com.au  Fact Sheet for Healthcare Providers: IncredibleEmployment.be  This test is not yet approved or cleared by the [place] FDA and has been authorized for detection and/or diagnosis of SARS-CoV-2 by FDA under an Emergency Use Authorization (EUA). This EUA will remain in effect (meaning this test can be used) for the duration of the COVID-19 declaration under Section 564(b)(1) of the Act, 21 U.S.C. section 360bbb-3(b)(1), unless the authorization is terminated or revoked.  Performed at [facility], [address] [place], [place] [address]   CULTURE, BLOOD (ROUTINE X 2) w Reflex to ID Panel     Status: Abnormal   Collection Time: 10/18/20  8:08 AM   Specimen: BLOOD  Result Value Ref Range Status   Specimen Description   Final    BLOOD BLOOD LEFT HAND Performed at [facility], [address], [place], [place] [address]    Special Requests   Final    BOTTLES DRAWN AEROBIC AND ANAEROBIC Blood Culture adequate  volume Performed at [facility], [address], [place], [place] [address]    Culture  Setup Time   Final    GRAM NEGATIVE RODS AEROBIC BOTTLE ONLY CRITICAL RESULT CALLED TO, READ BACK BY AND VERIFIED WITH: JASON ROBBINS AT 2246 10/18/20 MF Performed at [facility], [place] [address] [place], [place] [address]    Culture PSEUDOMONAS AERUGINOSA (A)  Final   Report Status 10/21/2020 FINAL  Final   Organism ID, Bacteria PSEUDOMONAS AERUGINOSA  Final      Susceptibility   Pseudomonas aeruginosa - MIC*    CEFTAZIDIME 4 SENSITIVE Sensitive     CIPROFLOXACIN <=0.25 SENSITIVE Sensitive     GENTAMICIN <=1 SENSITIVE Sensitive     IMIPENEM <=0.25 SENSITIVE Sensitive     PIP/TAZO <=4 SENSITIVE Sensitive     CEFEPIME 2 SENSITIVE Sensitive     *  PSEUDOMONAS AERUGINOSA  Blood Culture ID Panel (Reflexed)     Status: Abnormal   Collection Time: 10/18/20  8:08 AM  Result Value Ref Range Status   Enterococcus faecalis NOT DETECTED NOT DETECTED Final   Enterococcus Faecium NOT DETECTED NOT DETECTED Final   Listeria monocytogenes NOT DETECTED NOT DETECTED Final   Staphylococcus species NOT DETECTED NOT DETECTED Final   Staphylococcus aureus (BCID) NOT DETECTED NOT DETECTED Final   Staphylococcus epidermidis NOT DETECTED NOT DETECTED Final   Staphylococcus lugdunensis NOT DETECTED NOT DETECTED Final   Streptococcus species NOT DETECTED NOT DETECTED Final   Streptococcus agalactiae NOT DETECTED NOT DETECTED Final   Streptococcus pneumoniae NOT DETECTED NOT DETECTED Final   Streptococcus pyogenes NOT DETECTED NOT DETECTED Final   A.calcoaceticus-baumannii NOT DETECTED NOT DETECTED Final   Bacteroides fragilis NOT DETECTED NOT DETECTED Final   Enterobacterales NOT DETECTED NOT DETECTED Final   Enterobacter cloacae complex NOT DETECTED NOT DETECTED Final   Escherichia coli NOT DETECTED NOT DETECTED Final   Klebsiella aerogenes NOT DETECTED NOT DETECTED Final   Klebsiella oxytoca NOT  DETECTED NOT DETECTED Final   Klebsiella pneumoniae NOT DETECTED NOT DETECTED Final   Proteus species NOT DETECTED NOT DETECTED Final   Salmonella species NOT DETECTED NOT DETECTED Final   Serratia marcescens NOT DETECTED NOT DETECTED Final   Haemophilus influenzae NOT DETECTED NOT DETECTED Final   Neisseria meningitidis NOT DETECTED NOT DETECTED Final   Pseudomonas aeruginosa DETECTED (A) NOT DETECTED Final    Comment: CRITICAL RESULT CALLED TO, READ BACK BY AND VERIFIED WITH: JASON ROBBINS AT 2248 10/18/20 MF    Stenotrophomonas maltophilia NOT DETECTED NOT DETECTED Final   Candida albicans NOT DETECTED NOT DETECTED Final   Candida auris NOT DETECTED NOT DETECTED Final   Candida glabrata NOT DETECTED NOT DETECTED Final   Candida krusei NOT DETECTED NOT DETECTED Final   Candida parapsilosis NOT DETECTED NOT DETECTED Final   Candida tropicalis NOT DETECTED NOT DETECTED Final   Cryptococcus neoformans/gattii NOT DETECTED NOT DETECTED Final   CTX-M ESBL NOT DETECTED NOT DETECTED Final   Carbapenem resistance IMP NOT DETECTED NOT DETECTED Final   Carbapenem resistance KPC NOT DETECTED NOT DETECTED Final   Carbapenem resistance NDM NOT DETECTED NOT DETECTED Final   Carbapenem resistance VIM NOT DETECTED NOT DETECTED Final    Comment: Performed at [facility], [address] [place], [place] [address]  CULTURE, BLOOD (ROUTINE X 2) w Reflex to ID Panel     Status: Abnormal   Collection Time: 10/18/20  8:20 AM   Specimen: BLOOD  Result Value Ref Range Status   Specimen Description   Final    BLOOD BLOOD RIGHT ARM Performed at [facility], [address] [place], [place] [address]    Special Requests   Final    BOTTLES DRAWN AEROBIC AND ANAEROBIC Blood Culture adequate volume Performed at [facility], [address] [place], [place] [address]    Culture  Setup Time   Final    GRAM NEGATIVE RODS AEROBIC BOTTLE ONLY CRITICAL VALUE NOTED.  VALUE IS  CONSISTENT WITH PREVIOUSLY REPORTED AND CALLED VALUE. Performed at [facility], [address] [place], [place] [address]    Culture (A)  Final    PSEUDOMONAS AERUGINOSA SUSCEPTIBILITIES PERFORMED ON PREVIOUS CULTURE WITHIN THE LAST 5 DAYS. Performed at [facility], [place] [address], [place],  [address]    Report Status 10/21/2020 FINAL  Final  Urine Culture  Status: Abnormal   Collection Time: 10/18/20  8:38 AM   Specimen: Urine, Random  Result Value Ref Range Status   Specimen Description   Final    URINE, RANDOM Performed at [facility], [address] [place], [place] [address]    Special Requests   Final    NONE Performed at [facility], [place], [place] [address]    Culture >=100,000 COLONIES/mL PSEUDOMONAS AERUGINOSA (A)  Final   Report Status 10/20/2020 FINAL  Final   Organism ID, Bacteria PSEUDOMONAS AERUGINOSA (A)  Final      Susceptibility   Pseudomonas aeruginosa - MIC*    CEFTAZIDIME 4 SENSITIVE Sensitive     CIPROFLOXACIN <=0.25 SENSITIVE Sensitive     GENTAMICIN <=1 SENSITIVE Sensitive     IMIPENEM 1 SENSITIVE Sensitive     PIP/TAZO <=4 SENSITIVE Sensitive     CEFEPIME 2 SENSITIVE Sensitive     * >=100,000 COLONIES/mL PSEUDOMONAS AERUGINOSA    Coagulation Studies: No results for input(s): LABPROT, INR in the last 72 hours.  Urinalysis: No results for input(s): COLORURINE, LABSPEC, PHURINE, GLUCOSEU, HGBUR, BILIRUBINUR, KETONESUR, PROTEINUR, UROBILINOGEN, NITRITE, LEUKOCYTESUR in the last 72 hours.  Invalid input(s): APPERANCEUR    Imaging: No results found.   Medications:    . ciprofloxacin  500 mg Oral BID  . metoprolol tartrate  12.5 mg Oral BID  . multivitamin with minerals  1 tablet Oral Daily  . psyllium  1 packet Oral Daily  . saccharomyces boulardii  250 mg Oral BID  . sodium bicarbonate  650 mg Oral Daily  . traZODone  25 mg Oral QHS   labetalol, ondansetron **OR**  ondansetron (ZOFRAN) IV  Assessment/ Plan:  [name] is a 65 y.o.  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, presented to the emergency department at the request of cancer center for abnormal labs, aki, likely obstructive nephropathy  Acute kidney injury #Bilateral hydronephrosis status post stent placement 10/10/2020 Baseline creatinine 0.90 from September 04, 2020  Renal function improved D/c IVF Will follow up with Dr Candiss Norse as outpatient   Lab Results  Component Value Date   CREATININE 2.67 (H) 10/21/2020   CREATININE 3.12 (H) 10/20/2020   CREATININE 3.38 (H) 10/19/2020    Intake/Output Summary (Last 24 hours) at 10/21/2020 1325 Last data filed at 10/21/2020 0900 Gross per 24 hour  Intake 380 ml  Output --  Net 380 ml    #Hypertension Improved BP and pulse control Metoprolol 12.5mg    LOS: 5 Delontae Lamm 5/2/20221:25 PM

## 2020-10-21 NOTE — Progress Notes (Signed)
Called Mr. Iwata to report medications have been called in again to [facility].   Fuller Mandril, RN

## 2020-10-21 NOTE — Progress Notes (Signed)
Urology Inpatient Progress Note  Subjective: No acute events overnight.  Foley catheter discontinued yesterday. Creatinine down today, 2.67.  WBC count down today, 10.4.  Urine and blood cultures positive for pansensitive Pseudomonas.  On antibiotics as below. Patient reports feeling well today without pain.  Anti-infectives: Anti-infectives (From admission, onward)   Start     Dose/Rate Route Frequency Ordered Stop   10/20/20 1800  ciprofloxacin (CIPRO) tablet 750 mg        750 mg Oral Daily-1800 10/20/20 1331     10/20/20 1200  piperacillin-tazobactam (ZOSYN) IVPB 3.375 g  Status:  Discontinued        3.375 g 12.5 mL/hr over 240 Minutes Intravenous Every 8 hours 10/20/20 0711 10/20/20 1331   10/19/20 1800  piperacillin-tazobactam (ZOSYN) IVPB 2.25 g  Status:  Discontinued        2.25 g 100 mL/hr over 30 Minutes Intravenous Every 6 hours 10/19/20 1230 10/19/20 1231   10/19/20 1800  piperacillin-tazobactam (ZOSYN) 2.25 g in sodium chloride 0.9 % 50 mL IVPB  Status:  Discontinued        2.25 g 100 mL/hr over 30 Minutes Intravenous Every 6 hours 10/19/20 1232 10/20/20 0711   10/18/20 1200  piperacillin-tazobactam (ZOSYN) 2.25 g in sodium chloride 0.9 % 50 mL IVPB  Status:  Discontinued        2.25 g 100 mL/hr over 30 Minutes Intravenous Every 6 hours 10/18/20 1043 10/18/20 1052   10/18/20 1200  piperacillin-tazobactam (ZOSYN) IVPB 2.25 g  Status:  Discontinued        2.25 g 100 mL/hr over 30 Minutes Intravenous Every 6 hours 10/18/20 1052 10/19/20 1232      Current Facility-Administered Medications  Medication Dose Route Frequency Provider Last Rate Last Admin  . ciprofloxacin (CIPRO) tablet 750 mg  750 mg Oral q1800 Florencia Reasons, MD   750 mg at 10/20/20 1814  . labetalol (NORMODYNE) injection 10 mg  10 mg Intravenous Q4H PRN Sharion Settler, NP   10 mg at 10/18/20 0427  . metoprolol tartrate (LOPRESSOR) tablet 12.5 mg  12.5 mg Oral BID Florencia Reasons, MD   12.5 mg at 10/20/20 2238  .  multivitamin with minerals tablet 1 tablet  1 tablet Oral Daily Cox, Amy N, DO   1 tablet at 10/20/20 0843  . ondansetron (ZOFRAN) tablet 4 mg  4 mg Oral Q6H PRN Cox, Amy N, DO       Or  . ondansetron (ZOFRAN) injection 4 mg  4 mg Intravenous Q6H PRN Cox, Amy N, DO   4 mg at 10/18/20 0641  . psyllium (HYDROCIL/METAMUCIL) 1 packet  1 packet Oral Daily Florencia Reasons, MD   1 packet at 10/19/20 1005  . saccharomyces boulardii (FLORASTOR) capsule 250 mg  250 mg Oral BID Florencia Reasons, MD   250 mg at 10/20/20 2238  . sodium bicarbonate tablet 650 mg  650 mg Oral Daily Florencia Reasons, MD      . traZODone (DESYREL) tablet 25 mg  25 mg Oral QHS Sharion Settler, NP   25 mg at 10/20/20 2238   Objective: Vital signs in last 24 hours: Temp:  [98.1 F (36.7 C)-99.3 F (37.4 C)] 98.1 F (36.7 C) (05/02 0413) Pulse Rate:  [94-106] 106 (05/02 0413) Resp:  [18-20] 20 (05/02 0413) BP: (107-123)/(30-68) 120/60 (05/02 0413) SpO2:  [96 %-100 %] 97 % (05/02 0413)  Intake/Output from previous day: 05/01 0701 - 05/02 0700 In: 140 [P.O.:140] Out: 425 [Urine:425] Intake/Output this shift: No intake/output data  recorded.  Physical Exam Vitals and nursing note reviewed.  Constitutional:      General: She is not in acute distress.    Appearance: She is not ill-appearing, toxic-appearing or diaphoretic.  HENT:     Head: Normocephalic and atraumatic.  Pulmonary:     Effort: Pulmonary effort is normal. No respiratory distress.  Skin:    General: Skin is warm and dry.  Neurological:     Mental Status: She is alert and oriented to person, place, and time.  Psychiatric:        Mood and Affect: Mood normal.        Behavior: Behavior normal.    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  Creatinine continues to downtrend following Foley catheter removal.  No indication to replace at this time.  Recommend continued close monitoring, given clinical improvement, okay for discharge from urologic perspective on culture appropriate antibiotics.  Debroah Loop, PA-C 10/21/2020

## 2020-10-21 NOTE — Discharge Summary (Signed)
[facility] NAME: Tracy Goodman    MR#:  [id number]  DATE OF BIRTH:  1956-06-19  DATE OF ADMISSION:  10/15/2020 ADMITTING PHYSICIAN: Tracy Reasons, MD  DATE OF DISCHARGE:10/21/2020  PRIMARY CARE PHYSICIAN: Tracy Noe, MD    ADMISSION DIAGNOSIS:  AKI (acute kidney injury) ([place]) [N17.9] Acute kidney failure due to procedure [N99.0]  DISCHARGE DIAGNOSIS:  Sepsis/Pseudomonas UTI/Pseudomonas bacteremia AKI/metabolic acidosis suspected due to Bilateral hydronephrosis /obstructive nephropathy status post bilateral stent placement on 4/21 Newly diagnosed metastatic high-grade serous carcinoma of GYN  SECONDARY DIAGNOSIS:   Past Medical History:  Diagnosis Date  . Diverticulitis   . Hypertension   . 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  Sepsis/Pseudomonas UTI/Pseudomonas bacteremia--POA -recent bilateral stent placement on 4/21 for bilateral hydronephrosis from cancer obstruction -She developed fever on 4/29 AM, with significant sinus tachycardia her heart rate went to 150, hypotension systolic blood pressure in the 80s, with lactic acidosis -Received multiple fluids bolus, blood culture and urine culture grew Pseudomonas, chest x-ray no acute findings -Empiric started on  Zosyn on 4/29, improved, case discussed with infectious disease Dr. Gale Journey and urology Dr. Louis Meckel, both recommend Cipro treatment,  -Patient will need total of 10 days antibiotic treatment--d/w PHarmacy and will d/c on po cipro 500 mg bid fro remaining days since her renal function is gradually improving. 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 confirmed with pt she is DNR (had purple bracelet in house) CONSULTS OBTAINED:  Treatment Team:  Sindy Guadeloupe, MD Hollice Espy, MD  DRUG ALLERGIES:  No Known Allergies  DISCHARGE MEDICATIONS:   Allergies as of 10/21/2020   No Known Allergies     Medication List    STOP taking these medications   atorvastatin 10 MG tablet Commonly known as: LIPITOR     TAKE these medications   acetaminophen 325 MG tablet Commonly known as: TYLENOL Take 2 tablets (650 mg total) by mouth every 6 (six) hours as needed for mild pain, fever or headache (or Fever >/= 101).   amLODipine 10 MG tablet Commonly known as: NORVASC Take 1 tablet (10 mg total) by mouth daily.   ciprofloxacin 500 MG tablet Commonly known as: CIPRO Take 1 tablet (500 mg total) by mouth 2 (two) times daily for 14 doses.   saccharomyces boulardii 250 MG capsule Commonly known as: FLORASTOR Take 1 capsule (250 mg total) by mouth 2 (two)  times daily for 7 days.   sodium bicarbonate 650  MG tablet Take 1 tablet (650 mg total) by mouth daily for 3 days. Start taking on: Oct 22, 2020       If you experience worsening of your admission symptoms, develop shortness of breath, life threatening emergency, suicidal or homicidal thoughts you must seek medical attention immediately by calling 911 or calling your MD immediately  if symptoms less severe.  You Must read complete instructions/literature along with all the possible adverse reactions/side effects for all the Medicines you take and that have been prescribed to you. Take any new Medicines after you have completely understood and accept all the possible adverse reactions/side effects.   Please note  You were cared for by a hospitalist during your hospital stay. If you have any questions about your discharge medications or the care you received while you were in the hospital after you are discharged, you can call the unit and asked to speak with the hospitalist on call if the hospitalist that took care of you is not available. Once you are discharged, your primary care physician will handle any further medical issues. Please note that NO REFILLS for any discharge medications will be authorized once you are discharged, as it is imperative that you return to your primary care physician (or establish a relationship with a primary care physician if you do not have one) for your aftercare needs so that they can reassess your need for medications and monitor your lab values. Today   SUBJECTIVE   Doing well Family at bedside  VITAL SIGNS:  Blood pressure 121/62, pulse (!) 106, temperature 98.3 F (36.8 C), temperature source Oral, resp. rate 20, height 4\' 11"  (1.499 m), weight 120 kg, SpO2 97 %.  I/O:    Intake/Output Summary (Last 24 hours) at 10/21/2020 1004 Last data filed at 10/21/2020 0900 Gross per 24 hour  Intake 380 ml  Output 425 ml  Net -45 ml    PHYSICAL EXAMINATION:  GENERAL:  65 y.o.-year-old patient lying in the  bed with no acute distress.  LUNGS: Normal breath sounds bilaterally, no wheezing, rales,rhonchi or crepitation. No use of accessory muscles of respiration.  CARDIOVASCULAR: S1, S2 normal. No murmurs, rubs, or gallops.  ABDOMEN: Soft, non-tender, non-distended. Bowel sounds present. No organomegaly or mass.  EXTREMITIES: No pedal edema, cyanosis, or clubbing.  NEUROLOGIC: Cranial nerves II through XII are intact. Muscle strength 5/5 in all extremities. Sensation intact. Gait not checked.  PSYCHIATRIC: The patient is alert and oriented x 3.  SKIN: No obvious rash, lesion, or ulcer.   DATA REVIEW:   CBC  Recent Labs  Lab 10/21/20 0331  WBC 10.4  HGB 9.2*  HCT 30.0*  PLT 117*    Chemistries  Recent Labs  Lab 10/15/20 2135 10/16/20 0603 10/19/20 0520 10/19/20 1447 10/21/20 0331  NA  --    < > 138   < > 137  K  --    < > 5.0   < > 4.0  CL  --    < > 107   < > 105  CO2  --    < > 23   < > 25  GLUCOSE  --    < > 107*   < > 100*  BUN  --    < > 26*   < > 25*  CREATININE  --    < > 3.47*   < > 2.67*  CALCIUM  --    < >  8.0*   < > 8.3*  MG 2.3  --   --   --   --   AST  --   --  41  --   --   ALT  --   --  20  --   --   ALKPHOS  --   --  118  --   --   BILITOT  --   --  0.6  --   --    < > = values in this interval not displayed.    Microbiology Results   Recent Results (from the past 240 hour(s))  Resp Panel by RT-PCR (Flu A&B, Covid) Nasopharyngeal Swab     Status: None   Collection Time: 10/15/20 12:20 PM   Specimen: Nasopharyngeal Swab; Nasopharyngeal(NP) swabs in vial transport medium  Result Value Ref Range Status   SARS Coronavirus 2 by RT PCR NEGATIVE NEGATIVE Final    Comment: (NOTE) SARS-CoV-2 target nucleic acids are NOT DETECTED.  The SARS-CoV-2 RNA is generally detectable in upper respiratory specimens during the acute phase of infection. The lowest concentration of SARS-CoV-2 viral copies this assay can detect is 138 copies/mL. A negative result does not  preclude SARS-Cov-2 infection and should not be used as the sole basis for treatment or other patient management decisions. A negative result may occur with  improper specimen collection/handling, submission of specimen other than nasopharyngeal swab, presence of viral mutation(s) within the areas targeted by this assay, and inadequate number of viral copies(<138 copies/mL). A negative result must be combined with clinical observations, patient history, and epidemiological information. The expected result is Negative.  Fact Sheet for Patients:  EntrepreneurPulse.com.au  Fact Sheet for Healthcare Providers:  IncredibleEmployment.be  This test is no t yet approved or cleared by the [place] FDA and  has been authorized for detection and/or diagnosis of SARS-CoV-2 by FDA under an Emergency Use Authorization (EUA). This EUA will remain  in effect (meaning this test can be used) for the duration of the COVID-19 declaration under Section 564(b)(1) of the Act, 21 U.S.C.section 360bbb-3(b)(1), unless the authorization is terminated  or revoked sooner.       Influenza A by PCR NEGATIVE NEGATIVE Final   Influenza B by PCR NEGATIVE NEGATIVE Final    Comment: (NOTE) The Xpert Xpress SARS-CoV-2/FLU/RSV plus assay is intended as an aid in the diagnosis of influenza from Nasopharyngeal swab specimens and should not be used as a sole basis for treatment. Nasal washings and aspirates are unacceptable for Xpert Xpress SARS-CoV-2/FLU/RSV testing.  Fact Sheet for Patients: EntrepreneurPulse.com.au  Fact Sheet for Healthcare Providers: IncredibleEmployment.be  This test is not yet approved or cleared by the [place] FDA and has been authorized for detection and/or diagnosis of SARS-CoV-2 by FDA under an Emergency Use Authorization (EUA). This EUA will remain in effect (meaning this test can be used) for the  duration of the COVID-19 declaration under Section 564(b)(1) of the Act, 21 U.S.C. section 360bbb-3(b)(1), unless the authorization is terminated or revoked.  Performed at [facility], [address] [place], [place] [address]   CULTURE, BLOOD (ROUTINE X 2) w Reflex to ID Panel     Status: Abnormal   Collection Time: 10/18/20  8:08 AM   Specimen: BLOOD  Result Value Ref Range Status   Specimen Description   Final    BLOOD BLOOD LEFT HAND Performed at [facility], [address], [place], [place] [address]    Special Requests   Final  BOTTLES DRAWN AEROBIC AND ANAEROBIC Blood Culture adequate volume Performed at [facility], [address] [place], [place] [address]    Culture  Setup Time   Final    GRAM NEGATIVE RODS AEROBIC BOTTLE ONLY CRITICAL RESULT CALLED TO, READ BACK BY AND VERIFIED WITH: JASON ROBBINS AT 2246 10/18/20 MF Performed at [facility], [place] [address] [place], [place] [address]    Culture PSEUDOMONAS AERUGINOSA (A)  Final   Report Status 10/21/2020 FINAL  Final   Organism ID, Bacteria PSEUDOMONAS AERUGINOSA  Final      Susceptibility   Pseudomonas aeruginosa - MIC*    CEFTAZIDIME 4 SENSITIVE Sensitive     CIPROFLOXACIN <=0.25 SENSITIVE Sensitive     GENTAMICIN <=1 SENSITIVE Sensitive     IMIPENEM <=0.25 SENSITIVE Sensitive     PIP/TAZO <=4 SENSITIVE Sensitive     CEFEPIME 2 SENSITIVE Sensitive     * PSEUDOMONAS AERUGINOSA  Blood Culture ID Panel (Reflexed)     Status: Abnormal   Collection Time: 10/18/20  8:08 AM  Result Value Ref Range Status   Enterococcus faecalis NOT DETECTED NOT DETECTED Final   Enterococcus Faecium NOT DETECTED NOT DETECTED Final   Listeria monocytogenes NOT DETECTED NOT DETECTED Final   Staphylococcus species NOT DETECTED NOT DETECTED Final   Staphylococcus aureus (BCID) NOT DETECTED NOT DETECTED Final   Staphylococcus epidermidis NOT DETECTED NOT DETECTED Final   Staphylococcus  lugdunensis NOT DETECTED NOT DETECTED Final   Streptococcus species NOT DETECTED NOT DETECTED Final   Streptococcus agalactiae NOT DETECTED NOT DETECTED Final   Streptococcus pneumoniae NOT DETECTED NOT DETECTED Final   Streptococcus pyogenes NOT DETECTED NOT DETECTED Final   A.calcoaceticus-baumannii NOT DETECTED NOT DETECTED Final   Bacteroides fragilis NOT DETECTED NOT DETECTED Final   Enterobacterales NOT DETECTED NOT DETECTED Final   Enterobacter cloacae complex NOT DETECTED NOT DETECTED Final   Escherichia coli NOT DETECTED NOT DETECTED Final   Klebsiella aerogenes NOT DETECTED NOT DETECTED Final   Klebsiella oxytoca NOT DETECTED NOT DETECTED Final   Klebsiella pneumoniae NOT DETECTED NOT DETECTED Final   Proteus species NOT DETECTED NOT DETECTED Final   Salmonella species NOT DETECTED NOT DETECTED Final   Serratia marcescens NOT DETECTED NOT DETECTED Final   Haemophilus influenzae NOT DETECTED NOT DETECTED Final   Neisseria meningitidis NOT DETECTED NOT DETECTED Final   Pseudomonas aeruginosa DETECTED (A) NOT DETECTED Final    Comment: CRITICAL RESULT CALLED TO, READ BACK BY AND VERIFIED WITH: JASON ROBBINS AT 2248 10/18/20 MF    Stenotrophomonas maltophilia NOT DETECTED NOT DETECTED Final   Candida albicans NOT DETECTED NOT DETECTED Final   Candida auris NOT DETECTED NOT DETECTED Final   Candida glabrata NOT DETECTED NOT DETECTED Final   Candida krusei NOT DETECTED NOT DETECTED Final   Candida parapsilosis NOT DETECTED NOT DETECTED Final   Candida tropicalis NOT DETECTED NOT DETECTED Final   Cryptococcus neoformans/gattii NOT DETECTED NOT DETECTED Final   CTX-M ESBL NOT DETECTED NOT DETECTED Final   Carbapenem resistance IMP NOT DETECTED NOT DETECTED Final   Carbapenem resistance KPC NOT DETECTED NOT DETECTED Final   Carbapenem resistance NDM NOT DETECTED NOT DETECTED Final   Carbapenem resistance VIM NOT DETECTED NOT DETECTED Final    Comment: Performed at [facility], [address] [place], [place] [address]  CULTURE, BLOOD (ROUTINE X 2) w Reflex to ID Panel     Status: Abnormal   Collection Time: 10/18/20  8:20 AM   Specimen:  BLOOD  Result Value Ref Range Status   Specimen Description   Final    BLOOD BLOOD RIGHT ARM Performed at [facility], [address] [place], [place] [address]    Special Requests   Final    BOTTLES DRAWN AEROBIC AND ANAEROBIC Blood Culture adequate volume Performed at [facility], [address] [place], [place] [address]    Culture  Setup Time   Final    GRAM NEGATIVE RODS AEROBIC BOTTLE ONLY CRITICAL VALUE NOTED.  VALUE IS CONSISTENT WITH PREVIOUSLY REPORTED AND CALLED VALUE. Performed at [facility], [address] [place], [place] [address]    Culture (A)  Final    PSEUDOMONAS AERUGINOSA SUSCEPTIBILITIES PERFORMED ON PREVIOUS CULTURE WITHIN THE LAST 5 DAYS. Performed at [facility], [place] [address] [place], [place] [address]    Report Status 10/21/2020 FINAL  Final  Urine Culture     Status: Abnormal   Collection Time: 10/18/20  8:38 AM   Specimen: Urine, Random  Result Value Ref Range Status   Specimen Description   Final    URINE, RANDOM Performed at [facility], [address] [place], [place] [address]    Special Requests   Final    NONE Performed at [facility], [address] [place], [place] [address]    Culture >=100,000 COLONIES/mL PSEUDOMONAS AERUGINOSA (A)  Final   Report Status 10/20/2020 FINAL  Final   Organism ID, Bacteria PSEUDOMONAS AERUGINOSA (A)  Final      Susceptibility   Pseudomonas aeruginosa - MIC*    CEFTAZIDIME 4 SENSITIVE Sensitive     CIPROFLOXACIN <=0.25 SENSITIVE Sensitive     GENTAMICIN <=1 SENSITIVE Sensitive     IMIPENEM 1 SENSITIVE Sensitive     PIP/TAZO <=4 SENSITIVE Sensitive     CEFEPIME 2 SENSITIVE Sensitive     * >=100,000 COLONIES/mL PSEUDOMONAS AERUGINOSA    RADIOLOGY:  No results  found.   CODE STATUS:     Code Status Orders  (From admission, onward)         Start     Ordered   10/18/20 1945  Do not attempt resuscitation (DNR)  Continuous       Question Answer Comment  In the event of cardiac or respiratory ARREST Do not call a "code blue"   In the event of cardiac or respiratory ARREST Do not perform Intubation, CPR, defibrillation or ACLS   In the event of cardiac or respiratory ARREST Use medication by any route, position, wound care, and other measures to relive pain and suffering. May use oxygen, suction and manual treatment of airway obstruction as needed for comfort.      10/18/20 1944        Code Status History    Date Active Date Inactive Code Status Order ID Comments User Context   10/15/2020 1511 10/18/2020 1944 Full Code [id number]  Criss Alvine, DO ED   10/09/2020 1939 10/11/2020 1908 DNR [id number]  Criss Alvine, DO Inpatient   10/09/2020 1806 10/09/2020 1939 Full Code [id number]  Cox, Briant Cedar, DO Inpatient   Advance Care Planning Activity       TOTAL TIME TAKING CARE OF THIS PATIENT: *40* minutes.    Fritzi Mandes M.D  [facility]    CC: Primary care physician; Tracy Noe, MD

## 2020-10-21 NOTE — Discharge Instructions (Signed)
Keep log of bp AT HOME

## 2020-10-21 NOTE — Progress Notes (Signed)
Janet Berlin to be D/C'd Home per MD order.  Discussed prescriptions and follow up appointments with the patient. Prescriptions given to patient, medication list explained in detail. Pt verbalized understanding.  Allergies as of 10/21/2020   No Known Allergies     Medication List    STOP taking these medications   atorvastatin 10 MG tablet Commonly known as: LIPITOR     TAKE these medications   acetaminophen 325 MG tablet Commonly known as: TYLENOL Take 2 tablets (650 mg total) by mouth every 6 (six) hours as needed for mild pain, fever or headache (or Fever >/= 101).   amLODipine 10 MG tablet Commonly known as: NORVASC Take 1 tablet (10 mg total) by mouth daily.   ciprofloxacin 500 MG tablet Commonly known as: CIPRO Take 1 tablet (500 mg total) by mouth 2 (two) times daily for 14 doses.   saccharomyces boulardii 250 MG capsule Commonly known as: FLORASTOR Take 1 capsule (250 mg total) by mouth 2 (two) times daily for 7 days.   sodium bicarbonate 650 MG tablet Take 1 tablet (650 mg total) by mouth daily for 3 days. Start taking on: Oct 22, 2020       Vitals:   10/21/20 0900 10/21/20 1123  BP: 121/62 137/63  Pulse: (!) 106 89  Resp: 20 19  Temp: 98.3 F (36.8 C) 98.2 F (36.8 C)  SpO2: 97% 97%    Skin clean, dry and intact without evidence of skin break down, no evidence of skin tears noted. IV catheter discontinued intact. Site without signs and symptoms of complications. Dressing and pressure applied. Pt denies pain at this time. No complaints noted.  An After Visit Summary was printed and given to the patient. Patient escorted via [place], and D/C home via private auto.  Fuller Mandril, RN

## 2020-10-22 ENCOUNTER — Telehealth: Payer: Self-pay | Admitting: Oncology

## 2020-10-22 ENCOUNTER — Other Ambulatory Visit: Payer: Self-pay | Admitting: *Deleted

## 2020-10-22 ENCOUNTER — Ambulatory Visit: Payer: BC Managed Care – PPO | Admitting: Family Medicine

## 2020-10-22 DIAGNOSIS — N179 Acute kidney failure, unspecified: Secondary | ICD-10-CM

## 2020-10-22 NOTE — Telephone Encounter (Signed)
Can you f/u with a phone call in the next 1-2 days? 1st admission went home with creat of 2.2. creatinine 3 days later was 7.5 and I sent her back to the hospital. Now 2.6. I really want it checked this week

## 2020-10-22 NOTE — Telephone Encounter (Signed)
Called patient to make her aware of appointment for labs tomorrow afternoon (5/4). Patient stated that she is still recovering and would like a few days of rest before committing to any appointments. Patient stated that she would call back tomorrow or Thursday and let us know when she would like to come in for labs.

## 2020-10-22 NOTE — Telephone Encounter (Signed)
Left VM with patient to notify her of lab appointment and PET scans scheduled. Will attempt to contact again this afternoon.

## 2020-10-23 ENCOUNTER — Inpatient Hospital Stay: Payer: BC Managed Care – PPO

## 2020-10-23 ENCOUNTER — Encounter: Payer: Self-pay | Admitting: Oncology

## 2020-10-24 ENCOUNTER — Telehealth: Payer: Self-pay | Admitting: *Deleted

## 2020-10-24 DIAGNOSIS — R7989 Other specified abnormal findings of blood chemistry: Secondary | ICD-10-CM

## 2020-10-24 NOTE — Anesthesia Postprocedure Evaluation (Signed)
Anesthesia Post Note  Patient: Tracy Goodman  Procedure(s) Performed: CYSTOSCOPY WITH RETROGRADE PYELOGRAM/URETERAL STENT PLACEMENT (Bilateral ) FULGURATION OF BLADDER TUMOR (N/A )  Patient location during evaluation: PACU Anesthesia Type: General Level of consciousness: awake and alert Pain management: pain level controlled Vital Signs Assessment: post-procedure vital signs reviewed and stable Respiratory status: spontaneous breathing, nonlabored ventilation and respiratory function stable Cardiovascular status: blood pressure returned to baseline and stable Postop Assessment: no apparent nausea or vomiting Anesthetic complications: no   No complications documented.   Last Vitals:  Vitals:   10/11/20 0536 10/11/20 1338  BP: (!) 159/96 (!) 151/75  Pulse: (!) 102 100  Resp: 17 15  Temp: 36.4 C 36.7 C  SpO2: 98% 98%    Last Pain:  Vitals:   10/11/20 1338  TempSrc: Oral  PainSc:                  Alphonsus Sias

## 2020-10-24 NOTE — Telephone Encounter (Signed)
Spoke to patient. She is feeling well. She's coming in for labs tomorrow.

## 2020-10-24 NOTE — Telephone Encounter (Signed)
Left vm for patient. Lauren asked me to reach out to the patient to arrange for a lab only tomorrow. (metb) to recheck patient's creatinine. Unable to reach patient. Left detailed vm requesting call back as soon as possible to obtain additional lab apt.  Will also send mychart msg.

## 2020-10-24 NOTE — Telephone Encounter (Signed)
Opened additional phone note in error

## 2020-10-24 NOTE — Telephone Encounter (Signed)
Per Ander Purpura - she spoke with patient and pt confirmed apts

## 2020-10-25 ENCOUNTER — Other Ambulatory Visit: Payer: Self-pay

## 2020-10-25 ENCOUNTER — Other Ambulatory Visit: Payer: Self-pay | Admitting: *Deleted

## 2020-10-25 ENCOUNTER — Inpatient Hospital Stay: Payer: BC Managed Care – PPO | Attending: Oncology

## 2020-10-25 DIAGNOSIS — N179 Acute kidney failure, unspecified: Secondary | ICD-10-CM | POA: Insufficient documentation

## 2020-10-25 DIAGNOSIS — I824Z1 Acute embolism and thrombosis of unspecified deep veins of right distal lower extremity: Secondary | ICD-10-CM | POA: Insufficient documentation

## 2020-10-25 DIAGNOSIS — I82451 Acute embolism and thrombosis of right peroneal vein: Secondary | ICD-10-CM | POA: Diagnosis not present

## 2020-10-25 DIAGNOSIS — C541 Malignant neoplasm of endometrium: Secondary | ICD-10-CM | POA: Insufficient documentation

## 2020-10-25 DIAGNOSIS — R7989 Other specified abnormal findings of blood chemistry: Secondary | ICD-10-CM

## 2020-10-25 DIAGNOSIS — N888 Other specified noninflammatory disorders of cervix uteri: Secondary | ICD-10-CM

## 2020-10-25 DIAGNOSIS — C786 Secondary malignant neoplasm of retroperitoneum and peritoneum: Secondary | ICD-10-CM | POA: Insufficient documentation

## 2020-10-25 DIAGNOSIS — R0602 Shortness of breath: Secondary | ICD-10-CM | POA: Diagnosis not present

## 2020-10-25 DIAGNOSIS — Z7901 Long term (current) use of anticoagulants: Secondary | ICD-10-CM | POA: Insufficient documentation

## 2020-10-25 LAB — BASIC METABOLIC PANEL
Anion gap: 12 (ref 5–15)
BUN: 25 mg/dL — ABNORMAL HIGH (ref 8–23)
CO2: 22 mmol/L (ref 22–32)
Calcium: 8.5 mg/dL — ABNORMAL LOW (ref 8.9–10.3)
Chloride: 103 mmol/L (ref 98–111)
Creatinine, Ser: 3.26 mg/dL — ABNORMAL HIGH (ref 0.44–1.00)
GFR, Estimated: 15 mL/min — ABNORMAL LOW (ref >60)
Glucose, Bld: 118 mg/dL — ABNORMAL HIGH (ref 70–99)
Potassium: 4.1 mmol/L (ref 3.5–5.1)
Sodium: 137 mmol/L (ref 135–145)

## 2020-10-26 ENCOUNTER — Other Ambulatory Visit: Payer: Self-pay

## 2020-10-26 ENCOUNTER — Inpatient Hospital Stay: Payer: BC Managed Care – PPO

## 2020-10-26 ENCOUNTER — Emergency Department: Payer: BC Managed Care – PPO

## 2020-10-26 ENCOUNTER — Encounter: Payer: Self-pay | Admitting: Family Medicine

## 2020-10-26 ENCOUNTER — Inpatient Hospital Stay
Admission: EM | Admit: 2020-10-26 | Discharge: 2020-10-28 | DRG: 299 | Disposition: A | Discharge status: Home / Self Care | Payer: BC Managed Care – PPO | Attending: Internal Medicine | Admitting: Internal Medicine

## 2020-10-26 DIAGNOSIS — R000 Tachycardia, unspecified: Secondary | ICD-10-CM | POA: Diagnosis present

## 2020-10-26 DIAGNOSIS — Z79899 Other long term (current) drug therapy: Secondary | ICD-10-CM

## 2020-10-26 DIAGNOSIS — I10 Essential (primary) hypertension: Secondary | ICD-10-CM | POA: Diagnosis not present

## 2020-10-26 DIAGNOSIS — I2699 Other pulmonary embolism without acute cor pulmonale: Secondary | ICD-10-CM

## 2020-10-26 DIAGNOSIS — Z20822 Contact with and (suspected) exposure to covid-19: Secondary | ICD-10-CM | POA: Diagnosis present

## 2020-10-26 DIAGNOSIS — I129 Hypertensive chronic kidney disease with stage 1 through stage 4 chronic kidney disease, or unspecified chronic kidney disease: Secondary | ICD-10-CM | POA: Diagnosis present

## 2020-10-26 DIAGNOSIS — N184 Chronic kidney disease, stage 4 (severe): Secondary | ICD-10-CM | POA: Diagnosis present

## 2020-10-26 DIAGNOSIS — N189 Chronic kidney disease, unspecified: Secondary | ICD-10-CM | POA: Diagnosis not present

## 2020-10-26 DIAGNOSIS — Z6841 Body Mass Index (BMI) 40.0 and over, adult: Secondary | ICD-10-CM

## 2020-10-26 DIAGNOSIS — I82451 Acute embolism and thrombosis of right peroneal vein: Secondary | ICD-10-CM | POA: Diagnosis present

## 2020-10-26 DIAGNOSIS — C786 Secondary malignant neoplasm of retroperitoneum and peritoneum: Secondary | ICD-10-CM | POA: Diagnosis present

## 2020-10-26 DIAGNOSIS — N179 Acute kidney failure, unspecified: Secondary | ICD-10-CM

## 2020-10-26 DIAGNOSIS — R7989 Other specified abnormal findings of blood chemistry: Secondary | ICD-10-CM

## 2020-10-26 DIAGNOSIS — J90 Pleural effusion, not elsewhere classified: Secondary | ICD-10-CM | POA: Diagnosis present

## 2020-10-26 DIAGNOSIS — D630 Anemia in neoplastic disease: Secondary | ICD-10-CM | POA: Diagnosis present

## 2020-10-26 DIAGNOSIS — C569 Malignant neoplasm of unspecified ovary: Secondary | ICD-10-CM | POA: Diagnosis present

## 2020-10-26 DIAGNOSIS — I82401 Acute embolism and thrombosis of unspecified deep veins of right lower extremity: Secondary | ICD-10-CM | POA: Diagnosis present

## 2020-10-26 DIAGNOSIS — I82441 Acute embolism and thrombosis of right tibial vein: Secondary | ICD-10-CM | POA: Diagnosis present

## 2020-10-26 DIAGNOSIS — Z8249 Family history of ischemic heart disease and other diseases of the circulatory system: Secondary | ICD-10-CM

## 2020-10-26 DIAGNOSIS — R0602 Shortness of breath: Principal | ICD-10-CM

## 2020-10-26 DIAGNOSIS — Z66 Do not resuscitate: Secondary | ICD-10-CM | POA: Diagnosis present

## 2020-10-26 DIAGNOSIS — R590 Localized enlarged lymph nodes: Secondary | ICD-10-CM | POA: Diagnosis present

## 2020-10-26 LAB — COMPREHENSIVE METABOLIC PANEL
ALT: 12 U/L (ref 0–44)
AST: 19 U/L (ref 15–41)
Albumin: 2.5 g/dL — ABNORMAL LOW (ref 3.5–5.0)
Alkaline Phosphatase: 99 U/L (ref 38–126)
Anion gap: 13 (ref 5–15)
BUN: 25 mg/dL — ABNORMAL HIGH (ref 8–23)
CO2: 19 mmol/L — ABNORMAL LOW (ref 22–32)
Calcium: 8.4 mg/dL — ABNORMAL LOW (ref 8.9–10.3)
Chloride: 106 mmol/L (ref 98–111)
Creatinine, Ser: 3.49 mg/dL — ABNORMAL HIGH (ref 0.44–1.00)
GFR, Estimated: 14 mL/min — ABNORMAL LOW (ref >60)
Glucose, Bld: 107 mg/dL — ABNORMAL HIGH (ref 70–99)
Potassium: 3.8 mmol/L (ref 3.5–5.1)
Sodium: 138 mmol/L (ref 135–145)
Total Bilirubin: 0.7 mg/dL (ref 0.3–1.2)
Total Protein: 6.9 g/dL (ref 6.5–8.1)

## 2020-10-26 LAB — TROPONIN I (HIGH SENSITIVITY)
Troponin I (High Sensitivity): 6 ng/L (ref <18)
Troponin I (High Sensitivity): 9 ng/L (ref <18)

## 2020-10-26 LAB — BASIC METABOLIC PANEL
Anion gap: 11 (ref 5–15)
BUN: 25 mg/dL — ABNORMAL HIGH (ref 8–23)
CO2: 23 mmol/L (ref 22–32)
Calcium: 8.7 mg/dL — ABNORMAL LOW (ref 8.9–10.3)
Chloride: 105 mmol/L (ref 98–111)
Creatinine, Ser: 3.49 mg/dL — ABNORMAL HIGH (ref 0.44–1.00)
GFR, Estimated: 14 mL/min — ABNORMAL LOW (ref >60)
Glucose, Bld: 106 mg/dL — ABNORMAL HIGH (ref 70–99)
Potassium: 4.1 mmol/L (ref 3.5–5.1)
Sodium: 139 mmol/L (ref 135–145)

## 2020-10-26 LAB — CBC WITH DIFFERENTIAL/PLATELET
Abs Immature Granulocytes: 0.27 10*3/uL — ABNORMAL HIGH (ref 0.00–0.07)
Basophils Absolute: 0 10*3/uL (ref 0.0–0.1)
Basophils Relative: 0 %
Eosinophils Absolute: 0.1 10*3/uL (ref 0.0–0.5)
Eosinophils Relative: 1 %
HCT: 31.8 % — ABNORMAL LOW (ref 36.0–46.0)
Hemoglobin: 9.7 g/dL — ABNORMAL LOW (ref 12.0–15.0)
Immature Granulocytes: 3 %
Lymphocytes Relative: 14 %
Lymphs Abs: 1.5 10*3/uL (ref 0.7–4.0)
MCH: 26.2 pg (ref 26.0–34.0)
MCHC: 30.5 g/dL (ref 30.0–36.0)
MCV: 85.9 fL (ref 80.0–100.0)
Monocytes Absolute: 1.2 10*3/uL — ABNORMAL HIGH (ref 0.1–1.0)
Monocytes Relative: 11 %
Neutro Abs: 7.4 10*3/uL (ref 1.7–7.7)
Neutrophils Relative %: 71 %
Platelets: 346 10*3/uL (ref 150–400)
RBC: 3.7 MIL/uL — ABNORMAL LOW (ref 3.87–5.11)
RDW: 14.6 % (ref 11.5–15.5)
WBC: 10.5 10*3/uL (ref 4.0–10.5)
nRBC: 0 % (ref 0.0–0.2)

## 2020-10-26 LAB — URINALYSIS, ROUTINE W REFLEX MICROSCOPIC
Bacteria, UA: NONE SEEN
Bilirubin Urine: NEGATIVE
Glucose, UA: NEGATIVE mg/dL
Ketones, ur: NEGATIVE mg/dL
Nitrite: NEGATIVE
Protein, ur: NEGATIVE mg/dL
Specific Gravity, Urine: 1.004 — ABNORMAL LOW (ref 1.005–1.030)
pH: 7 (ref 5.0–8.0)

## 2020-10-26 LAB — CBC
HCT: 33.2 % — ABNORMAL LOW (ref 36.0–46.0)
Hemoglobin: 9.9 g/dL — ABNORMAL LOW (ref 12.0–15.0)
MCH: 26.1 pg (ref 26.0–34.0)
MCHC: 29.8 g/dL — ABNORMAL LOW (ref 30.0–36.0)
MCV: 87.4 fL (ref 80.0–100.0)
Platelets: 316 10*3/uL (ref 150–400)
RBC: 3.8 MIL/uL — ABNORMAL LOW (ref 3.87–5.11)
RDW: 14.6 % (ref 11.5–15.5)
WBC: 10 10*3/uL (ref 4.0–10.5)
nRBC: 0 % (ref 0.0–0.2)

## 2020-10-26 LAB — APTT: aPTT: 127 seconds — ABNORMAL HIGH (ref 24–36)

## 2020-10-26 LAB — MAGNESIUM: Magnesium: 2.1 mg/dL (ref 1.7–2.4)

## 2020-10-26 LAB — PROTIME-INR
INR: 1.2 (ref 0.8–1.2)
Prothrombin Time: 14.9 seconds (ref 11.4–15.2)

## 2020-10-26 LAB — BRAIN NATRIURETIC PEPTIDE: B Natriuretic Peptide: 23.8 pg/mL (ref 0.0–100.0)

## 2020-10-26 LAB — D-DIMER, QUANTITATIVE: D-Dimer, Quant: 19.94 ug/mL-FEU — ABNORMAL HIGH (ref 0.00–0.50)

## 2020-10-26 LAB — PROCALCITONIN: Procalcitonin: 2.83 ng/mL

## 2020-10-26 LAB — RESP PANEL BY RT-PCR (FLU A&B, COVID) ARPGX2
Influenza A by PCR: NEGATIVE
Influenza B by PCR: NEGATIVE
SARS Coronavirus 2 by RT PCR: NEGATIVE

## 2020-10-26 LAB — HEPARIN LEVEL (UNFRACTIONATED)
Heparin Unfractionated: 0.14 IU/mL — ABNORMAL LOW (ref 0.30–0.70)
Heparin Unfractionated: 0.27 IU/mL — ABNORMAL LOW (ref 0.30–0.70)

## 2020-10-26 MED ORDER — ONDANSETRON HCL 4 MG/2ML IJ SOLN: 4.0000 mg | Freq: Four times a day (QID) | INTRAMUSCULAR | Status: DC | PRN

## 2020-10-26 MED ORDER — AMLODIPINE BESYLATE 10 MG PO TABS
10.0000 mg | ORAL_TABLET | Freq: Every day | ORAL | Status: DC
  Administered 2020-10-26 – 2020-10-28 (×3): 10 mg via ORAL
  Filled 2020-10-26 (×3): qty 1

## 2020-10-26 MED ORDER — ACETAMINOPHEN 325 MG PO TABS: 650.0000 mg | ORAL_TABLET | Freq: Four times a day (QID) | ORAL | Status: DC | PRN

## 2020-10-26 MED ORDER — TRAZODONE HCL 50 MG PO TABS: 25.0000 mg | ORAL_TABLET | Freq: Every evening | ORAL | Status: DC | PRN

## 2020-10-26 MED ORDER — ACETAMINOPHEN 650 MG RE SUPP: 650.0000 mg | Freq: Four times a day (QID) | RECTAL | Status: DC | PRN

## 2020-10-26 MED ORDER — ONDANSETRON HCL 4 MG PO TABS: 4.0000 mg | ORAL_TABLET | Freq: Four times a day (QID) | ORAL | Status: DC | PRN

## 2020-10-26 MED ORDER — HEPARIN BOLUS VIA INFUSION
2100.0000 [IU] | Freq: Once | INTRAVENOUS | Status: AC
  Administered 2020-10-26: 2100 [IU] via INTRAVENOUS
  Filled 2020-10-26: qty 2100

## 2020-10-26 MED ORDER — MAGNESIUM HYDROXIDE 400 MG/5ML PO SUSP: 30.0000 mL | Freq: Every day | ORAL | Status: DC | PRN

## 2020-10-26 MED ORDER — HEPARIN (PORCINE) 25000 UT/250ML-% IV SOLN
1750.0000 [IU]/h | INTRAVENOUS | Status: DC
  Administered 2020-10-26: 1450 [IU]/h via INTRAVENOUS
  Administered 2020-10-26: 1250 [IU]/h via INTRAVENOUS
  Administered 2020-10-27: 1750 [IU]/h via INTRAVENOUS
  Filled 2020-10-26 (×4): qty 250

## 2020-10-26 MED ORDER — HEPARIN BOLUS VIA INFUSION
5000.0000 [IU] | INTRAVENOUS | Status: AC
  Administered 2020-10-26: 5000 [IU] via INTRAVENOUS
  Filled 2020-10-26: qty 5000

## 2020-10-26 MED ORDER — HEPARIN BOLUS VIA INFUSION
1000.0000 [IU] | Freq: Once | INTRAVENOUS | Status: AC
  Administered 2020-10-26: 1000 [IU] via INTRAVENOUS
  Filled 2020-10-26: qty 1000

## 2020-10-26 MED ORDER — TECHNETIUM TO 99M ALBUMIN AGGREGATED
4.0000 | Freq: Once | INTRAVENOUS | Status: AC | PRN
  Administered 2020-10-26: 4.38 via INTRAVENOUS

## 2020-10-26 MED ORDER — SODIUM CHLORIDE 0.9 % IV SOLN: INTRAVENOUS | Status: DC

## 2020-10-26 MED ORDER — SACCHAROMYCES BOULARDII 250 MG PO CAPS
250.0000 mg | ORAL_CAPSULE | Freq: Two times a day (BID) | ORAL | Status: DC
  Administered 2020-10-26 – 2020-10-28 (×5): 250 mg via ORAL
  Filled 2020-10-26 (×6): qty 1

## 2020-10-26 MED ORDER — MORPHINE SULFATE (PF) 2 MG/ML IV SOLN: 2.0000 mg | INTRAVENOUS | Status: DC | PRN

## 2020-10-26 MED ORDER — LACTATED RINGERS IV BOLUS
1000.0000 mL | Freq: Once | INTRAVENOUS | Status: AC
  Administered 2020-10-26: 1000 mL via INTRAVENOUS

## 2020-10-26 NOTE — Progress Notes (Addendum)
[facility]                                                                              Patient Demographics  Tracy Goodman, is a 65 y.o. female, DOB - [birth year], XBM:[id number]  Admit date - 10/26/2020   Admitting Physician Christel Mormon, MD  Outpatient Primary MD for the patient is [name], MD  Outpatient specialists:   LOS - 0  days   Medical records reviewed and are as summarized below:    Chief Complaint  Patient presents with  . Shortness of Breath       Brief summary   Patient is a 65 year old female with history of metastatic ovarian CA, hypertension, diverticulosis with diverticulitis, CKD stage IV presented to ED with shortness of breath, associated bilateral lower extremity swelling for last couple days.  She denied any fevers or chills. In ED, sinus tachycardia with heart rate 119  BP 137/102,  RR 24  procalcitonin 2.83, D-dimer was elevated.  Venous Doppler of right lower extremity positive for DVT.   Assessment & Plan   Acute right lower extremity DVT with likely acute left lung PE, small left pleural effusion.  - In the setting of metastatic ovarian CA, presented with shortness of breath and bilateral lower extremity edema - Patient was placed on IV heparin drip - Underwent VQ scan this morning, discussed with radiology Dr. Nevada Crane, does have asymmetric decreased perfusion activity throughout much of the left lung.  She does have a left pleural effusion which is small and mismatch likely not expected from the pleural effusion. - Given pleural effusion is not large, no hypoxia, sats 96% on room air, patient reports shortness of breath is much improving from admission, will hold off on thoracentesis unless symptoms worsening -TOC consult for NOAC, likely will need lifelong given history of malignancy -Procalcitonin 2.83, does not have any fevers or leukocytosis, will obtain UA and blood cultures for completion of work-up today. CXR showed left  lung atelectasis.  Acute kidney injury superimposed on stage IV CKD -Baseline creatinine ~3.5-3.8, was 5.1 on 4/28 and has been improving.   -On admission creatinine 3.49 -Avoid nephrotoxic meds. -Renal ultrasound 10/09/2020 showed mild bilateral renal obstruction likely due to distal ureteral obstruction - patient had undergone cystoscopy with stent placement in 09/2020 by urology,, Dr. Erlene Quan. -If creatinine trending up, will repeat renal ultrasound to assess any dislodgment of the stent.  Obtain UA to rule out any UTI.  Essential hypertension -Currently on Norvasc 10 mg daily. -Tachycardia likely due to acute PE.  If no significant improvement, will place on low-dose metoprolol  Metastatic ovarian CA omental carcinomatosis, retroperitoneal and pelvic adenopathy -Continue outpatient follow-up with oncology,  Normocytic anemia, likely anemia of chronic disease/malignancy -Hemoglobin currently at baseline of 9  Morbid obesity Estimated body mass index is 50.98 kg/m as calculated from the following:   Height as of this encounter: 4\' 11"  (1.499 m).   Weight as of this encounter: 114.5 kg.  Code Status: DNR DVT Prophylaxis:  On IV heparin drip   Level of Care: Level of care: Progressive Cardiac Family Communication: Discussed all imaging results,  lab results, explained to the patient and pt's husband on the phone    Disposition Plan:     Status is: Inpatient  Remains inpatient appropriate because:Inpatient level of care appropriate due to severity of illness   Dispo: The patient is from: Home              Anticipated d/c is to: Home              Patient currently is not medically stable to d/c.   Difficult to place patient No      Time Spent in minutes   35 minutes  Procedures:  VQ scan  Consultants:     Antimicrobials:   Anti-infectives (From admission, onward)   None          Medications  Scheduled Meds: . amLODipine  10 mg Oral Daily  .  saccharomyces boulardii  250 mg Oral BID   Continuous Infusions: . sodium chloride 100 mL/hr at 10/26/20 0520  . heparin 1,250 Units/hr (10/26/20 0342)   PRN Meds:.acetaminophen **OR** acetaminophen, magnesium hydroxide, morphine injection, ondansetron **OR** ondansetron (ZOFRAN) IV, traZODone      Subjective:   Ardelle Haliburton was seen and examined today.  States she was short of breath at the time of admission, is now improving since last night.  Lower extremity edema also improving per patient.  Has not ambulated yet.  Patient denies dizziness, chest pain, abdominal pain, N/V/D/C, new weakness, numbess, tingling.  Objective:   Vitals:   10/26/20 0300 10/26/20 0343 10/26/20 0400 10/26/20 0746  BP:   (!) 147/92 126/79  Pulse: (!) 109 (!) 111 (!) 117 (!) 110  Resp: 20 (!) 22 (!) 22 18  Temp:   98.2 F (36.8 C) 98.4 F (36.9 C)  TempSrc:   Oral Oral  SpO2: 97% 97% 98% 96%  Weight:   114.5 kg   Height:   4\' 11"  (1.499 m)     Intake/Output Summary (Last 24 hours) at 10/26/2020 1007 Last data filed at 10/26/2020 0343 Gross per 24 hour  Intake --  Output 300 ml  Net -300 ml     Wt Readings from Last 3 Encounters:  10/26/20 114.5 kg  10/20/20 120 kg  10/10/20 108 kg     Exam  General: Alert and oriented x 3, NAD  Cardiovascular: S1 S2 auscultated, no murmurs, RRR  Respiratory: Diminished breath sound at the bases L>R  Gastrointestinal: Soft, nontender, nondistended, + bowel sounds  Ext: trace pedal edema bilaterally  Neuro: no new deficits  Musculoskeletal: No digital cyanosis, clubbing  Skin: No rashes  Psych: Normal affect and demeanor, alert and oriented x3    Data Reviewed:  I have personally reviewed following labs and imaging studies  Micro Results Recent Results (from the past 240 hour(s))  CULTURE, BLOOD (ROUTINE X 2) w Reflex to ID Panel     Status: Abnormal   Collection Time: 10/18/20  8:08 AM   Specimen: BLOOD  Result Value Ref Range Status    Specimen Description   Final    BLOOD BLOOD LEFT HAND Performed at [facility], [address] [place], [place] [address]    Special Requests   Final    BOTTLES DRAWN AEROBIC AND ANAEROBIC Blood Culture adequate volume Performed at [facility], [address] [place], [place] [address]    Culture  Setup Time   Final    GRAM NEGATIVE RODS AEROBIC BOTTLE ONLY CRITICAL RESULT CALLED TO, READ BACK BY AND VERIFIED  WITHVioleta Gelinas AT 2246 10/18/20 MF Performed at [facility], [place] [address] [place], [place] [address]    Culture PSEUDOMONAS AERUGINOSA (A)  Final   Report Status 10/21/2020 FINAL  Final   Organism ID, Bacteria PSEUDOMONAS AERUGINOSA  Final      Susceptibility   Pseudomonas aeruginosa - MIC*    CEFTAZIDIME 4 SENSITIVE Sensitive     CIPROFLOXACIN <=0.25 SENSITIVE Sensitive     GENTAMICIN <=1 SENSITIVE Sensitive     IMIPENEM <=0.25 SENSITIVE Sensitive     PIP/TAZO <=4 SENSITIVE Sensitive     CEFEPIME 2 SENSITIVE Sensitive     * PSEUDOMONAS AERUGINOSA  Blood Culture ID Panel (Reflexed)     Status: Abnormal   Collection Time: 10/18/20  8:08 AM  Result Value Ref Range Status   Enterococcus faecalis NOT DETECTED NOT DETECTED Final   Enterococcus Faecium NOT DETECTED NOT DETECTED Final   Listeria monocytogenes NOT DETECTED NOT DETECTED Final   Staphylococcus species NOT DETECTED NOT DETECTED Final   Staphylococcus aureus (BCID) NOT DETECTED NOT DETECTED Final   Staphylococcus epidermidis NOT DETECTED NOT DETECTED Final   Staphylococcus lugdunensis NOT DETECTED NOT DETECTED Final   Streptococcus species NOT DETECTED NOT DETECTED Final   Streptococcus agalactiae NOT DETECTED NOT DETECTED Final   Streptococcus pneumoniae NOT DETECTED NOT DETECTED Final   Streptococcus pyogenes NOT DETECTED NOT DETECTED Final   A.calcoaceticus-baumannii NOT DETECTED NOT DETECTED Final   Bacteroides fragilis NOT DETECTED NOT DETECTED Final   Enterobacterales  NOT DETECTED NOT DETECTED Final   Enterobacter cloacae complex NOT DETECTED NOT DETECTED Final   Escherichia coli NOT DETECTED NOT DETECTED Final   Klebsiella aerogenes NOT DETECTED NOT DETECTED Final   Klebsiella oxytoca NOT DETECTED NOT DETECTED Final   Klebsiella pneumoniae NOT DETECTED NOT DETECTED Final   Proteus species NOT DETECTED NOT DETECTED Final   Salmonella species NOT DETECTED NOT DETECTED Final   Serratia marcescens NOT DETECTED NOT DETECTED Final   Haemophilus influenzae NOT DETECTED NOT DETECTED Final   Neisseria meningitidis NOT DETECTED NOT DETECTED Final   Pseudomonas aeruginosa DETECTED (A) NOT DETECTED Final    Comment: CRITICAL RESULT CALLED TO, READ BACK BY AND VERIFIED WITH: JASON ROBBINS AT 2248 10/18/20 MF    Stenotrophomonas maltophilia NOT DETECTED NOT DETECTED Final   Candida albicans NOT DETECTED NOT DETECTED Final   Candida auris NOT DETECTED NOT DETECTED Final   Candida glabrata NOT DETECTED NOT DETECTED Final   Candida krusei NOT DETECTED NOT DETECTED Final   Candida parapsilosis NOT DETECTED NOT DETECTED Final   Candida tropicalis NOT DETECTED NOT DETECTED Final   Cryptococcus neoformans/gattii NOT DETECTED NOT DETECTED Final   CTX-M ESBL NOT DETECTED NOT DETECTED Final   Carbapenem resistance IMP NOT DETECTED NOT DETECTED Final   Carbapenem resistance KPC NOT DETECTED NOT DETECTED Final   Carbapenem resistance NDM NOT DETECTED NOT DETECTED Final   Carbapenem resistance VIM NOT DETECTED NOT DETECTED Final    Comment: Performed at [facility], [address] [place], [place] [address]  CULTURE, BLOOD (ROUTINE X 2) w Reflex to ID Panel     Status: Abnormal   Collection Time: 10/18/20  8:20 AM   Specimen: BLOOD  Result Value Ref Range Status   Specimen Description   Final    BLOOD BLOOD RIGHT ARM Performed at [facility], [address] [place], [place] [address]    Special Requests   Final    BOTTLES DRAWN AEROBIC AND  ANAEROBIC  Blood Culture adequate volume Performed at [facility], [address] [place], [place] [address]    Culture  Setup Time   Final    GRAM NEGATIVE RODS AEROBIC BOTTLE ONLY CRITICAL VALUE NOTED.  VALUE IS CONSISTENT WITH PREVIOUSLY REPORTED AND CALLED VALUE. Performed at [facility], [address] [place], [place] [address]    Culture (A)  Final    PSEUDOMONAS AERUGINOSA SUSCEPTIBILITIES PERFORMED ON PREVIOUS CULTURE WITHIN THE LAST 5 DAYS. Performed at [facility], [place] [address], [place], [place] [address]    Report Status 10/21/2020 FINAL  Final  Urine Culture     Status: Abnormal   Collection Time: 10/18/20  8:38 AM   Specimen: Urine, Random  Result Value Ref Range Status   Specimen Description   Final    URINE, RANDOM Performed at [facility], [address] [place], [place] [address]    Special Requests   Final    NONE Performed at [facility], [address] [place], [place] [address]    Culture >=100,000 COLONIES/mL PSEUDOMONAS AERUGINOSA (A)  Final   Report Status 10/20/2020 FINAL  Final   Organism ID, Bacteria PSEUDOMONAS AERUGINOSA (A)  Final      Susceptibility   Pseudomonas aeruginosa - MIC*    CEFTAZIDIME 4 SENSITIVE Sensitive     CIPROFLOXACIN <=0.25 SENSITIVE Sensitive     GENTAMICIN <=1 SENSITIVE Sensitive     IMIPENEM 1 SENSITIVE Sensitive     PIP/TAZO <=4 SENSITIVE Sensitive     CEFEPIME 2 SENSITIVE Sensitive     * >=100,000 COLONIES/mL PSEUDOMONAS AERUGINOSA  Resp Panel by RT-PCR (Flu A&B, Covid) Nasopharyngeal Swab     Status: None   Collection Time: 10/26/20 12:12 AM   Specimen: Nasopharyngeal Swab; Nasopharyngeal(NP) swabs in vial transport medium  Result Value Ref Range Status   SARS Coronavirus 2 by RT PCR NEGATIVE NEGATIVE Final    Comment: (NOTE) SARS-CoV-2 target nucleic acids are NOT DETECTED.  The SARS-CoV-2 RNA is generally detectable in upper respiratory specimens during the  acute phase of infection. The lowest concentration of SARS-CoV-2 viral copies this assay can detect is 138 copies/mL. A negative result does not preclude SARS-Cov-2 infection and should not be used as the sole basis for treatment or other patient management decisions. A negative result may occur with  improper specimen collection/handling, submission of specimen other than nasopharyngeal swab, presence of viral mutation(s) within the areas targeted by this assay, and inadequate number of viral copies(<138 copies/mL). A negative result must be combined with clinical observations, patient history, and epidemiological information. The expected result is Negative.  Fact Sheet for Patients:  EntrepreneurPulse.com.au  Fact Sheet for Healthcare Providers:  IncredibleEmployment.be  This test is no t yet approved or cleared by the [place] FDA and  has been authorized for detection and/or diagnosis of SARS-CoV-2 by FDA under an Emergency Use Authorization (EUA). This EUA will remain  in effect (meaning this test can be used) for the duration of the COVID-19 declaration under Section 564(b)(1) of the Act, 21 U.S.C.section 360bbb-3(b)(1), unless the authorization is terminated  or revoked sooner.       Influenza A by PCR NEGATIVE NEGATIVE Final   Influenza B by PCR NEGATIVE NEGATIVE Final    Comment: (NOTE) The Xpert Xpress SARS-CoV-2/FLU/RSV plus assay is intended as an aid in the diagnosis of influenza from Nasopharyngeal swab specimens and should not be used as a sole basis for treatment. Nasal washings and aspirates are  unacceptable for Xpert Xpress SARS-CoV-2/FLU/RSV testing.  Fact Sheet for Patients: EntrepreneurPulse.com.au  Fact Sheet for Healthcare Providers: IncredibleEmployment.be  This test is not yet approved or cleared by the [place] FDA and has been authorized for detection and/or  diagnosis of SARS-CoV-2 by FDA under an Emergency Use Authorization (EUA). This EUA will remain in effect (meaning this test can be used) for the duration of the COVID-19 declaration under Section 564(b)(1) of the Act, 21 U.S.C. section 360bbb-3(b)(1), unless the authorization is terminated or revoked.  Performed at [facility], [address], [place], [place] [address]     Radiology Reports CT ABDOMEN PELVIS WO CONTRAST  Result Date: 09/30/2020 CLINICAL DATA:  66 year old female with abdominal pain greater on the left. Recent diverticulitis status post full course of antibiotics. Two episodes of bright red blood in stool yesterday. EXAM: CT ABDOMEN AND PELVIS WITHOUT CONTRAST TECHNIQUE: Multidetector CT imaging of the abdomen and pelvis was performed following the standard protocol without IV contrast. COMPARISON:  CT Abdomen and Pelvis 09/04/2020. FINDINGS: Lower chest: Small layering left pleural effusion is new since last month. No cardiomegaly or pericardial effusion. Mild lung base atelectasis. Hepatobiliary: Trace perihepatic free fluid is new since last month. Otherwise noncontrast liver and gallbladder appear stable. Pancreas: Negative. Spleen: Small volume perisplenic free fluid is new since last month. Simple fluid density. Otherwise stable noncontrast spleen. Adrenals/Urinary Tract: Normal adrenal glands. New bilateral hydronephrosis (series 2, image 32). No nephrolithiasis. Both ureters are difficult to delineate at the pelvic inlet where to generalized increased inflammation is noted from last month. Urinary bladder is diminutive. Stomach/Bowel: New free fluid layering in both gutters greater on the left. Simple fluid density on that side (coronal image 90). Generalized mesenteric stranding in the distal omentum (coronal image 74 and series 2, image 64) superficial to the uterus and adnexa which are abnormal, described below. No dilated small or large bowel loops.  Diverticulosis of the large bowel. Sigmoid colon is indistinct and inseparable from the uterine fundus on sagittal image 116. No pneumoperitoneum is identified. Negative terminal ileum. Appendix cannot be delineated but likely courses toward the uterine fundus. Decompressed stomach and duodenum. Vascular/Lymphatic: Aortoiliac calcified atherosclerosis. Normal caliber abdominal aorta. Vascular patency is not evaluated in the absence of IV contrast. Reproductive: Moderate generalized parametrial inflammation has increased (series 2, image 63). Continued gas in the vagina. No gas identified within the endometrium. Other: Increased presacral stranding since last month. No layering pelvic free fluid. Small fat containing inguinal hernias are stable. Musculoskeletal: Lumbar facet arthropathy. No acute osseous abnormality identified. IMPRESSION: 1. Small volume new free fluid in the abdomen and pelvis. No free air identified. Increased and generalized inflammation at the pelvic inlet, in the distal omentum, and about the uterus and ovaries. Sigmoid colon appears indistinct and inseparable from the uterine fundus, with persistent gas within the vagina. Appendix not delineated. New bilateral hydronephrosis. 2. Recommend repeat CT Abdomen and Pelvis with oral and IV contrast to further characterize (and recommend sufficient oral contrast and timing delay to ensure contrast has reached the sigmoid colon). 3. Small new layering left pleural effusion. Electronically Signed   By: Genevie Ann M.D.   On: 09/30/2020 06:28   NM Pulmonary Perfusion  Addendum Date: 10/26/2020   ADDENDUM REPORT: 10/26/2020 09:28 ADDENDUM: Study discussed by telephone with Jerris Keltz MD on 10/26/2020 at 0923 hours. The patient is being actively anticoagulated for DVT. We also discussed that I estimate the volume of left pleural effusion is small based on  the recent comparisons. Electronically Signed   By: Genevie Ann M.D.   On: 10/26/2020 09:28   Result  Date: 10/26/2020 CLINICAL DATA:  65 year old female with shortness of breath. Positive right calf DVT. EXAM: NUCLEAR MEDICINE PERFUSION LUNG SCAN TECHNIQUE: Perfusion images were obtained in multiple projections after intravenous injection of radiopharmaceutical. Ventilation scans intentionally deferred if perfusion scan and chest x-ray adequate for interpretation during COVID 19 epidemic. RADIOPHARMACEUTICALS:  4.4 mCi Tc-14m MAA IV COMPARISON:  Portable chest 0032 hours today. CT Abdomen and Pelvis Nov 12, 2020. FINDINGS: Pronounced asymmetry, decreased perfusion activity throughout much of the left lung. Left pleural effusion on recent comparison Scala although fairly small. Additional more discrete perfusion defects in the left upper lung on the anterior and oblique images. Fairly homogeneous activity in the right lung. IMPRESSION: Asymmetric decreased activity in the left lung, both global and focal, which seems out of proportion to that expected from the left pleural effusion seen on recent comparisons. Therefore this exam is suspicious for Acute PE. Electronically Signed: By: Genevie Ann M.D. On: 10/26/2020 09:18   CT ABDOMEN PELVIS W CONTRAST  Result Date: 09/30/2020 CLINICAL DATA:  65 year old female with history of left lower abdominal pain. Suspected abdominal abscess or infection. EXAM: CT ABDOMEN AND PELVIS WITH CONTRAST TECHNIQUE: Multidetector CT imaging of the abdomen and pelvis was performed using the standard protocol following bolus administration of intravenous contrast. CONTRAST:  35mL OMNIPAQUE IOHEXOL 300 MG/ML  SOLN COMPARISON:  CT of the abdomen and pelvis 10/01/2018 to. FINDINGS: Lower chest: Small left pleural effusion lying dependently with some associated passive subsegmental atelectasis in the left lower lobe. Scarring or subsegmental atelectasis in the right middle lobe also noted. Hepatobiliary: No suspicious cystic or solid hepatic lesions. No intra or extrahepatic biliary ductal  dilatation. Gallbladder is normal in appearance. Pancreas: Fatty infiltration in the pancreas. No pancreatic mass. No pancreatic ductal dilatation. No pancreatic or peripancreatic fluid collections or inflammatory changes. Spleen: Unremarkable. Adrenals/Urinary Tract: Bilateral kidneys and adrenal glands are normal in appearance. No hydroureteronephrosis. Urinary bladder is nearly completely collapsed, but otherwise unremarkable in appearance. Stomach/Bowel: Stomach is completely decompressed, but otherwise unremarkable in appearance. No pathologic dilatation of small bowel or colon. Numerous colonic diverticulae are noted, without surrounding inflammatory changes to clearly indicate an acute diverticulitis at this time. Appendix is not confidently identified may be surgically absent. Vascular/Lymphatic: Atherosclerosis in the pelvic vasculature. No aneurysm or dissection noted in the abdominal or pelvic vasculature. Multiple prominent borderline enlarged and mildly enlarged retroperitoneal and pelvic lymph nodes. Specific examples include a left para-aortic lymph node measuring 1 cm in short axis adjacent to the left renal hilum (axial image 33 of series 2), 1.3 cm in short axis in the left para-aortic nodal station inferior to the left renal hilum (axial image 40 of series 2), 1 cm in short axis in the right external iliac nodal station (axial images 62 and 64 of series 2) and 1.3 cm in short axis in the right common femoral nodal station (axial image 75 of series 2). Borderline enlarged mesorectal lymph node on the right (axial image 75 of series 2) measuring 9 mm in short axis. Reproductive: Mass-like enlargement of the cervix measuring 3.8 x 3.0 x 2.9 cm. Remainder of the uterus and ovaries are otherwise unremarkable in appearance. Other: Trace volume of ascites. Extensive omental nodularity, most notable in the low anatomic pelvis. Bilateral inguinal hernias containing predominantly fat, although some of the  lower omental nodularity extends into the right inguinal hernia. Small  umbilical hernia containing omental fat. Trace volume of ascites. No pneumoperitoneum. Musculoskeletal: There are no aggressive appearing lytic or blastic lesions noted in the visualized portions of the skeleton. IMPRESSION: 1. No intra-abdominal abscess. 2. 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 Notably, the cervix has a mass-like appearance. Further evaluation with pelvic examination is recommended to exclude gynecologic malignancy. 3. Small left pleural effusion. 4. Colonic diverticulosis without evidence of acute diverticulitis at this time. 5. Additional incidental findings, as above. Electronically Signed   By: Vinnie Langton M.D.   On: 09/30/2020 11:55   US RENAL  Result Date: 10/09/2020 CLINICAL DATA:  Rising creatinine.  Advanced gyn malignancy EXAM: RENAL / URINARY TRACT ULTRASOUND COMPLETE COMPARISON:  CT abdomen pelvis 09/30/2020 FINDINGS: Right Kidney: Renal measurements: 10.8 x 5.2 x 5.6 cm = volume: 162 mL. Mild right hydronephrosis unchanged from prior CT. No renal mass. Left Kidney: Renal measurements: 11.1 x 5.2 x 5.2 cm = volume: 156 mL. Mild left hydronephrosis unchanged from recent CT. No left renal mass. Bladder: Urinary jets not seen during 10 minutes of observation. Other: Trace ascites IMPRESSION: Mild bilateral renal obstruction likely due to distal ureteral obstruction. No change from recent CT. Electronically Signed   By: Franchot Gallo M.D.   On: 10/09/2020 16:17   US Venous Img Lower Bilateral  Result Date: 10/26/2020 CLINICAL DATA:  65 year old female with bilateral lower extremity edema. EXAM: BILATERAL LOWER EXTREMITY VENOUS DOPPLER ULTRASOUND TECHNIQUE: Gray-scale sonography with graded compression, as well  as color Doppler and duplex ultrasound were performed to evaluate the lower extremity deep venous systems from the level of the common femoral vein and including the common femoral, femoral, profunda femoral, popliteal and calf veins including the posterior tibial, peroneal and gastrocnemius veins when visible. The superficial great saphenous vein was also interrogated. Spectral Doppler was utilized to evaluate flow at rest and with distal augmentation maneuvers in the common femoral, femoral and popliteal veins. COMPARISON:  None. FINDINGS: RIGHT LOWER EXTREMITY Common Femoral Vein: No evidence of thrombus. Normal compressibility, respiratory phasicity and response to augmentation. Saphenofemoral Junction: No evidence of thrombus. Normal compressibility and flow on color Doppler imaging. Profunda Femoral Vein: No evidence of thrombus. Normal compressibility and flow on color Doppler imaging. Femoral Vein: No evidence of thrombus. Normal compressibility, respiratory phasicity and response to augmentation. Popliteal Vein: No evidence of thrombus. Normal compressibility, respiratory phasicity and response to augmentation. Calf Veins: There is thrombus in the posterior tibial and peroneal veins with noncompressibility of the vessels. Five Superficial Great Saphenous Vein: No evidence of thrombus. Normal compressibility. Venous Reflux:  None. Other Findings:  None. LEFT LOWER EXTREMITY Common Femoral Vein: No evidence of thrombus. Normal compressibility, respiratory phasicity and response to augmentation. Saphenofemoral Junction: No evidence of thrombus. Normal compressibility and flow on color Doppler imaging. Profunda Femoral Vein: No evidence of thrombus. Normal compressibility and flow on color Doppler imaging. Femoral Vein: No evidence of thrombus. Normal compressibility, respiratory phasicity and response to augmentation. Popliteal Vein: No evidence of thrombus. Normal compressibility, respiratory phasicity and  response to augmentation. Calf Veins: No evidence of thrombus. Normal compressibility and flow on color Doppler imaging. Superficial Great Saphenous Vein: No evidence of thrombus. Normal compressibility. Venous Reflux:  None. Other Findings:  None. IMPRESSION: 1. Occlusive thrombus in the right calf involving the posterior tibial and peroneal veins. 2. No DVT in the left lower  extremity. These results were called by telephone at the time of interpretation on 10/26/2020 at 2:49 am to provider [facility] , who verbally acknowledged these results. Electronically Signed   By: Anner Crete M.D.   On: 10/26/2020 02:51   DG Chest Portable 1 View  Result Date: 10/26/2020 CLINICAL DATA:  Shortness of breath EXAM: PORTABLE CHEST 1 VIEW COMPARISON:  10/19/2020 FINDINGS: Mild increased opacity at the left lung base. No pulmonary edema. No pleural effusion or pneumothorax. Normal cardiomediastinal contours. IMPRESSION: Mild increased opacity at the left lung base may indicate atelectasis. Electronically Signed   By: Ulyses Jarred M.D.   On: 10/26/2020 00:56   DG Chest Port 1 View  Result Date: 10/19/2020 CLINICAL DATA:  65 year old female with shortness of breath. EXAM: PORTABLE CHEST 1 VIEW COMPARISON:  Portable chest 10/18/2020 and earlier. FINDINGS: Portable AP semi upright view at 0713 hours. Continued lower lung volumes than baseline compared to last month. Mediastinal contours remain normal. Visualized tracheal air column is within normal limits. No pneumothorax. No pleural effusion. Mild crowding of lung markings including along the right minor fissure is stable since 10/15/2020. Paucity of bowel gas in the upper abdomen. No acute osseous abnormality identified. IMPRESSION: Stable low lung volumes and suspected mild atelectasis. No new cardiopulmonary abnormality. Electronically Signed   By: Genevie Ann M.D.   On: 10/19/2020 08:00   DG Chest Port 1 View  Result Date: 10/18/2020 CLINICAL DATA:  Fever. EXAM:  PORTABLE CHEST 1 VIEW COMPARISON:  October 15, 2020. FINDINGS: The heart size and mediastinal contours are within normal limits. Right lung is clear. Minimal left basilar subsegmental atelectasis is noted and decreased compared to prior exam. The visualized skeletal structures are unremarkable. IMPRESSION: Improved left basilar subsegmental atelectasis. Electronically Signed   By: Marijo Conception M.D.   On: 10/18/2020 08:51   DG Chest Port 1 View  Result Date: 10/15/2020 CLINICAL DATA:  AK I EXAM: PORTABLE CHEST 1 VIEW COMPARISON:  Same day CT abdomen pelvis and chest radiograph September 04, 2020. FINDINGS: The heart size and mediastinal contours are within normal limits. Low lung volumes with bibasilar linear atelectasis. Small left pleural effusion with adjacent consolidative opacity. No visible pneumothorax. The visualized skeletal structures are unchanged. IMPRESSION: Small left pleural effusion with adjacent left lower lobe consolidative opacity, which may reflect atelectasis and/or infection. Electronically Signed   By: Dahlia Bailiff MD   On: 10/15/2020 16:03   DG OR UROLOGY CYSTO IMAGE ([place])  Result Date: 10/10/2020 There is no interpretation for this exam.  This order is for images obtained during a surgical procedure.  Please See "Surgeries" Tab for more information regarding the procedure.   US BREAST LTD UNI RIGHT INC AXILLA  Result Date: 10/01/2020 CLINICAL DATA:  65 year old female presenting with a new palpable area of concern in the right breast. EXAM: DIGITAL DIAGNOSTIC UNILATERAL RIGHT MAMMOGRAM WITH TOMOSYNTHESIS AND CAD; ULTRASOUND RIGHT BREAST LIMITED TECHNIQUE: Right digital diagnostic mammography and breast tomosynthesis was performed. The images were evaluated with computer-aided detection.; Targeted ultrasound examination of the right breast was performed COMPARISON:  Previous exam(s). ACR Breast Density Category b: There are scattered areas of fibroglandular density. FINDINGS:  Mammogram: A skin BB marks the site of concern reported by the patient in the upper outer right breast. A spot tangential view of this area is performed in addition to standard views. There is a round circumscribed mass with central fatty hilum measuring approximately 1.2 cm at the palpable site, most likely  an intramammary lymph node. There are no additional new findings elsewhere in the right breast. On physical exam at site of concern reported by the patient I feel a discrete mass. Ultrasound: Targeted ultrasound is performed at the palpable site of concern in the right breast at 9:30 o'clock 9 cm from the nipple demonstrating a round circumscribed hypoechoic mass with central fatty hilum measuring 1.3 x 1.3 x 1.1 cm, consistent with a intramammary lymph node. There is cortical thickening measuring 0.5 cm. This corresponds to the mammographic finding. Targeted ultrasound of the right axilla demonstrates normal lymph nodes. IMPRESSION: Abnormal intramammary lymph node in the right breast at 9:30 o'clock at the palpable site reported by the patient. RECOMMENDATION: Ultrasound-guided core needle biopsy of the right breast mass at 9:30 o'clock. I have discussed the findings and recommendations with the patient who agrees to proceed with biopsy. The patient will be contacted by our scheduler to arrange the biopsy appointment. BI-RADS CATEGORY  4: Suspicious. Electronically Signed   By: Audie Pinto M.D.   On: 10/01/2020 11:23   MM DIAG BREAST TOMO UNI RIGHT  Result Date: 10/01/2020 CLINICAL DATA:  65 year old female presenting with a new palpable area of concern in the right breast. EXAM: DIGITAL DIAGNOSTIC UNILATERAL RIGHT MAMMOGRAM WITH TOMOSYNTHESIS AND CAD; ULTRASOUND RIGHT BREAST LIMITED TECHNIQUE: Right digital diagnostic mammography and breast tomosynthesis was performed. The images were evaluated with computer-aided detection.; Targeted ultrasound examination of the right breast was performed  COMPARISON:  Previous exam(s). ACR Breast Density Category b: There are scattered areas of fibroglandular density. FINDINGS: Mammogram: A skin BB marks the site of concern reported by the patient in the upper outer right breast. A spot tangential view of this area is performed in addition to standard views. There is a round circumscribed mass with central fatty hilum measuring approximately 1.2 cm at the palpable site, most likely an intramammary lymph node. There are no additional new findings elsewhere in the right breast. On physical exam at site of concern reported by the patient I feel a discrete mass. Ultrasound: Targeted ultrasound is performed at the palpable site of concern in the right breast at 9:30 o'clock 9 cm from the nipple demonstrating a round circumscribed hypoechoic mass with central fatty hilum measuring 1.3 x 1.3 x 1.1 cm, consistent with a intramammary lymph node. There is cortical thickening measuring 0.5 cm. This corresponds to the mammographic finding. Targeted ultrasound of the right axilla demonstrates normal lymph nodes. IMPRESSION: Abnormal intramammary lymph node in the right breast at 9:30 o'clock at the palpable site reported by the patient. RECOMMENDATION: Ultrasound-guided core needle biopsy of the right breast mass at 9:30 o'clock. I have discussed the findings and recommendations with the patient who agrees to proceed with biopsy. The patient will be contacted by our scheduler to arrange the biopsy appointment. BI-RADS CATEGORY  4: Suspicious. Electronically Signed   By: Audie Pinto M.D.   On: 10/01/2020 11:23   CT Renal Stone Study  Result Date: 10/15/2020 CLINICAL DATA:  Renal insufficiency. EXAM: CT ABDOMEN AND PELVIS WITHOUT CONTRAST TECHNIQUE: Multidetector CT imaging of the abdomen and pelvis was performed following the standard protocol without IV contrast. COMPARISON:  09/30/2020. FINDINGS: Lower chest: Basilar atelectasis again noted bilaterally with interval  increase in posterior left lower lobe collapse/consolidative opacity. Small left pleural effusion has progressed in the interval. Hepatobiliary: No focal abnormality in the liver on this study without intravenous contrast. Layering sludge noted in the distended gallbladder. No intrahepatic or extrahepatic biliary dilation. Pancreas:  No focal mass lesion. No dilatation of the main duct. No intraparenchymal cyst. No peripancreatic edema. Spleen: No splenomegaly. No focal mass lesion. Adrenals/Urinary Tract: No adrenal nodule or mass. Mild hydronephrosis in both kidneys is similar to prior with bilateral internal ureteral stents visualized. No associated hydroureter. The bladder is decompressed. Stomach/Bowel: Tiny hiatal hernia. Stomach otherwise unremarkable. Duodenum is normally positioned as is the ligament of Treitz. No small bowel wall thickening. No small bowel dilatation. The terminal ileum is normal. The appendix is not well visualized, but there is no edema or inflammation in the region of the cecum. No gross colonic mass. No colonic wall thickening. Diverticular changes are noted in the left colon without evidence of diverticulitis. Vascular/Lymphatic: No abdominal aortic aneurysm. No change in the retroperitoneal abdominopelvic lymphadenopathy, better characterized on the recent CT scan performed with intravenous contrast material. Reproductive: The uterus is unremarkable. Similar prominence of the cervix. There is no adnexal mass. Other: Interval increase in small to moderate volume abdominopelvic ascites with edema and nodularity in the omentum inferiorly, as before. Musculoskeletal: Small bilateral groin hernias contain only fat. No worrisome lytic or sclerotic osseous abnormality. IMPRESSION: 1. Mild bilateral hydronephrosis is similar to prior, now with bilateral internal ureteral stents in situ. No evidence for hydroureter in the bladder is decompressed. 2. Similar appearance of upper normal to mildly  enlarged retroperitoneal and pelvic sidewall lymphadenopathy. This was better evaluated on recent CT with IV contrast. 3. Interval progression of ascites, now small to moderate volume. There is associated edema/caking in the omentum concerning for metastatic disease. 4. Small bilateral groin hernias contain only fat. 5. Left colonic diverticulosis without diverticulitis. Electronically Signed   By: Misty Stanley M.D.   On: 10/15/2020 13:06    Lab Data:  CBC: Recent Labs  Lab 10/20/20 0356 10/21/20 0331 10/26/20 0022 10/26/20 0440  WBC 11.6* 10.4 10.5 10.0  NEUTROABS 9.5*  --  7.4  --   HGB 9.1* 9.2* 9.7* 9.9*  HCT 29.9* 30.0* 31.8* 33.2*  MCV 86.9 86.7 85.9 87.4  PLT 105* 117* 346 557   Basic Metabolic Panel: Recent Labs  Lab 10/20/20 0356 10/21/20 0331 10/25/20 1128 10/26/20 0022 10/26/20 0440  NA 138 137 137 138 139  K 4.6 4.0 4.1 3.8 4.1  CL 106 105 103 106 105  CO2 24 25 22  19* 23  GLUCOSE 93 100* 118* 107* 106*  BUN 25* 25* 25* 25* 25*  CREATININE 3.12* 2.67* 3.26* 3.49* 3.49*  CALCIUM 8.1* 8.3* 8.5* 8.4* 8.7*  MG  --   --   --  2.1  --    GFR: Estimated Creatinine Clearance: 18.7 mL/min (A) (by C-G formula based on SCr of 3.49 mg/dL (H)). Liver Function Tests: Recent Labs  Lab 10/26/20 0022  AST 19  ALT 12  ALKPHOS 99  BILITOT 0.7  PROT 6.9  ALBUMIN 2.5*   No results for input(s): LIPASE, AMYLASE in the last 168 hours. No results for input(s): AMMONIA in the last 168 hours. Coagulation Profile: Recent Labs  Lab 10/26/20 0440  INR 1.2   Cardiac Enzymes: No results for input(s): CKTOTAL, CKMB, CKMBINDEX, TROPONINI in the last 168 hours. BNP (last 3 results) No results for input(s): PROBNP in the last 8760 hours. HbA1C: No results for input(s): HGBA1C in the last 72 hours. CBG: No results for input(s): GLUCAP in the last 168 hours. Lipid Profile: No results for input(s): CHOL, HDL, LDLCALC, TRIG, CHOLHDL, LDLDIRECT in the last 72 hours. Thyroid  Function Tests: No  results for input(s): TSH, T4TOTAL, FREET4, T3FREE, THYROIDAB in the last 72 hours. Anemia Panel: No results for input(s): VITAMINB12, FOLATE, FERRITIN, TIBC, IRON, RETICCTPCT in the last 72 hours. Urine analysis:    Component Value Date/Time   COLORURINE YELLOW (A) 10/18/2020 0838   APPEARANCEUR HAZY (A) 10/18/2020 0838   LABSPEC 1.012 10/18/2020 0838   PHURINE 6.0 10/18/2020 0838   GLUCOSEU NEGATIVE 10/18/2020 0838   HGBUR LARGE (A) 10/18/2020 0838   BILIRUBINUR NEGATIVE 10/18/2020 0838   KETONESUR NEGATIVE 10/18/2020 0838   PROTEINUR 30 (A) 10/18/2020 0838   NITRITE NEGATIVE 10/18/2020 0838   LEUKOCYTESUR LARGE (A) 10/18/2020 8485     Monzerrat Wellen M.D. [facility] 10/26/2020, 10:07 AM  Available via Epic secure chat 7am-7pm After 7 pm, please refer to night coverage provider listed on amion.

## 2020-10-26 NOTE — Plan of Care (Signed)
  Problem: Education: Goal: Knowledge of General Education information will improve Description: Including pain rating scale, medication(s)/side effects and non-pharmacologic comfort measures Outcome: Progressing   Problem: Clinical Measurements: Goal: Respiratory complications will improve Outcome: Progressing   Problem: Clinical Measurements: Goal: Cardiovascular complication will be avoided Outcome: Progressing   Problem: Safety: Goal: Ability to remain free from injury will improve Outcome: Progressing   

## 2020-10-26 NOTE — Progress Notes (Signed)
Patient arrived to unit in no distress. VSS 

## 2020-10-26 NOTE — H&P (Addendum)
[place]   PATIENT NAME: Tracy Goodman    MR#:  [id number]  DATE OF BIRTH:  15-Mar-1956  DATE OF ADMISSION:  10/26/2020  PRIMARY CARE PHYSICIAN: [name], MD   Patient is coming from: Home.  REQUESTING/REFERRING PHYSICIAN: Hulan Saas, MD  CHIEF COMPLAINT:   Chief Complaint  Patient presents with  . Shortness of Breath    HISTORY OF PRESENT ILLNESS:  Tracy Goodman is a 65 y.o. female with medical history significant for metastatic ovarian cancer, hypertension and diverticulosis with diverticulitis, presented to the emergency room with a cancer of dyspnea with associated bilateral lower extremity swelling for the last couple of days.  She denied any fever or chills.  No nausea or vomiting or abdominal pain.  No chest pain or palpitations.  She denied any cough or wheezing or hemoptysis.  No dysuria, oliguria or hematuria or flank pain.  No bleeding diathesis.  ED Course: When she came to the ER blood pressure was 137/102 with respiratory to 24 and heart rate of 119 with otherwise normal vital signs.  Labs revealed a BUN of 25 and creatinine 3.49 with a procalcitonin of 2.83 and CBC showing anemia.  D-dimer was 19.94, BNP was 23.8 and high-sensitivity troponin I was 6.  EKG as reviewed by me : Showed sinus tachycardia with rate 114.   Imaging: Chest x-ray showed mild increased opacity in the left lung base that may indicate atelectasis.  Venous Doppler of right lower extremity came back positive for DVT in the right calf involving the posterior tibial and peroneal veins with no left lower extremity DVT.  The patient was given IV heparin bolus and drip as well as 1 L bolus of IV lactated Ringer.  She is admitted to a progressive unit bed for further evaluation and management. PAST MEDICAL HISTORY:   Past Medical History:  Diagnosis Date  . Diverticulitis   . Hypertension   . 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 CESAREAN SECTION    . CYSTOSCOPY W/ URETERAL STENT PLACEMENT Bilateral 10/10/2020   Procedure: CYSTOSCOPY WITH RETROGRADE PYELOGRAM/URETERAL STENT PLACEMENT;  Surgeon: Hollice Espy, MD;  Location: [facility] ORS;  Service: Urology;  Laterality: Bilateral;  . FULGURATION OF BLADDER TUMOR N/A 10/10/2020   Procedure: FULGURATION OF BLADDER TUMOR;  Surgeon: Hollice Espy, MD;  Location: [facility] ORS;  Service: Urology;  Laterality: N/A;  . WRIST FRACTURE SURGERY Left     SOCIAL HISTORY:   Social History   Tobacco Use  . Smoking status: Never Smoker  . Smokeless tobacco: Never Used  Substance Use Topics  . Alcohol use: Not Currently    FAMILY HISTORY:   Family History  Problem Relation Age of Onset  . COPD Mother   . Heart failure Mother     DRUG ALLERGIES:  No Known Allergies  REVIEW OF SYSTEMS:   ROS As per history of present illness. All pertinent systems were reviewed above. Constitutional, HEENT, cardiovascular, respiratory, GI, GU, musculoskeletal, neuro, psychiatric, endocrine, integumentary and hematologic systems were reviewed and are otherwise negative/unremarkable except for positive findings mentioned above in the HPI.   MEDICATIONS AT HOME:   Prior to Admission medications   Medication Sig Start Date End Date Taking? Authorizing Provider  acetaminophen (TYLENOL) 325 MG tablet Take 2 tablets (650 mg total) by mouth every 6 (six) hours as needed for mild pain, fever  or headache (or Fever >/= 101). 10/11/20  Yes Elgergawy, Silver Huguenin, MD  amLODipine (NORVASC) 10 MG tablet Take 1 tablet (10 mg total) by mouth daily. 10/11/20 2020/11/24 Yes Elgergawy, Silver Huguenin, MD  ciprofloxacin (CIPRO) 500 MG tablet Take 1 tablet (500 mg total) by mouth 2 (two) times daily for 14 doses. 10/21/20 10/28/20 Yes Fritzi Mandes, MD  saccharomyces boulardii (FLORASTOR) 250 MG capsule Take 1 capsule (250 mg total) by mouth 2 (two) times daily for 7 days.  10/21/20 10/28/20 Yes Fritzi Mandes, MD      VITAL SIGNS:  Blood pressure (!) 149/66, pulse (!) 103, temperature 98.6 F (37 C), temperature source Oral, resp. rate (!) 23, height 5' (1.524 m), weight 108.4 kg, SpO2 98 %.  PHYSICAL EXAMINATION:  Physical Exam  GENERAL:  65 y.o.-year-old African-American female patient lying in the bed with no acute distress.  EYES: Pupils equal, round, reactive to light and accommodation. No scleral icterus. Extraocular muscles intact.  HEENT: Head atraumatic, normocephalic. Oropharynx and nasopharynx clear.  NECK:  Supple, no jugular venous distention. No thyroid enlargement, no tenderness.  LUNGS: Normal breath sounds bilaterally, no wheezing, rales,rhonchi or crepitation. No use of accessory muscles of respiration.  CARDIOVASCULAR: Regular rate and rhythm, S1, S2 normal. No murmurs, rubs, or gallops.  ABDOMEN: Soft, nondistended, nontender. Bowel sounds present. No organomegaly or mass.  EXTREMITIES: Bilateral lower extremity 1+ edema with no clubbing or cyanosis with more swollen right leg and calf with slightly positive Homans' sign.   NEUROLOGIC: Cranial nerves II through XII are intact. Muscle strength 5/5 in all extremities. Sensation intact. Gait not checked.  PSYCHIATRIC: The patient is alert and oriented x 3.  Normal affect and good eye contact. SKIN: No obvious rash, lesion, or ulcer.   LABORATORY PANEL:   CBC Recent Labs  Lab 10/26/20 0022  WBC 10.5  HGB 9.7*  HCT 31.8*  PLT 346   ------------------------------------------------------------------------------------------------------------------  Chemistries  Recent Labs  Lab 10/26/20 0022  NA 138  K 3.8  CL 106  CO2 19*  GLUCOSE 107*  BUN 25*  CREATININE 3.49*  CALCIUM 8.4*  MG 2.1  AST 19  ALT 12  ALKPHOS 99  BILITOT 0.7   ------------------------------------------------------------------------------------------------------------------  Cardiac Enzymes No results for  input(s): TROPONINI in the last 168 hours. ------------------------------------------------------------------------------------------------------------------  RADIOLOGY:  US Venous Img Lower Bilateral  Result Date: 10/26/2020 CLINICAL DATA:  65 year old female with bilateral lower extremity edema. EXAM: BILATERAL LOWER EXTREMITY VENOUS DOPPLER ULTRASOUND TECHNIQUE: Gray-scale sonography with graded compression, as well as color Doppler and duplex ultrasound were performed to evaluate the lower extremity deep venous systems from the level of the common femoral vein and including the common femoral, femoral, profunda femoral, popliteal and calf veins including the posterior tibial, peroneal and gastrocnemius veins when visible. The superficial great saphenous vein was also interrogated. Spectral Doppler was utilized to evaluate flow at rest and with distal augmentation maneuvers in the common femoral, femoral and popliteal veins. COMPARISON:  None. FINDINGS: RIGHT LOWER EXTREMITY Common Femoral Vein: No evidence of thrombus. Normal compressibility, respiratory phasicity and response to augmentation. Saphenofemoral Junction: No evidence of thrombus. Normal compressibility and flow on color Doppler imaging. Profunda Femoral Vein: No evidence of thrombus. Normal compressibility and flow on color Doppler imaging. Femoral Vein: No evidence of thrombus. Normal compressibility, respiratory phasicity and response to augmentation. Popliteal Vein: No evidence of thrombus. Normal compressibility, respiratory phasicity and response to augmentation. Calf Veins: There is thrombus in the posterior tibial and peroneal veins with  noncompressibility of the vessels. Five Superficial Great Saphenous Vein: No evidence of thrombus. Normal compressibility. Venous Reflux:  None. Other Findings:  None. LEFT LOWER EXTREMITY Common Femoral Vein: No evidence of thrombus. Normal compressibility, respiratory phasicity and response to  augmentation. Saphenofemoral Junction: No evidence of thrombus. Normal compressibility and flow on color Doppler imaging. Profunda Femoral Vein: No evidence of thrombus. Normal compressibility and flow on color Doppler imaging. Femoral Vein: No evidence of thrombus. Normal compressibility, respiratory phasicity and response to augmentation. Popliteal Vein: No evidence of thrombus. Normal compressibility, respiratory phasicity and response to augmentation. Calf Veins: No evidence of thrombus. Normal compressibility and flow on color Doppler imaging. Superficial Great Saphenous Vein: No evidence of thrombus. Normal compressibility. Venous Reflux:  None. Other Findings:  None. IMPRESSION: 1. Occlusive thrombus in the right calf involving the posterior tibial and peroneal veins. 2. No DVT in the left lower extremity. These results were called by telephone at the time of interpretation on 10/26/2020 at 2:49 am to provider [facility] , who verbally acknowledged these results. Electronically Signed   By: Anner Crete M.D.   On: 10/26/2020 02:51   DG Chest Portable 1 View  Result Date: 10/26/2020 CLINICAL DATA:  Shortness of breath EXAM: PORTABLE CHEST 1 VIEW COMPARISON:  10/19/2020 FINDINGS: Mild increased opacity at the left lung base. No pulmonary edema. No pleural effusion or pneumothorax. Normal cardiomediastinal contours. IMPRESSION: Mild increased opacity at the left lung base may indicate atelectasis. Electronically Signed   By: Ulyses Jarred M.D.   On: 10/26/2020 00:56      IMPRESSION AND PLAN:  Active Problems:   Acute deep vein thrombosis (DVT) of right lower extremity (HCC)  1.  Acute right lower extremity deep venous thrombosis with associated dyspnea and elevated D-dimer. - The patient will be admitted to a progressive unit bed. -We will continue her on IV heparin drip per protocol. - Given significant elevated D-dimer and dyspnea will need to rule out PE. - We will obtain a VQ scan later  this AM. - O2 protocol will be followed. - Given her history of cancer she may benefit from continued subcutaneous Lovenox at home.  2.  Acute kidney injury superimposed on stage IV chronic kidney disease. - The patient will be hydrated with IV normal saline and will follow her BMPs.  3.  Essential hypertension. - We will continue Norvasc.  4.  Metastatic ovarian cancer.  This is apparently followed on an outpatient basis. - Pain management will be provided.  DVT prophylaxis: The patient will be on IV heparin drip. Code Status: She is DNR/DNI. Family Communication:  The plan of care was discussed in details with the patient (who requested no other family members to be notified). I answered all questions. The patient agreed to proceed with the above mentioned plan. Further management will depend upon hospital course. Disposition Plan: Back to previous home environment Consults called: none. All the records are reviewed and case discussed with ED provider.  Status is: Inpatient  Remains inpatient appropriate because:Ongoing active pain requiring inpatient pain management, Ongoing diagnostic testing needed not appropriate for outpatient work up, Unsafe d/c plan, IV treatments appropriate due to intensity of illness or inability to take PO and Inpatient level of care appropriate due to severity of illness   Dispo: The patient is from: Home              Anticipated d/c is to: Home  Patient currently is not medically stable to d/c.   Difficult to place patient No      TOTAL TIME TAKING CARE OF THIS PATIENT: 55 minutes.    Christel Mormon M.D on 10/26/2020 at 3:42 AM  [facility]   From 7 PM-7 AM, contact night-coverage [email or web address]  CC: Primary care physician; [name], MD

## 2020-10-26 NOTE — Progress Notes (Signed)
[place] for heparin infusion Indication: [place] for PE from known DVT  No Known Allergies  Patient Measurements: Height: 4\' 11"  (149.9 cm) Weight: 114.5 kg (252 lb 6.4 oz) IBW/kg (Calculated) : 43.2 Heparin Dosing Weight: 72.3 kg  Vital Signs: Temp: 98.4 F (36.9 C) (05/07 0746) Temp Source: Oral (05/07 0746) BP: 126/79 (05/07 0746) Pulse Rate: 110 (05/07 0746)  Labs: Recent Labs    10/25/20 1128 10/26/20 0022 10/26/20 0440 10/26/20 0949  HGB  --  9.7* 9.9*  --   HCT  --  31.8* 33.2*  --   PLT  --  346 316  --   APTT  --   --  127*  --   LABPROT  --   --  14.9  --   INR  --   --  1.2  --   HEPARINUNFRC  --   --   --  0.14*  CREATININE 3.26* 3.49* 3.49*  --   TROPONINIHS  --  6 9  --     Estimated Creatinine Clearance: 18.7 mL/min (A) (by C-G formula based on SCr of 3.49 mg/dL (H)).   Medical History: Past Medical History:  Diagnosis Date  . Diverticulitis   . Hypertension   . Primary high grade serous adenocarcinoma of ovary [facility])     Assessment: Pt is 65 yo female presenting to ED with c/o SOB.  Med hx HTN, HDL, morbid obesity, recent metastatic carcinoma.  Recent admission 4/26-5/2 for tx of sepsis due to UTI and bilateral hydronephrosis & obstructive uropathy status post sten. MD concerned for PE from known DVT & unable to get studies overnight but can get V/Q in AM, therefore will empirically treat with heparin infusion PE dosing.   5/7 0949  HL= 0.14  Subtherapeutic, bolus 2100 units and increase rate to 1450 units/hr  Goal of Therapy:  Heparin level 0.3-0.7 units/ml Monitor platelets by anticoagulation protocol: Yes   Plan:  5/7 0949  HL= 0.14  Subtherapeutic, will order bolus 2100 units x 1 and increase drip rate to 1450 units/hr. Will check HL in 8 hrs  CBC daily   Chinita Greenland PharmD Clinical Pharmacist 10/26/2020

## 2020-10-26 NOTE — Progress Notes (Signed)
[place] for heparin infusion Indication: [place] for PE from known DVT  No Known Allergies  Patient Measurements: Height: 5' (152.4 cm) Weight: 108.4 kg (239 lb) IBW/kg (Calculated) : 45.5 Heparin Dosing Weight: 72.3 kg  Vital Signs: Temp: 98.6 F (37 C) (05/07 0122) Temp Source: Oral (05/07 0122) BP: 149/66 (05/07 0130) Pulse Rate: 103 (05/07 0130)  Labs: Recent Labs    10/25/20 1128 10/26/20 0022  HGB  --  9.7*  HCT  --  31.8*  PLT  --  346  CREATININE 3.26* 3.49*  TROPONINIHS  --  6    Estimated Creatinine Clearance: 18.4 mL/min (A) (by C-G formula based on SCr of 3.49 mg/dL (H)).   Medical History: Past Medical History:  Diagnosis Date  . Diverticulitis   . Hypertension   . Primary high grade serous adenocarcinoma of ovary [facility])     Assessment: Pt is 65 yo female presenting to ED with c/o SOB.  Med hx HTN, HDL, morbid obesity, recent metastatic carcinoma.  Recent admission 4/26-5/2 for tx of sepsis due to UTI and bilateral hydronephrosis & obstructive uropathy status post sten. MD concerned for PE from known DVT & unable to get studies overnight but can get V/Q in AM, therefore will empirically treat with heparin infusion PE dosing.  Goal of Therapy:  Heparin level 0.3-0.7 units/ml Monitor platelets by anticoagulation protocol: Yes   Plan:  Give 5000 units bolus x 1 Start heparin infusion at 1250 units/hr Check anti-Xa level in 6 hours and daily while on heparin Continue to monitor H&H and platelets  Renda Rolls, PharmD, [facility] 10/26/2020 3:19 AM

## 2020-10-26 NOTE — ED Notes (Signed)
Pt requesting to use bathroom, requested bedpan but informed of Purewick and it's use, pt consented to have Purewick placed, pericare performed for pt and Purewick applied, chux pad placed under pt

## 2020-10-26 NOTE — ED Provider Notes (Addendum)
[facility] Emergency Department Provider Note  ____________________________________________   Event Date/Time   First MD Initiated Contact with Patient 10/26/20 0011     (approximate)  I have reviewed the triage vital signs and the nursing notes.   HISTORY  Chief Complaint Shortness of Breath   HPI Tracy Goodman is a 65 y.o. 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  She denies any associated symptoms including cough, chest pain, fevers, headache, earache, sore throat, abdominal pain, nausea, vomiting, diarrhea, dysuria, change in her urine output, rash or extremity pain.  She endorses some chronic orthopnea and is unsure if her legs are any more swollen than usual or if she has gained any weight.  She has not any blood thinners but does say she bruises very easily and has a couple on her arms.  No other acute concerns at this time.         Past Medical History:  Diagnosis Date  . Diverticulitis   . Hypertension   . Primary high grade serous adenocarcinoma of ovary [facility])     Patient Active Problem List   Diagnosis Date Noted  . 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[place])   . 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[place])   . Palliative care encounter   . AKI (acute kidney injury) ([place]) 10/09/2020  . Bilateral hydronephrosis 10/09/2020  . Goals of care, counseling/discussion 10/06/2020  . Breast lump on right side at 10 o'clock position 09/23/2020  . Diverticulitis 09/23/2020  . Umbilical hernia 09/32/6712  . Enlarged thyroid gland 03/12/2020  . Hyperlipidemia 07/13/2016   . Hypertension 06/10/2016  . Morbid obesity ([place]) 06/10/2016    Past Surgical History:  Procedure Laterality Date  . CESAREAN SECTION    . CYSTOSCOPY W/ URETERAL STENT PLACEMENT Bilateral 10/10/2020   Procedure: CYSTOSCOPY WITH RETROGRADE PYELOGRAM/URETERAL STENT PLACEMENT;  Surgeon: Hollice Espy, MD;  Location: [facility] ORS;  Service: Urology;  Laterality: Bilateral;  . FULGURATION OF BLADDER TUMOR N/A 10/10/2020   Procedure: FULGURATION OF BLADDER TUMOR;  Surgeon: Hollice Espy, MD;  Location: [facility] ORS;  Service: Urology;  Laterality: N/A;  . WRIST FRACTURE SURGERY Left     Prior to Admission medications   Medication Sig Start Date End Date Taking? Authorizing Provider  acetaminophen (TYLENOL) 325 MG tablet Take 2 tablets (650 mg total) by mouth every 6 (six) hours as needed for mild pain, fever or headache (or Fever >/= 101). 10/11/20   Elgergawy, Silver Huguenin, MD  amLODipine (NORVASC) 10 MG tablet Take 1 tablet (10 mg total) by mouth daily. 10/11/20 11/21/20  Elgergawy, Silver Huguenin, MD  ciprofloxacin (CIPRO) 500 MG tablet Take 1 tablet (500 mg total) by mouth 2 (two) times daily for 14 doses. 10/21/20 10/28/20  Fritzi Mandes, MD  saccharomyces boulardii (FLORASTOR) 250 MG capsule Take 1 capsule (250 mg total) by mouth 2 (two) times daily for 7 days. 10/21/20 10/28/20  Fritzi Mandes, MD    Allergies Patient has no known allergies.  Family History  Problem Relation Age of Onset  . COPD Mother   . Heart failure Mother     Social History Social History   Tobacco Use  . Smoking status: Never  Smoker  . Smokeless tobacco: Never Used  Substance Use Topics  . Alcohol use: Not Currently  . Drug use: Never    Review of Systems  Review of Systems  Constitutional: Negative for chills and fever.  HENT: Negative for sore throat.   Eyes: Negative for pain.  Respiratory: Positive for shortness of breath. Negative for cough and stridor.   Cardiovascular: Negative for chest pain.  Gastrointestinal:  Negative for vomiting.  Genitourinary: Negative for dysuria.  Musculoskeletal: Negative for myalgias.  Skin: Negative for rash.  Neurological: Negative for seizures, loss of consciousness and headaches.  Psychiatric/Behavioral: Negative for suicidal ideas.  All other systems reviewed and are negative.     ____________________________________________   PHYSICAL EXAM:  VITAL SIGNS: ED Triage Vitals  Enc Vitals Group     BP 10/26/20 0010 (!) 137/102     Pulse Rate 10/26/20 0010 (!) 119     Resp 10/26/20 0010 (!) 24     Temp 10/26/20 0010 98.5 F (36.9 C)     Temp Source 10/26/20 0010 Oral     SpO2 10/26/20 0010 97 %     Weight 10/26/20 0012 239 lb (108.4 kg)     Height 10/26/20 0012 5' (1.524 m)     Head Circumference --      Peak Flow --      Pain Score 10/26/20 0011 0     Pain Loc --      Pain Edu? --      Excl. in [place]? --    Vitals:   10/26/20 0122 10/26/20 0130  BP: 136/62 (!) 149/66  Pulse: (!) 107 (!) 103  Resp: 20 (!) 23  Temp: 98.6 F (37 C)   SpO2: 98%    Physical Exam Vitals and nursing note reviewed.  Constitutional:      General: She is not in acute distress.    Appearance: She is well-developed. She is obese.  HENT:     Head: Normocephalic and atraumatic.     Right Ear: External ear normal.     Left Ear: External ear normal.     Nose: Nose normal.  Eyes:     Conjunctiva/sclera: Conjunctivae normal.  Cardiovascular:     Rate and Rhythm: Normal rate and regular rhythm.     Heart sounds: No murmur heard.   Pulmonary:     Effort: Pulmonary effort is normal. No respiratory distress.     Breath sounds: Normal breath sounds. No wheezing, rhonchi or rales.  Abdominal:     Palpations: Abdomen is soft.     Tenderness: There is no abdominal tenderness.  Musculoskeletal:     Cervical back: Neck supple.     Right lower leg: Edema present.     Left lower leg: Edema present.  Skin:    General: Skin is warm and dry.     Capillary Refill: Capillary  refill takes less than 2 seconds.  Neurological:     Mental Status: She is alert and oriented to person, place, and time.      ____________________________________________   LABS (all labs ordered are listed, but only abnormal results are displayed)  Labs Reviewed  CBC WITH DIFFERENTIAL/PLATELET - Abnormal; Notable for the following components:      Result Value   RBC 3.70 (*)    Hemoglobin 9.7 (*)    HCT 31.8 (*)    Monocytes Absolute 1.2 (*)    Abs Immature Granulocytes 0.27 (*)    All other components within normal limits  COMPREHENSIVE METABOLIC PANEL - Abnormal; Notable for the following components:   CO2 19 (*)    Glucose, Bld 107 (*)    BUN 25 (*)    Creatinine, Ser 3.49 (*)    Calcium 8.4 (*)    Albumin 2.5 (*)    GFR, Estimated 14 (*)    All other components within normal limits  D-DIMER, QUANTITATIVE - Abnormal; Notable for the following components:   D-Dimer, Quant 19.94 (*)    All other components within normal limits  RESP PANEL BY RT-PCR (FLU A&B, COVID) ARPGX2  BRAIN NATRIURETIC PEPTIDE  PROCALCITONIN  MAGNESIUM  TROPONIN I (HIGH SENSITIVITY)  TROPONIN I (HIGH SENSITIVITY)   ____________________________________________  EKG  Sinus tachycardia with ventricular rate 114, normal axis, unremarkable intervals and appearance of acute ischemia or other significant underlying arrhythmia.  ____________________________________________  RADIOLOGY  ED MD interpretation: X-ray has no peripheral consolidation, effusion, significant edema, pneumothorax or other clear acute intrathoracic process.  Ultrasound of the bilateral lower extremities shows right lower extremity DVT with thrombus and they posterior tibial and peroneal veins.  No thrombus noted in left lower extremity.  Official radiology report(s): US Venous Img Lower Bilateral  Result Date: 10/26/2020 CLINICAL DATA:  65 year old female with bilateral lower extremity edema. EXAM: BILATERAL LOWER EXTREMITY  VENOUS DOPPLER ULTRASOUND TECHNIQUE: Gray-scale sonography with graded compression, as well as color Doppler and duplex ultrasound were performed to evaluate the lower extremity deep venous systems from the level of the common femoral vein and including the common femoral, femoral, profunda femoral, popliteal and calf veins including the posterior tibial, peroneal and gastrocnemius veins when visible. The superficial great saphenous vein was also interrogated. Spectral Doppler was utilized to evaluate flow at rest and with distal augmentation maneuvers in the common femoral, femoral and popliteal veins. COMPARISON:  None. FINDINGS: RIGHT LOWER EXTREMITY Common Femoral Vein: No evidence of thrombus. Normal compressibility, respiratory phasicity and response to augmentation. Saphenofemoral Junction: No evidence of thrombus. Normal compressibility and flow on color Doppler imaging. Profunda Femoral Vein: No evidence of thrombus. Normal compressibility and flow on color Doppler imaging. Femoral Vein: No evidence of thrombus. Normal compressibility, respiratory phasicity and response to augmentation. Popliteal Vein: No evidence of thrombus. Normal compressibility, respiratory phasicity and response to augmentation. Calf Veins: There is thrombus in the posterior tibial and peroneal veins with noncompressibility of the vessels. Five Superficial Great Saphenous Vein: No evidence of thrombus. Normal compressibility. Venous Reflux:  None. Other Findings:  None. LEFT LOWER EXTREMITY Common Femoral Vein: No evidence of thrombus. Normal compressibility, respiratory phasicity and response to augmentation. Saphenofemoral Junction: No evidence of thrombus. Normal compressibility and flow on color Doppler imaging. Profunda Femoral Vein: No evidence of thrombus. Normal compressibility and flow on color Doppler imaging. Femoral Vein: No evidence of thrombus. Normal compressibility, respiratory phasicity and response to augmentation.  Popliteal Vein: No evidence of thrombus. Normal compressibility, respiratory phasicity and response to augmentation. Calf Veins: No evidence of thrombus. Normal compressibility and flow on color Doppler imaging. Superficial Great Saphenous Vein: No evidence of thrombus. Normal compressibility. Venous Reflux:  None. Other Findings:  None. IMPRESSION: 1. Occlusive thrombus in the right calf involving the posterior tibial and peroneal veins. 2. No DVT in the left lower extremity. These results were called by telephone at the time of interpretation on 10/26/2020 at 2:49 am to provider [facility] , who verbally acknowledged these results. Electronically Signed   By: Anner Crete M.D.   On: 10/26/2020 02:51   DG Chest Portable 1  View  Result Date: 10/26/2020 CLINICAL DATA:  Shortness of breath EXAM: PORTABLE CHEST 1 VIEW COMPARISON:  10/19/2020 FINDINGS: Mild increased opacity at the left lung base. No pulmonary edema. No pleural effusion or pneumothorax. Normal cardiomediastinal contours. IMPRESSION: Mild increased opacity at the left lung base may indicate atelectasis. Electronically Signed   By: Ulyses Jarred M.D.   On: 10/26/2020 00:56    ____________________________________________   PROCEDURES  Procedure(s) performed (including Critical Care):  .Critical Care Performed by: Lucrezia Starch, MD Authorized by: Lucrezia Starch, MD   Critical care provider statement:    Critical care time (minutes):  45   Critical care was necessary to treat or prevent imminent or life-threatening deterioration of the following conditions:  Circulatory failure   Critical care was time spent personally by me on the following activities:  Discussions with consultants, evaluation of patient's response to treatment, examination of patient, ordering and performing treatments and interventions, ordering and review of laboratory studies, ordering and review of radiographic studies, pulse oximetry, re-evaluation of  patient's condition, obtaining history from patient or surrogate and review of old charts     ____________________________________________   INITIAL IMPRESSION / [place] / ED COURSE      Patient presents with above-stated history exam for assessment of about 1 day of shortness of breath.  On arrival she is tachycardic and tachypneic with otherwise stable vital signs on room air.  Differential includes ACS, arrhythmia, anemia, PE, pneumonia, acute heart failure metabolic derangements.  ECG shows sinus tachycardia without clear ischemia.  Initial troponin is nonelevated and not consistent with ACS or myocarditis.  CBC shows no leukocytosis and hemoglobin at baseline.  Unremarkable platelets.  CMP shows bicarb of 19 and kidney function of 3.49 compared to 3.261-day ago and 2.675 days ago.  Seems to be a AKI on CKD.  D-dimer is quite elevated at 19.94.  BNP is 23.8 and given absence of edema on chest x-ray I have a low suspicion for acute symptomatic heart failure.  COVID and flu is negative.  Magnesium is unremarkable.  Overall given low suspicion for acute heart failure, ACS, pneumonia DVT seen on ultrasound of the right lower extremity with very high D-dimer and tachycardia and tachypnea I think most likely diagnosis is PE.  Fortunately unable to get a CTA secondary to patient's kidney function and nuclear medicine VQ scan is not available overnight.  We will start patient on heparin with PE dose given high concern for PE with plan for VQ scan in the morning.  Will admit to medicine service for further evaluation management.  Impression is most likely symptomatic PE from right lower extremity DVT.     ____________________________________________   FINAL CLINICAL IMPRESSION(S) / ED DIAGNOSES  Final diagnoses:  SOB (shortness of breath)  Positive D dimer  AKI (acute kidney injury) ([place])  Acute deep vein thrombosis (DVT) of right peroneal vein (H[facility])  Acute pulmonary embolism,  unspecified pulmonary embolism type, unspecified whether acute cor pulmonale present (H[facility])    Medications  lactated ringers bolus 1,000 mL (has no administration in time range)     ED Discharge Orders    None       Note:  This document was prepared using Dragon voice recognition software and may include unintentional dictation errors.   Lucrezia Starch, MD 10/26/20 0301    Lucrezia Starch, MD 10/26/20 [phone]

## 2020-10-26 NOTE — Progress Notes (Signed)
[place] for heparin infusion Indication: [place] for PE from known DVT  No Known Allergies  Patient Measurements: Height: 4\' 11"  (149.9 cm) Weight: 114.5 kg (252 lb 6.4 oz) IBW/kg (Calculated) : 43.2 Heparin Dosing Weight: 72.3 kg  Vital Signs: Temp: 98.6 F (37 C) (05/07 1712) Temp Source: Oral (05/07 1712) BP: 130/77 (05/07 1712) Pulse Rate: 100 (05/07 1712)  Labs: Recent Labs    10/25/20 1128 10/26/20 0022 10/26/20 0440 10/26/20 0949 10/26/20 1812  HGB  --  9.7* 9.9*  --   --   HCT  --  31.8* 33.2*  --   --   PLT  --  346 316  --   --   APTT  --   --  127*  --   --   LABPROT  --   --  14.9  --   --   INR  --   --  1.2  --   --   HEPARINUNFRC  --   --   --  0.14* 0.27*  CREATININE 3.26* 3.49* 3.49*  --   --   TROPONINIHS  --  6 9  --   --     Estimated Creatinine Clearance: 18.7 mL/min (A) (by C-G formula based on SCr of 3.49 mg/dL (H)).   Medical History: Past Medical History:  Diagnosis Date  . Diverticulitis   . Hypertension   . Primary high grade serous adenocarcinoma of ovary [facility])     Assessment: Pt is 65 yo female presenting to ED with c/o SOB.  Med hx HTN, HDL, morbid obesity, recent metastatic carcinoma.  Recent admission 4/26-5/2 for tx of sepsis due to UTI and bilateral hydronephrosis & obstructive uropathy status post sten. MD concerned for PE from known DVT & unable to get studies overnight but can get V/Q in AM, therefore will empirically treat with heparin infusion PE dosing.   5/7 0949  HL= 0.14  Subtherapeutic, bolus 2100 units and increase rate to 1450 units/hr 5/7 1812 HL= 0.27 Subtherapeutic.   Goal of Therapy:  Heparin level 0.3-0.7 units/ml Monitor platelets by anticoagulation protocol: Yes   Plan:  5/7 1812 HL= 0.27 Subtherapeutic.  Will order 1000 unit heparin bolus and increase infusion to 1600 units/hr. Will check HL in 8 hrs  CBC daily per protocol.    Pernell Dupre, PharmD,  BCPS Clinical Pharmacist 10/26/2020 7:08 PM

## 2020-10-27 LAB — HEPARIN LEVEL (UNFRACTIONATED)
Heparin Unfractionated: 0.25 IU/mL — ABNORMAL LOW (ref 0.30–0.70)
Heparin Unfractionated: 0.41 IU/mL (ref 0.30–0.70)
Heparin Unfractionated: 0.42 IU/mL (ref 0.30–0.70)

## 2020-10-27 LAB — CBC
HCT: 30.6 % — ABNORMAL LOW (ref 36.0–46.0)
Hemoglobin: 9.5 g/dL — ABNORMAL LOW (ref 12.0–15.0)
MCH: 26.5 pg (ref 26.0–34.0)
MCHC: 31 g/dL (ref 30.0–36.0)
MCV: 85.2 fL (ref 80.0–100.0)
Platelets: 343 10*3/uL (ref 150–400)
RBC: 3.59 MIL/uL — ABNORMAL LOW (ref 3.87–5.11)
RDW: 14.6 % (ref 11.5–15.5)
WBC: 8.2 10*3/uL (ref 4.0–10.5)
nRBC: 0 % (ref 0.0–0.2)

## 2020-10-27 LAB — BASIC METABOLIC PANEL
Anion gap: 9 (ref 5–15)
BUN: 21 mg/dL (ref 8–23)
CO2: 21 mmol/L — ABNORMAL LOW (ref 22–32)
Calcium: 8.2 mg/dL — ABNORMAL LOW (ref 8.9–10.3)
Chloride: 109 mmol/L (ref 98–111)
Creatinine, Ser: 3 mg/dL — ABNORMAL HIGH (ref 0.44–1.00)
GFR, Estimated: 17 mL/min — ABNORMAL LOW (ref >60)
Glucose, Bld: 112 mg/dL — ABNORMAL HIGH (ref 70–99)
Potassium: 3.8 mmol/L (ref 3.5–5.1)
Sodium: 139 mmol/L (ref 135–145)

## 2020-10-27 MED ORDER — WARFARIN - PHARMACIST DOSING INPATIENT: Freq: Every day | Status: DC

## 2020-10-27 MED ORDER — SODIUM CHLORIDE 0.9 % IV SOLN: INTRAVENOUS | Status: AC

## 2020-10-27 MED ORDER — WARFARIN SODIUM 7.5 MG PO TABS
7.5000 mg | ORAL_TABLET | Freq: Once | ORAL | Status: AC
  Administered 2020-10-27: 7.5 mg via ORAL
  Filled 2020-10-27: qty 1

## 2020-10-27 MED ORDER — HEPARIN BOLUS VIA INFUSION
1000.0000 [IU] | INTRAVENOUS | Status: AC
  Administered 2020-10-27: 1000 [IU] via INTRAVENOUS
  Filled 2020-10-27: qty 1000

## 2020-10-27 NOTE — Progress Notes (Signed)
[place] for heparin infusion Indication: [place] for PE from known DVT  No Known Allergies  Patient Measurements: Height: 4\' 11"  (149.9 cm) Weight: 119.3 kg (263 lb 0.1 oz) IBW/kg (Calculated) : 43.2 Heparin Dosing Weight: 72.3 kg  Vital Signs: Temp: 98.2 F (36.8 C) (05/08 1209) Temp Source: Oral (05/08 1209) BP: 138/85 (05/08 1209) Pulse Rate: 109 (05/08 1209)  Labs: Recent Labs    10/26/20 0022 10/26/20 0440 10/26/20 0949 10/26/20 1812 10/27/20 0431  HGB 9.7* 9.9*  --   --  9.5*  HCT 31.8* 33.2*  --   --  30.6*  PLT 346 316  --   --  343  APTT  --  127*  --   --   --   LABPROT  --  14.9  --   --   --   INR  --  1.2  --   --   --   HEPARINUNFRC  --   --  0.14* 0.27* 0.25*  CREATININE 3.49* 3.49*  --   --  3.00*  TROPONINIHS 6 9  --   --   --     Estimated Creatinine Clearance: 22.3 mL/min (A) (by C-G formula based on SCr of 3 mg/dL (H)).   Medical History: Past Medical History:  Diagnosis Date  . Diverticulitis   . Hypertension   . Primary high grade serous adenocarcinoma of ovary [facility])     Assessment: Pt is 65 yo female presenting to ED with c/o SOB.  Med hx HTN, HDL, morbid obesity, recent metastatic carcinoma.  Recent admission 4/26-5/2 for tx of sepsis due to UTI and bilateral hydronephrosis & obstructive uropathy status post stent. NM perfusion lung scan shows suspicious findings for acute PE, currently on a heparin infusion and warfarin is being started. She has no h/o previous warfarin exposure (baseline INR 1.2).  Goal of Therapy:  Heparin level 0.3-0.7 units/ml INR 2 - 3 Monitor platelets by anticoagulation protocol: Yes   Plan:   heparin Infusion:  anti-Xa level therapeutic: continue heparin infusion at 1750 units/hr  recheck anti-Xa level in 6 hrs   CBC daily per protocol  warfarin:  CHG dosing nomogram shows 7 predictor points and no significant DDIs: warfarin 7.5 mg po x 1  INR in  am  Continue heparin until there are 2 consecutive therapeutic INR levels   10/27/2020 12:59 PM

## 2020-10-27 NOTE — Progress Notes (Signed)
[place] for heparin infusion Indication: [place] for PE from known DVT  No Known Allergies  Patient Measurements: Height: 4\' 11"  (149.9 cm) Weight: 119.3 kg (263 lb 0.1 oz) IBW/kg (Calculated) : 43.2 Heparin Dosing Weight: 72.3 kg  Vital Signs: Temp: 98.5 F (36.9 C) (05/08 1614) Temp Source: Oral (05/08 1614) BP: 121/67 (05/08 1614) Pulse Rate: 106 (05/08 1614)  Labs: Recent Labs    10/26/20 0022 10/26/20 0440 10/26/20 0949 10/27/20 0431 10/27/20 1350 10/27/20 1949  HGB 9.7* 9.9*  --  9.5*  --   --   HCT 31.8* 33.2*  --  30.6*  --   --   PLT 346 316  --  343  --   --   APTT  --  127*  --   --   --   --   LABPROT  --  14.9  --   --   --   --   INR  --  1.2  --   --   --   --   HEPARINUNFRC  --   --    < > 0.25* 0.41 0.42  CREATININE 3.49* 3.49*  --  3.00*  --   --   TROPONINIHS 6 9  --   --   --   --    < > = values in this interval not displayed.    Estimated Creatinine Clearance: 22.3 mL/min (A) (by C-G formula based on SCr of 3 mg/dL (H)).   Medical History: Past Medical History:  Diagnosis Date  . Diverticulitis   . Hypertension   . Primary high grade serous adenocarcinoma of ovary [facility])     Assessment: Pt is 65 yo female presenting to ED with c/o SOB.  Med hx HTN, HDL, morbid obesity, recent metastatic carcinoma.  Recent admission 4/26-5/2 for tx of sepsis due to UTI and bilateral hydronephrosis & obstructive uropathy status post stent. NM perfusion lung scan shows suspicious findings for acute PE, currently on a heparin infusion and warfarin is being started. She has no h/o previous warfarin exposure (baseline INR 1.2).  Goal of Therapy:  Heparin level 0.3-0.7 units/ml INR 2 - 3 Monitor platelets by anticoagulation protocol: Yes   Plan:   heparin Infusion:  anti-Xa level therapeutic x 2: continue heparin infusion at 1750 units/hr  recheck anti-Xa level with AM labs  CBC daily per  protocol  warfarin:  CHG dosing nomogram shows 7 predictor points and no significant DDIs: warfarin 7.5 mg po x 1  INR in am  Continue heparin until there are 2 consecutive therapeutic INR levels  Paulina Fusi, PharmD, BCPS 10/27/2020 8:32 PM

## 2020-10-27 NOTE — Progress Notes (Signed)
[place] for heparin infusion Indication: [place] for PE from known DVT  No Known Allergies  Patient Measurements: Height: 4\' 11"  (149.9 cm) Weight: 114.5 kg (252 lb 6.4 oz) IBW/kg (Calculated) : 43.2 Heparin Dosing Weight: 72.3 kg  Vital Signs: Temp: 98.6 F (37 C) (05/08 0438) Temp Source: Oral (05/08 0438) BP: 127/81 (05/08 0438)  Labs: Recent Labs    10/25/20 1128 10/26/20 0022 10/26/20 0022 10/26/20 0440 10/26/20 0949 10/26/20 1812 10/27/20 0431  HGB  --  9.7*   < > 9.9*  --   --  9.5*  HCT  --  31.8*  --  33.2*  --   --  30.6*  PLT  --  346  --  316  --   --  343  APTT  --   --   --  127*  --   --   --   LABPROT  --   --   --  14.9  --   --   --   INR  --   --   --  1.2  --   --   --   HEPARINUNFRC  --   --   --   --  0.14* 0.27* 0.25*  CREATININE 3.26* 3.49*  --  3.49*  --   --   --   TROPONINIHS  --  6  --  9  --   --   --    < > = values in this interval not displayed.    Estimated Creatinine Clearance: 18.7 mL/min (A) (by C-G formula based on SCr of 3.49 mg/dL (H)).   Medical History: Past Medical History:  Diagnosis Date  . Diverticulitis   . Hypertension   . Primary high grade serous adenocarcinoma of ovary [facility])     Assessment: Pt is 65 yo female presenting to ED with c/o SOB.  Med hx HTN, HDL, morbid obesity, recent metastatic carcinoma.  Recent admission 4/26-5/2 for tx of sepsis due to UTI and bilateral hydronephrosis & obstructive uropathy status post sten. MD concerned for PE from known DVT & unable to get studies overnight but can get V/Q in AM, therefore will empirically treat with heparin infusion PE dosing.   5/7 0949  HL= 0.14  Subtherapeutic, bolus 2100 units and increase rate to 1450 units/hr 5/7 1812 HL= 0.27 Subtherapeutic. 5/8 0431 HL = 0.25 Subtherapeutic  Goal of Therapy:  Heparin level 0.3-0.7 units/ml Monitor platelets by anticoagulation protocol: Yes   Plan:  Will order 1000 unit  heparin bolus and increase infusion to 1750 units/hr. Will recheck HL in 8 hrs  CBC daily per protocol.   Renda Rolls, PharmD, [facility] 10/27/2020 5:44 AM

## 2020-10-27 NOTE — Progress Notes (Signed)
[facility]                                                                              Patient Demographics  Tracy Goodman, is a 65 y.o. female, DOB - January 18, 1956, UJW:[id number]  Admit date - 10/26/2020   Admitting Physician Christel Mormon, MD  Outpatient Primary MD for the patient is [name], MD  Outpatient specialists:   LOS - 1  days   Medical records reviewed and are as summarized below:    Chief Complaint  Patient presents with  . Shortness of Breath       Brief summary   Patient is a 65 year old female with history of metastatic ovarian CA, hypertension, diverticulosis with diverticulitis, CKD stage IV presented to ED with shortness of breath, associated bilateral lower extremity swelling for last couple days.  She denied any fevers or chills. In ED, sinus tachycardia with heart rate 119  BP 137/102,  RR 24  procalcitonin 2.83, D-dimer was elevated.  Venous Doppler of right lower extremity positive for DVT.   Assessment & Plan   Acute right lower extremity DVT with likely acute left lung PE, small left pleural effusion.  - In the setting of metastatic ovarian CA, presented with shortness of breath and bilateral lower extremity edema -Placed on IV heparin drip.  VQ scan positive for acute left lung PE -Procalcitonin 2.83, does not have any fevers or leukocytosis.  UA negative for UTI.  Blood cultures negative so far.  Chest x-ray did not show any pneumonia. - d/w vascular surgery on-call, Dr Feliberto Gottron (no formal consult), given patient has CKD stage IV with baseline creatinine around 3.5, recommended heparin/Lovenox bridge with Coumadin over [facility]  -Pharmacy consulted for starting warfarin today, continue heparin drip for bridge  Acute kidney injury superimposed on stage IV CKD -Baseline creatinine ~3.5-3.8, was 5.1 on 4/28 and has been improving.   -On admission creatinine 3.49, improving to 3.0 today, appears to be at baseline -Renal  ultrasound 10/09/2020 showed mild bilateral renal obstruction likely due to distal ureteral obstruction - patient had undergone cystoscopy with stent placement in 09/2020 by urology,, Dr. Erlene Quan. -UA negative for UTI  Essential hypertension -Currently on Norvasc 10 mg daily.  Metastatic ovarian CA omental carcinomatosis, retroperitoneal and pelvic adenopathy -Continue outpatient follow-up with oncology,  Normocytic anemia, likely anemia of chronic disease/malignancy - H/H stable   Morbid obesity Estimated body mass index is 53.12 kg/m as calculated from the following:   Height as of this encounter: 4\' 11"  (1.499 m).   Weight as of this encounter: 119.3 kg.  Code Status: DNR DVT Prophylaxis:  On IV heparin drip warfarin (COUMADIN) tablet 7.5 mg   Level of Care: Level of care: Progressive Cardiac Family Communication: Discussed all imaging results, lab results, explained to the patient   Disposition Plan:     Status is: Inpatient  Remains inpatient appropriate because:Inpatient level of care appropriate due to severity of illness   Dispo: The patient is from: Home              Anticipated d/c is to: Home  Patient currently is not medically stable to d/c. starting coumadin, need INR goal 2-3    Difficult to place patient No      Time Spent in minutes   35 minutes  Procedures:  VQ scan  Consultants:     Antimicrobials:   Anti-infectives (From admission, onward)   None         Medications  Scheduled Meds: . amLODipine  10 mg Oral Daily  . saccharomyces boulardii  250 mg Oral BID  . warfarin  7.5 mg Oral ONCE-1600  . Warfarin - Pharmacist Dosing Inpatient   Does not apply q1600   Continuous Infusions: . sodium chloride 100 mL/hr at 10/27/20 1240  . heparin 1,750 Units/hr (10/27/20 1240)   PRN Meds:.acetaminophen **OR** acetaminophen, magnesium hydroxide, morphine injection, ondansetron **OR** ondansetron (ZOFRAN) IV,  traZODone      Subjective:   Tracy Goodman was seen and examined today. Shortness of breath improving. States LE swelling is improving as well  Patient denies dizziness, chest pain, abdominal pain, N/V/D/C, new weakness, numbess, tingling.  Objective:   Vitals:   10/27/20 0438 10/27/20 0500 10/27/20 0743 10/27/20 1209  BP: 127/81  (!) 148/93 138/85  Pulse:   (!) 106 (!) 109  Resp: 20  18 18   Temp: 98.6 F (37 C)  98.4 F (36.9 C) 98.2 F (36.8 C)  TempSrc: Oral   Oral  SpO2: 95%  96% 96%  Weight:  119.3 kg    Height:        Intake/Output Summary (Last 24 hours) at 10/27/2020 1321 Last data filed at 10/27/2020 1240 Gross per 24 hour  Intake 4523.04 ml  Output 2500 ml  Net 2023.04 ml     Wt Readings from Last 3 Encounters:  10/27/20 119.3 kg  10/20/20 120 kg  10/10/20 108 kg    Physical Exam  General: Alert and oriented x 3, NAD  Cardiovascular: S1 S2 clear, RRR. trace pedal edema b/l  Respiratory: diminished BS at bases L>R   Gastrointestinal: Soft, nontender, nondistended, NBS  Ext: trace pedal edema bilaterally  Neuro: no new deficits  Musculoskeletal: No cyanosis, clubbing  Skin: No rashes  Psych: Normal affect and demeanor, alert and oriented x3     Data Reviewed:  I have personally reviewed following labs and imaging studies  Micro Results Recent Results (from the past 240 hour(s))  CULTURE, BLOOD (ROUTINE X 2) w Reflex to ID Panel     Status: Abnormal   Collection Time: 10/18/20  8:08 AM   Specimen: BLOOD  Result Value Ref Range Status   Specimen Description   Final    BLOOD BLOOD LEFT HAND Performed at [facility], [address], [place], [place] [address]    Special Requests   Final    BOTTLES DRAWN AEROBIC AND ANAEROBIC Blood Culture adequate volume Performed at [facility], [address] [place], [place] [address]    Culture  Setup Time   Final    GRAM NEGATIVE RODS AEROBIC BOTTLE ONLY CRITICAL RESULT  CALLED TO, READ BACK BY AND VERIFIED WITH: JASON ROBBINS AT 2246 10/18/20 MF Performed at [facility], [address] [place],  [address]    Culture PSEUDOMONAS AERUGINOSA (A)  Final   Report Status 10/21/2020 FINAL  Final   Organism ID, Bacteria PSEUDOMONAS AERUGINOSA  Final      Susceptibility   Pseudomonas aeruginosa - MIC*    CEFTAZIDIME 4 SENSITIVE Sensitive     CIPROFLOXACIN <=0.25 SENSITIVE Sensitive  GENTAMICIN <=1 SENSITIVE Sensitive     IMIPENEM <=0.25 SENSITIVE Sensitive     PIP/TAZO <=4 SENSITIVE Sensitive     CEFEPIME 2 SENSITIVE Sensitive     * PSEUDOMONAS AERUGINOSA  Blood Culture ID Panel (Reflexed)     Status: Abnormal   Collection Time: 10/18/20  8:08 AM  Result Value Ref Range Status   Enterococcus faecalis NOT DETECTED NOT DETECTED Final   Enterococcus Faecium NOT DETECTED NOT DETECTED Final   Listeria monocytogenes NOT DETECTED NOT DETECTED Final   Staphylococcus species NOT DETECTED NOT DETECTED Final   Staphylococcus aureus (BCID) NOT DETECTED NOT DETECTED Final   Staphylococcus epidermidis NOT DETECTED NOT DETECTED Final   Staphylococcus lugdunensis NOT DETECTED NOT DETECTED Final   Streptococcus species NOT DETECTED NOT DETECTED Final   Streptococcus agalactiae NOT DETECTED NOT DETECTED Final   Streptococcus pneumoniae NOT DETECTED NOT DETECTED Final   Streptococcus pyogenes NOT DETECTED NOT DETECTED Final   A.calcoaceticus-baumannii NOT DETECTED NOT DETECTED Final   Bacteroides fragilis NOT DETECTED NOT DETECTED Final   Enterobacterales NOT DETECTED NOT DETECTED Final   Enterobacter cloacae complex NOT DETECTED NOT DETECTED Final   Escherichia coli NOT DETECTED NOT DETECTED Final   Klebsiella aerogenes NOT DETECTED NOT DETECTED Final   Klebsiella oxytoca NOT DETECTED NOT DETECTED Final   Klebsiella pneumoniae NOT DETECTED NOT DETECTED Final   Proteus species NOT DETECTED NOT DETECTED Final   Salmonella species NOT DETECTED NOT DETECTED  Final   Serratia marcescens NOT DETECTED NOT DETECTED Final   Haemophilus influenzae NOT DETECTED NOT DETECTED Final   Neisseria meningitidis NOT DETECTED NOT DETECTED Final   Pseudomonas aeruginosa DETECTED (A) NOT DETECTED Final    Comment: CRITICAL RESULT CALLED TO, READ BACK BY AND VERIFIED WITH: JASON ROBBINS AT 2248 10/18/20 MF    Stenotrophomonas maltophilia NOT DETECTED NOT DETECTED Final   Candida albicans NOT DETECTED NOT DETECTED Final   Candida auris NOT DETECTED NOT DETECTED Final   Candida glabrata NOT DETECTED NOT DETECTED Final   Candida krusei NOT DETECTED NOT DETECTED Final   Candida parapsilosis NOT DETECTED NOT DETECTED Final   Candida tropicalis NOT DETECTED NOT DETECTED Final   Cryptococcus neoformans/gattii NOT DETECTED NOT DETECTED Final   CTX-M ESBL NOT DETECTED NOT DETECTED Final   Carbapenem resistance IMP NOT DETECTED NOT DETECTED Final   Carbapenem resistance KPC NOT DETECTED NOT DETECTED Final   Carbapenem resistance NDM NOT DETECTED NOT DETECTED Final   Carbapenem resistance VIM NOT DETECTED NOT DETECTED Final    Comment: Performed at [facility], [address] [place], [place] [address]  CULTURE, BLOOD (ROUTINE X 2) w Reflex to ID Panel     Status: Abnormal   Collection Time: 10/18/20  8:20 AM   Specimen: BLOOD  Result Value Ref Range Status   Specimen Description   Final    BLOOD BLOOD RIGHT ARM Performed at [facility], [address] [place], [place] [address]    Special Requests   Final    BOTTLES DRAWN AEROBIC AND ANAEROBIC Blood Culture adequate volume Performed at [facility], [address] [place], [place] [address]    Culture  Setup Time   Final    GRAM NEGATIVE RODS AEROBIC BOTTLE ONLY CRITICAL VALUE NOTED.  VALUE IS CONSISTENT WITH PREVIOUSLY REPORTED AND CALLED VALUE. Performed at [facility], [address] [place],  [address]    Culture (A)  Final    PSEUDOMONAS  AERUGINOSA SUSCEPTIBILITIES PERFORMED ON PREVIOUS CULTURE  WITHIN THE LAST 5 DAYS. Performed at [facility], [place] [address], [place], [place] [address]    Report Status 10/21/2020 FINAL  Final  Urine Culture     Status: Abnormal   Collection Time: 10/18/20  8:38 AM   Specimen: Urine, Random  Result Value Ref Range Status   Specimen Description   Final    URINE, RANDOM Performed at [facility], [address] [place], [place] [address]    Special Requests   Final    NONE Performed at [facility], [address] [place], [place] [address]    Culture >=100,000 COLONIES/mL PSEUDOMONAS AERUGINOSA (A)  Final   Report Status 10/20/2020 FINAL  Final   Organism ID, Bacteria PSEUDOMONAS AERUGINOSA (A)  Final      Susceptibility   Pseudomonas aeruginosa - MIC*    CEFTAZIDIME 4 SENSITIVE Sensitive     CIPROFLOXACIN <=0.25 SENSITIVE Sensitive     GENTAMICIN <=1 SENSITIVE Sensitive     IMIPENEM 1 SENSITIVE Sensitive     PIP/TAZO <=4 SENSITIVE Sensitive     CEFEPIME 2 SENSITIVE Sensitive     * >=100,000 COLONIES/mL PSEUDOMONAS AERUGINOSA  Resp Panel by RT-PCR (Flu A&B, Covid) Nasopharyngeal Swab     Status: None   Collection Time: 10/26/20 12:12 AM   Specimen: Nasopharyngeal Swab; Nasopharyngeal(NP) swabs in vial transport medium  Result Value Ref Range Status   SARS Coronavirus 2 by RT PCR NEGATIVE NEGATIVE Final    Comment: (NOTE) SARS-CoV-2 target nucleic acids are NOT DETECTED.  The SARS-CoV-2 RNA is generally detectable in upper respiratory specimens during the acute phase of infection. The lowest concentration of SARS-CoV-2 viral copies this assay can detect is 138 copies/mL. A negative result does not preclude SARS-Cov-2 infection and should not be used as the sole basis for treatment or other patient management decisions. A negative result may occur with  improper specimen collection/handling, submission of specimen other than nasopharyngeal swab,  presence of viral mutation(s) within the areas targeted by this assay, and inadequate number of viral copies(<138 copies/mL). A negative result must be combined with clinical observations, patient history, and epidemiological information. The expected result is Negative.  Fact Sheet for Patients:  EntrepreneurPulse.com.au  Fact Sheet for Healthcare Providers:  IncredibleEmployment.be  This test is no t yet approved or cleared by the [place] FDA and  has been authorized for detection and/or diagnosis of SARS-CoV-2 by FDA under an Emergency Use Authorization (EUA). This EUA will remain  in effect (meaning this test can be used) for the duration of the COVID-19 declaration under Section 564(b)(1) of the Act, 21 U.S.C.section 360bbb-3(b)(1), unless the authorization is terminated  or revoked sooner.       Influenza A by PCR NEGATIVE NEGATIVE Final   Influenza B by PCR NEGATIVE NEGATIVE Final    Comment: (NOTE) The Xpert Xpress SARS-CoV-2/FLU/RSV plus assay is intended as an aid in the diagnosis of influenza from Nasopharyngeal swab specimens and should not be used as a sole basis for treatment. Nasal washings and aspirates are unacceptable for Xpert Xpress SARS-CoV-2/FLU/RSV testing.  Fact Sheet for Patients: EntrepreneurPulse.com.au  Fact Sheet for Healthcare Providers: IncredibleEmployment.be  This test is not yet approved or cleared by the [place] FDA and has been authorized for detection and/or diagnosis of SARS-CoV-2 by FDA under an Emergency Use Authorization (EUA). This EUA will remain in effect (meaning this test can be used) for the duration of the COVID-19 declaration under Section 564(b)(1) of the Act, 21  U.S.C. section 360bbb-3(b)(1), unless the authorization is terminated or revoked.  Performed at [facility], [address] [place], [place] [address]   Culture, blood  (Routine X 2) w Reflex to ID Panel     Status: None (Preliminary result)   Collection Time: 10/26/20 10:50 AM   Specimen: BLOOD  Result Value Ref Range Status   Specimen Description BLOOD LEFT HAND  Final   Special Requests   Final    BOTTLES DRAWN AEROBIC AND ANAEROBIC Blood Culture adequate volume   Culture   Final    NO GROWTH < 24 HOURS Performed at [facility], [address] [place], [place] [address]    Report Status PENDING  Incomplete  Culture, blood (Routine X 2) w Reflex to ID Panel     Status: None (Preliminary result)   Collection Time: 10/26/20 10:50 AM   Specimen: BLOOD  Result Value Ref Range Status   Specimen Description BLOOD  LEFT ARM   Final   Special Requests   Final    BOTTLES DRAWN AEROBIC AND ANAEROBIC Blood Culture adequate volume   Culture   Final    NO GROWTH < 24 HOURS Performed at [facility], [address], [place], [place] [address]    Report Status PENDING  Incomplete    Radiology Reports CT ABDOMEN PELVIS WO CONTRAST  Result Date: 09/30/2020 CLINICAL DATA:  65 year old female with abdominal pain greater on the left. Recent diverticulitis status post full course of antibiotics. Two episodes of bright red blood in stool yesterday. EXAM: CT ABDOMEN AND PELVIS WITHOUT CONTRAST TECHNIQUE: Multidetector CT imaging of the abdomen and pelvis was performed following the standard protocol without IV contrast. COMPARISON:  CT Abdomen and Pelvis 09/04/2020. FINDINGS: Lower chest: Small layering left pleural effusion is new since last month. No cardiomegaly or pericardial effusion. Mild lung base atelectasis. Hepatobiliary: Trace perihepatic free fluid is new since last month. Otherwise noncontrast liver and gallbladder appear stable. Pancreas: Negative. Spleen: Small volume perisplenic free fluid is new since last month. Simple fluid density. Otherwise stable noncontrast spleen. Adrenals/Urinary Tract: Normal adrenal glands. New bilateral  hydronephrosis (series 2, image 32). No nephrolithiasis. Both ureters are difficult to delineate at the pelvic inlet where to generalized increased inflammation is noted from last month. Urinary bladder is diminutive. Stomach/Bowel: New free fluid layering in both gutters greater on the left. Simple fluid density on that side (coronal image 90). Generalized mesenteric stranding in the distal omentum (coronal image 74 and series 2, image 64) superficial to the uterus and adnexa which are abnormal, described below. No dilated small or large bowel loops. Diverticulosis of the large bowel. Sigmoid colon is indistinct and inseparable from the uterine fundus on sagittal image 116. No pneumoperitoneum is identified. Negative terminal ileum. Appendix cannot be delineated but likely courses toward the uterine fundus. Decompressed stomach and duodenum. Vascular/Lymphatic: Aortoiliac calcified atherosclerosis. Normal caliber abdominal aorta. Vascular patency is not evaluated in the absence of IV contrast. Reproductive: Moderate generalized parametrial inflammation has increased (series 2, image 63). Continued gas in the vagina. No gas identified within the endometrium. Other: Increased presacral stranding since last month. No layering pelvic free fluid. Small fat containing inguinal hernias are stable. Musculoskeletal: Lumbar facet arthropathy. No acute osseous abnormality identified. IMPRESSION: 1. Small volume new free fluid in the abdomen and pelvis. No free air identified. Increased and generalized inflammation at the pelvic inlet, in the distal omentum, and about the uterus and ovaries. Sigmoid colon appears indistinct and inseparable  from the uterine fundus, with persistent gas within the vagina. Appendix not delineated. New bilateral hydronephrosis. 2. Recommend repeat CT Abdomen and Pelvis with oral and IV contrast to further characterize (and recommend sufficient oral contrast and timing delay to ensure contrast has  reached the sigmoid colon). 3. Small new layering left pleural effusion. Electronically Signed   By: Genevie Ann M.D.   On: 09/30/2020 06:28   NM Pulmonary Perfusion  Addendum Date: 10/26/2020   ADDENDUM REPORT: 10/26/2020 09:28 ADDENDUM: Study discussed by telephone with Cung Masterson MD on 10/26/2020 at 0923 hours. The patient is being actively anticoagulated for DVT. We also discussed that I estimate the volume of left pleural effusion is small based on the recent comparisons. Electronically Signed   By: Genevie Ann M.D.   On: 10/26/2020 09:28   Result Date: 10/26/2020 CLINICAL DATA:  65 year old female with shortness of breath. Positive right calf DVT. EXAM: NUCLEAR MEDICINE PERFUSION LUNG SCAN TECHNIQUE: Perfusion images were obtained in multiple projections after intravenous injection of radiopharmaceutical. Ventilation scans intentionally deferred if perfusion scan and chest x-ray adequate for interpretation during COVID 19 epidemic. RADIOPHARMACEUTICALS:  4.4 mCi Tc-26m MAA IV COMPARISON:  Portable chest 0032 hours today. CT Abdomen and Pelvis Oct 28, 2020. FINDINGS: Pronounced asymmetry, decreased perfusion activity throughout much of the left lung. Left pleural effusion on recent comparison Scala although fairly small. Additional more discrete perfusion defects in the left upper lung on the anterior and oblique images. Fairly homogeneous activity in the right lung. IMPRESSION: Asymmetric decreased activity in the left lung, both global and focal, which seems out of proportion to that expected from the left pleural effusion seen on recent comparisons. Therefore this exam is suspicious for Acute PE. Electronically Signed: By: Genevie Ann M.D. On: 10/26/2020 09:18   CT ABDOMEN PELVIS W CONTRAST  Result Date: 09/30/2020 CLINICAL DATA:  65 year old female with history of left lower abdominal pain. Suspected abdominal abscess or infection. EXAM: CT ABDOMEN AND PELVIS WITH CONTRAST TECHNIQUE: Multidetector CT imaging of  the abdomen and pelvis was performed using the standard protocol following bolus administration of intravenous contrast. CONTRAST:  103mL OMNIPAQUE IOHEXOL 300 MG/ML  SOLN COMPARISON:  CT of the abdomen and pelvis 10/01/2018 to. FINDINGS: Lower chest: Small left pleural effusion lying dependently with some associated passive subsegmental atelectasis in the left lower lobe. Scarring or subsegmental atelectasis in the right middle lobe also noted. Hepatobiliary: No suspicious cystic or solid hepatic lesions. No intra or extrahepatic biliary ductal dilatation. Gallbladder is normal in appearance. Pancreas: Fatty infiltration in the pancreas. No pancreatic mass. No pancreatic ductal dilatation. No pancreatic or peripancreatic fluid collections or inflammatory changes. Spleen: Unremarkable. Adrenals/Urinary Tract: Bilateral kidneys and adrenal glands are normal in appearance. No hydroureteronephrosis. Urinary bladder is nearly completely collapsed, but otherwise unremarkable in appearance. Stomach/Bowel: Stomach is completely decompressed, but otherwise unremarkable in appearance. No pathologic dilatation of small bowel or colon. Numerous colonic diverticulae are noted, without surrounding inflammatory changes to clearly indicate an acute diverticulitis at this time. Appendix is not confidently identified may be surgically absent. Vascular/Lymphatic: Atherosclerosis in the pelvic vasculature. No aneurysm or dissection noted in the abdominal or pelvic vasculature. Multiple prominent borderline enlarged and mildly enlarged retroperitoneal and pelvic lymph nodes. Specific examples include a left para-aortic lymph node measuring 1 cm in short axis adjacent to the left renal hilum (axial image 33 of series 2), 1.3 cm in short axis in the left para-aortic nodal station inferior to the left renal hilum (axial image 40 of  series 2), 1 cm in short axis in the right external iliac nodal station (axial images 62 and 64 of series 2)  and 1.3 cm in short axis in the right common femoral nodal station (axial image 75 of series 2). Borderline enlarged mesorectal lymph node on the right (axial image 75 of series 2) measuring 9 mm in short axis. Reproductive: Mass-like enlargement of the cervix measuring 3.8 x 3.0 x 2.9 cm. Remainder of the uterus and ovaries are otherwise unremarkable in appearance. Other: Trace volume of ascites. Extensive omental nodularity, most notable in the low anatomic pelvis. Bilateral inguinal hernias containing predominantly fat, although some of the lower omental nodularity extends into the right inguinal hernia. Small umbilical hernia containing omental fat. Trace volume of ascites. No pneumoperitoneum. Musculoskeletal: There are no aggressive appearing lytic or blastic lesions noted in the visualized portions of the skeleton. IMPRESSION: 1. No intra-abdominal abscess. 2. 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 Notably, the cervix has a mass-like appearance. Further evaluation with pelvic examination is recommended to exclude gynecologic malignancy. 3. Small left pleural effusion. 4. Colonic diverticulosis without evidence of acute diverticulitis at this time. 5. Additional incidental findings, as above. Electronically Signed   By: Vinnie Langton M.D.   On: 09/30/2020 11:55   US RENAL  Result Date: 10/09/2020 CLINICAL DATA:  Rising creatinine.  Advanced gyn malignancy EXAM: RENAL / URINARY TRACT ULTRASOUND COMPLETE COMPARISON:  CT abdomen pelvis 09/30/2020 FINDINGS: Right Kidney: Renal measurements: 10.8 x 5.2 x 5.6 cm = volume: 162 mL. Mild right hydronephrosis unchanged from prior CT. No renal mass. Left Kidney: Renal measurements: 11.1 x 5.2 x 5.2 cm = volume: 156 mL. Mild left hydronephrosis unchanged from recent CT.  No left renal mass. Bladder: Urinary jets not seen during 10 minutes of observation. Other: Trace ascites IMPRESSION: Mild bilateral renal obstruction likely due to distal ureteral obstruction. No change from recent CT. Electronically Signed   By: Franchot Gallo M.D.   On: 10/09/2020 16:17   US Venous Img Lower Bilateral  Result Date: 10/26/2020 CLINICAL DATA:  65 year old female with bilateral lower extremity edema. EXAM: BILATERAL LOWER EXTREMITY VENOUS DOPPLER ULTRASOUND TECHNIQUE: Gray-scale sonography with graded compression, as well as color Doppler and duplex ultrasound were performed to evaluate the lower extremity deep venous systems from the level of the common femoral vein and including the common femoral, femoral, profunda femoral, popliteal and calf veins including the posterior tibial, peroneal and gastrocnemius veins when visible. The superficial great saphenous vein was also interrogated. Spectral Doppler was utilized to evaluate flow at rest and with distal augmentation maneuvers in the common femoral, femoral and popliteal veins. COMPARISON:  None. FINDINGS: RIGHT LOWER EXTREMITY Common Femoral Vein: No evidence of thrombus. Normal compressibility, respiratory phasicity and response to augmentation. Saphenofemoral Junction: No evidence of thrombus. Normal compressibility and flow on color Doppler imaging. Profunda Femoral Vein: No evidence of thrombus. Normal compressibility and flow on color Doppler imaging. Femoral Vein: No evidence of thrombus. Normal compressibility, respiratory phasicity and response to augmentation. Popliteal Vein: No evidence of thrombus. Normal compressibility, respiratory phasicity and response to augmentation. Calf Veins: There is thrombus in the posterior tibial and peroneal veins with noncompressibility of the vessels. Five Superficial Great Saphenous Vein: No evidence of thrombus. Normal compressibility. Venous Reflux:  None. Other Findings:  None. LEFT LOWER  EXTREMITY Common Femoral Vein: No evidence  of thrombus. Normal compressibility, respiratory phasicity and response to augmentation. Saphenofemoral Junction: No evidence of thrombus. Normal compressibility and flow on color Doppler imaging. Profunda Femoral Vein: No evidence of thrombus. Normal compressibility and flow on color Doppler imaging. Femoral Vein: No evidence of thrombus. Normal compressibility, respiratory phasicity and response to augmentation. Popliteal Vein: No evidence of thrombus. Normal compressibility, respiratory phasicity and response to augmentation. Calf Veins: No evidence of thrombus. Normal compressibility and flow on color Doppler imaging. Superficial Great Saphenous Vein: No evidence of thrombus. Normal compressibility. Venous Reflux:  None. Other Findings:  None. IMPRESSION: 1. Occlusive thrombus in the right calf involving the posterior tibial and peroneal veins. 2. No DVT in the left lower extremity. These results were called by telephone at the time of interpretation on 10/26/2020 at 2:49 am to provider [employer] , who verbally acknowledged these results. Electronically Signed   By: Anner Crete M.D.   On: 10/26/2020 02:51   DG Chest Portable 1 View  Result Date: 10/26/2020 CLINICAL DATA:  Shortness of breath EXAM: PORTABLE CHEST 1 VIEW COMPARISON:  10/19/2020 FINDINGS: Mild increased opacity at the left lung base. No pulmonary edema. No pleural effusion or pneumothorax. Normal cardiomediastinal contours. IMPRESSION: Mild increased opacity at the left lung base may indicate atelectasis. Electronically Signed   By: Ulyses Jarred M.D.   On: 10/26/2020 00:56   DG Chest Port 1 View  Result Date: 10/19/2020 CLINICAL DATA:  65 year old female with shortness of breath. EXAM: PORTABLE CHEST 1 VIEW COMPARISON:  Portable chest 10/18/2020 and earlier. FINDINGS: Portable AP semi upright view at 0713 hours. Continued lower lung volumes than baseline compared to last month. Mediastinal  contours remain normal. Visualized tracheal air column is within normal limits. No pneumothorax. No pleural effusion. Mild crowding of lung markings including along the right minor fissure is stable since 10/15/2020. Paucity of bowel gas in the upper abdomen. No acute osseous abnormality identified. IMPRESSION: Stable low lung volumes and suspected mild atelectasis. No new cardiopulmonary abnormality. Electronically Signed   By: Genevie Ann M.D.   On: 10/19/2020 08:00   DG Chest Port 1 View  Result Date: 10/18/2020 CLINICAL DATA:  Fever. EXAM: PORTABLE CHEST 1 VIEW COMPARISON:  October 15, 2020. FINDINGS: The heart size and mediastinal contours are within normal limits. Right lung is clear. Minimal left basilar subsegmental atelectasis is noted and decreased compared to prior exam. The visualized skeletal structures are unremarkable. IMPRESSION: Improved left basilar subsegmental atelectasis. Electronically Signed   By: Marijo Conception M.D.   On: 10/18/2020 08:51   DG Chest Port 1 View  Result Date: 10/15/2020 CLINICAL DATA:  AK I EXAM: PORTABLE CHEST 1 VIEW COMPARISON:  Same day CT abdomen pelvis and chest radiograph September 04, 2020. FINDINGS: The heart size and mediastinal contours are within normal limits. Low lung volumes with bibasilar linear atelectasis. Small left pleural effusion with adjacent consolidative opacity. No visible pneumothorax. The visualized skeletal structures are unchanged. IMPRESSION: Small left pleural effusion with adjacent left lower lobe consolidative opacity, which may reflect atelectasis and/or infection. Electronically Signed   By: Dahlia Bailiff MD   On: 10/15/2020 16:03   DG OR UROLOGY CYSTO IMAGE ([place])  Result Date: 10/10/2020 There is no interpretation for this exam.  This order is for images obtained during a surgical procedure.  Please See "Surgeries" Tab for more information regarding the procedure.   US BREAST LTD UNI RIGHT INC AXILLA  Result Date:  10/01/2020 CLINICAL DATA:  65 year old female presenting  with a new palpable area of concern in the right breast. EXAM: DIGITAL DIAGNOSTIC UNILATERAL RIGHT MAMMOGRAM WITH TOMOSYNTHESIS AND CAD; ULTRASOUND RIGHT BREAST LIMITED TECHNIQUE: Right digital diagnostic mammography and breast tomosynthesis was performed. The images were evaluated with computer-aided detection.; Targeted ultrasound examination of the right breast was performed COMPARISON:  Previous exam(s). ACR Breast Density Category b: There are scattered areas of fibroglandular density. FINDINGS: Mammogram: A skin BB marks the site of concern reported by the patient in the upper outer right breast. A spot tangential view of this area is performed in addition to standard views. There is a round circumscribed mass with central fatty hilum measuring approximately 1.2 cm at the palpable site, most likely an intramammary lymph node. There are no additional new findings elsewhere in the right breast. On physical exam at site of concern reported by the patient I feel a discrete mass. Ultrasound: Targeted ultrasound is performed at the palpable site of concern in the right breast at 9:30 o'clock 9 cm from the nipple demonstrating a round circumscribed hypoechoic mass with central fatty hilum measuring 1.3 x 1.3 x 1.1 cm, consistent with a intramammary lymph node. There is cortical thickening measuring 0.5 cm. This corresponds to the mammographic finding. Targeted ultrasound of the right axilla demonstrates normal lymph nodes. IMPRESSION: Abnormal intramammary lymph node in the right breast at 9:30 o'clock at the palpable site reported by the patient. RECOMMENDATION: Ultrasound-guided core needle biopsy of the right breast mass at 9:30 o'clock. I have discussed the findings and recommendations with the patient who agrees to proceed with biopsy. The patient will be contacted by our scheduler to arrange the biopsy appointment. BI-RADS CATEGORY  4: Suspicious.  Electronically Signed   By: Audie Pinto M.D.   On: 10/01/2020 11:23   MM DIAG BREAST TOMO UNI RIGHT  Result Date: 10/01/2020 CLINICAL DATA:  65 year old female presenting with a new palpable area of concern in the right breast. EXAM: DIGITAL DIAGNOSTIC UNILATERAL RIGHT MAMMOGRAM WITH TOMOSYNTHESIS AND CAD; ULTRASOUND RIGHT BREAST LIMITED TECHNIQUE: Right digital diagnostic mammography and breast tomosynthesis was performed. The images were evaluated with computer-aided detection.; Targeted ultrasound examination of the right breast was performed COMPARISON:  Previous exam(s). ACR Breast Density Category b: There are scattered areas of fibroglandular density. FINDINGS: Mammogram: A skin BB marks the site of concern reported by the patient in the upper outer right breast. A spot tangential view of this area is performed in addition to standard views. There is a round circumscribed mass with central fatty hilum measuring approximately 1.2 cm at the palpable site, most likely an intramammary lymph node. There are no additional new findings elsewhere in the right breast. On physical exam at site of concern reported by the patient I feel a discrete mass. Ultrasound: Targeted ultrasound is performed at the palpable site of concern in the right breast at 9:30 o'clock 9 cm from the nipple demonstrating a round circumscribed hypoechoic mass with central fatty hilum measuring 1.3 x 1.3 x 1.1 cm, consistent with a intramammary lymph node. There is cortical thickening measuring 0.5 cm. This corresponds to the mammographic finding. Targeted ultrasound of the right axilla demonstrates normal lymph nodes. IMPRESSION: Abnormal intramammary lymph node in the right breast at 9:30 o'clock at the palpable site reported by the patient. RECOMMENDATION: Ultrasound-guided core needle biopsy of the right breast mass at 9:30 o'clock. I have discussed the findings and recommendations with the patient who agrees to proceed with  biopsy. The patient will be contacted by our scheduler  to arrange the biopsy appointment. BI-RADS CATEGORY  4: Suspicious. Electronically Signed   By: Audie Pinto M.D.   On: 10/01/2020 11:23   CT Renal Stone Study  Result Date: 10/15/2020 CLINICAL DATA:  Renal insufficiency. EXAM: CT ABDOMEN AND PELVIS WITHOUT CONTRAST TECHNIQUE: Multidetector CT imaging of the abdomen and pelvis was performed following the standard protocol without IV contrast. COMPARISON:  09/30/2020. FINDINGS: Lower chest: Basilar atelectasis again noted bilaterally with interval increase in posterior left lower lobe collapse/consolidative opacity. Small left pleural effusion has progressed in the interval. Hepatobiliary: No focal abnormality in the liver on this study without intravenous contrast. Layering sludge noted in the distended gallbladder. No intrahepatic or extrahepatic biliary dilation. Pancreas: No focal mass lesion. No dilatation of the main duct. No intraparenchymal cyst. No peripancreatic edema. Spleen: No splenomegaly. No focal mass lesion. Adrenals/Urinary Tract: No adrenal nodule or mass. Mild hydronephrosis in both kidneys is similar to prior with bilateral internal ureteral stents visualized. No associated hydroureter. The bladder is decompressed. Stomach/Bowel: Tiny hiatal hernia. Stomach otherwise unremarkable. Duodenum is normally positioned as is the ligament of Treitz. No small bowel wall thickening. No small bowel dilatation. The terminal ileum is normal. The appendix is not well visualized, but there is no edema or inflammation in the region of the cecum. No gross colonic mass. No colonic wall thickening. Diverticular changes are noted in the left colon without evidence of diverticulitis. Vascular/Lymphatic: No abdominal aortic aneurysm. No change in the retroperitoneal abdominopelvic lymphadenopathy, better characterized on the recent CT scan performed with intravenous contrast material. Reproductive: The  uterus is unremarkable. Similar prominence of the cervix. There is no adnexal mass. Other: Interval increase in small to moderate volume abdominopelvic ascites with edema and nodularity in the omentum inferiorly, as before. Musculoskeletal: Small bilateral groin hernias contain only fat. No worrisome lytic or sclerotic osseous abnormality. IMPRESSION: 1. Mild bilateral hydronephrosis is similar to prior, now with bilateral internal ureteral stents in situ. No evidence for hydroureter in the bladder is decompressed. 2. Similar appearance of upper normal to mildly enlarged retroperitoneal and pelvic sidewall lymphadenopathy. This was better evaluated on recent CT with IV contrast. 3. Interval progression of ascites, now small to moderate volume. There is associated edema/caking in the omentum concerning for metastatic disease. 4. Small bilateral groin hernias contain only fat. 5. Left colonic diverticulosis without diverticulitis. Electronically Signed   By: Misty Stanley M.D.   On: 10/15/2020 13:06    Lab Data:  CBC: Recent Labs  Lab 10/21/20 0331 10/26/20 0022 10/26/20 0440 10/27/20 0431  WBC 10.4 10.5 10.0 8.2  NEUTROABS  --  7.4  --   --   HGB 9.2* 9.7* 9.9* 9.5*  HCT 30.0* 31.8* 33.2* 30.6*  MCV 86.7 85.9 87.4 85.2  PLT 117* 346 316 237   Basic Metabolic Panel: Recent Labs  Lab 10/21/20 0331 10/25/20 1128 10/26/20 0022 10/26/20 0440 10/27/20 0431  NA 137 137 138 139 139  K 4.0 4.1 3.8 4.1 3.8  CL 105 103 106 105 109  CO2 25 22 19* 23 21*  GLUCOSE 100* 118* 107* 106* 112*  BUN 25* 25* 25* 25* 21  CREATININE 2.67* 3.26* 3.49* 3.49* 3.00*  CALCIUM 8.3* 8.5* 8.4* 8.7* 8.2*  MG  --   --  2.1  --   --    GFR: Estimated Creatinine Clearance: 22.3 mL/min (A) (by C-G formula based on SCr of 3 mg/dL (H)). Liver Function Tests: Recent Labs  Lab 10/26/20 0022  AST 19  ALT 12  ALKPHOS 99  BILITOT 0.7  PROT 6.9  ALBUMIN 2.5*   No results for input(s): LIPASE, AMYLASE in the  last 168 hours. No results for input(s): AMMONIA in the last 168 hours. Coagulation Profile: Recent Labs  Lab 10/26/20 0440  INR 1.2   Cardiac Enzymes: No results for input(s): CKTOTAL, CKMB, CKMBINDEX, TROPONINI in the last 168 hours. BNP (last 3 results) No results for input(s): PROBNP in the last 8760 hours. HbA1C: No results for input(s): HGBA1C in the last 72 hours. CBG: No results for input(s): GLUCAP in the last 168 hours. Lipid Profile: No results for input(s): CHOL, HDL, LDLCALC, TRIG, CHOLHDL, LDLDIRECT in the last 72 hours. Thyroid Function Tests: No results for input(s): TSH, T4TOTAL, FREET4, T3FREE, THYROIDAB in the last 72 hours. Anemia Panel: No results for input(s): VITAMINB12, FOLATE, FERRITIN, TIBC, IRON, RETICCTPCT in the last 72 hours. Urine analysis:    Component Value Date/Time   COLORURINE STRAW (A) 10/26/2020 1222   APPEARANCEUR HAZY (A) 10/26/2020 1222   LABSPEC 1.004 (L) 10/26/2020 1222   PHURINE 7.0 10/26/2020 1222   GLUCOSEU NEGATIVE 10/26/2020 1222   HGBUR MODERATE (A) 10/26/2020 1222   BILIRUBINUR NEGATIVE 10/26/2020 1222   KETONESUR NEGATIVE 10/26/2020 1222   PROTEINUR NEGATIVE 10/26/2020 1222   NITRITE NEGATIVE 10/26/2020 1222   LEUKOCYTESUR MODERATE (A) 10/26/2020 1222     Harjot Zavadil M.D. [facility] 10/27/2020, 1:21 PM  Available via Epic secure chat 7am-7pm After 7 pm, please refer to night coverage provider listed on amion.

## 2020-10-27 NOTE — Plan of Care (Signed)
  Problem: Health Behavior/Discharge Planning: Goal: Ability to manage health-related needs will improve Outcome: Progressing   Problem: Clinical Measurements: Goal: Respiratory complications will improve Outcome: Progressing   Problem: Clinical Measurements: Goal: Cardiovascular complication will be avoided Outcome: Progressing   Problem: Safety: Goal: Ability to remain free from injury will improve Outcome: Progressing   

## 2020-10-28 ENCOUNTER — Encounter: Admission: RE | Admit: 2020-10-28 | Payer: BC Managed Care – PPO | Source: Ambulatory Visit

## 2020-10-28 ENCOUNTER — Inpatient Hospital Stay: Payer: BC Managed Care – PPO

## 2020-10-28 ENCOUNTER — Other Ambulatory Visit: Payer: Self-pay

## 2020-10-28 LAB — BASIC METABOLIC PANEL
Anion gap: 10 (ref 5–15)
BUN: 19 mg/dL (ref 8–23)
CO2: 20 mmol/L — ABNORMAL LOW (ref 22–32)
Calcium: 8.4 mg/dL — ABNORMAL LOW (ref 8.9–10.3)
Chloride: 110 mmol/L (ref 98–111)
Creatinine, Ser: 2.97 mg/dL — ABNORMAL HIGH (ref 0.44–1.00)
GFR, Estimated: 17 mL/min — ABNORMAL LOW (ref >60)
Glucose, Bld: 109 mg/dL — ABNORMAL HIGH (ref 70–99)
Potassium: 3.6 mmol/L (ref 3.5–5.1)
Sodium: 140 mmol/L (ref 135–145)

## 2020-10-28 LAB — CBC
HCT: 32.2 % — ABNORMAL LOW (ref 36.0–46.0)
Hemoglobin: 9.8 g/dL — ABNORMAL LOW (ref 12.0–15.0)
MCH: 25.9 pg — ABNORMAL LOW (ref 26.0–34.0)
MCHC: 30.4 g/dL (ref 30.0–36.0)
MCV: 85 fL (ref 80.0–100.0)
Platelets: 354 10*3/uL (ref 150–400)
RBC: 3.79 MIL/uL — ABNORMAL LOW (ref 3.87–5.11)
RDW: 14.4 % (ref 11.5–15.5)
WBC: 8.7 10*3/uL (ref 4.0–10.5)
nRBC: 0 % (ref 0.0–0.2)

## 2020-10-28 LAB — HEPARIN LEVEL (UNFRACTIONATED): Heparin Unfractionated: 0.41 IU/mL (ref 0.30–0.70)

## 2020-10-28 LAB — SURGICAL PATHOLOGY

## 2020-10-28 LAB — PROTIME-INR
INR: 1.1 (ref 0.8–1.2)
Prothrombin Time: 14.5 seconds (ref 11.4–15.2)

## 2020-10-28 MED ORDER — ENOXAPARIN SODIUM 120 MG/0.8ML IJ SOSY: 120.0000 mg | PREFILLED_SYRINGE | Freq: Two times a day (BID) | INTRAMUSCULAR | 0 refills | Status: DC

## 2020-10-28 MED ORDER — WARFARIN SODIUM 7.5 MG PO TABS
7.5000 mg | ORAL_TABLET | Freq: Once | ORAL | Status: DC
  Filled 2020-10-28: qty 1

## 2020-10-28 MED ORDER — ENOXAPARIN SODIUM 120 MG/0.8ML IJ SOSY: 120.0000 mg | PREFILLED_SYRINGE | Freq: Two times a day (BID) | INTRAMUSCULAR | 0 refills | Status: AC

## 2020-10-28 MED ORDER — WARFARIN SODIUM 5 MG PO TABS: ORAL_TABLET | ORAL | 11 refills | Status: AC

## 2020-10-28 MED ORDER — ENOXAPARIN SODIUM 120 MG/0.8ML IJ SOSY
120.0000 mg | PREFILLED_SYRINGE | Freq: Two times a day (BID) | INTRAMUSCULAR | Status: DC
  Administered 2020-10-28: 120 mg via SUBCUTANEOUS
  Filled 2020-10-28 (×2): qty 0.8

## 2020-10-28 NOTE — Discharge Summary (Signed)
Physician Discharge Summary   Patient ID: Tracy Goodman MRN: [id number] DOB/AGE: [birth year] 65 y.o.  Admit date: 10/26/2020 Discharge date: 10/28/2020  Primary Care Physician:  [name], MD   Recommendations for Outpatient Follow-up:  1. Follow up with PCP in 1-2 weeks 2. Patient will continue Lovenox bridge 120 mg 2 times daily with Coumadin until INR therapeutic between 2-3 3. Recommended to continue on Coumadin 7.5 mg daily for 3 days then continue 5 mg daily.  Home Health: None Equipment/Devices:   Discharge Condition: stable CODE STATUS: FULL Diet recommendation: Heart healthy diet   Discharge Diagnoses:    . Acute deep vein thrombosis (DVT) of right lower extremity (HCC)   Acute left lung PE  Small left pleural effusion Acute kidney injury superimposed on CKD stage IV Essential hypertension Metastatic ovarian CA, omental carcinomatosis, retroperitoneal and pelvic adenopathy Anemia of chronic disease/malignancy Morbid obesity  Consults:  None    Allergies:  No Known Allergies   DISCHARGE MEDICATIONS: Allergies as of 10/28/2020   No Known Allergies     Medication List    STOP taking these medications   ciprofloxacin 500 MG tablet Commonly known as: CIPRO     TAKE these medications   acetaminophen 325 MG tablet Commonly known as: TYLENOL Take 2 tablets (650 mg total) by mouth every 6 (six) hours as needed for mild pain, fever or headache (or Fever >/= 101).   amLODipine 10 MG tablet Commonly known as: NORVASC Take 1 tablet (10 mg total) by mouth daily.   enoxaparin 120 MG/0.8ML injection Commonly known as: LOVENOX Inject 0.8 mLs (120 mg total) into the skin 2 (two) times daily for 7 days. Continue Lovenox injections until the INR is between 2 -3   saccharomyces boulardii 250 MG capsule Commonly known as: FLORASTOR Take 1 capsule (250 mg total) by mouth 2 (two) times daily for 7 days.   warfarin 5 MG tablet Commonly known as:  Coumadin Please take 1-1/2 tablet (7.5 mg) for 3 days, then continue 5 mg daily.  Coumadin dose will be adjusted by your doctor at the appointment.        Brief H and P: For complete details please refer to admission H and P, but in brief * Patient is a 65 year old female with history of metastatic ovarian CA, hypertension, diverticulosis with diverticulitis, CKD stage IV presented to ED with shortness of breath, associated bilateral lower extremity swelling for last couple days.  She denied any fevers or chills. In ED, sinus tachycardia with heart rate 119  BP 137/102,  RR 24  procalcitonin 2.83, D-dimer was elevated.  Venous Doppler of right lower extremity positive for DVT.   Hospital Course:   Acute right lower extremity DVT with likely acute left lung PE, small left pleural effusion.  - In the setting of metastatic ovarian CA, presented with shortness of breath and bilateral lower extremity edema -Patient was placed on IV heparin drip.   VQ scan positive for acute left lung PE -Procalcitonin 2.83, does not have any fevers or leukocytosis.  UA negative for UTI.  Blood cultures negative so far.  Chest x-ray did not show any pneumonia. - d/w vascular surgery on-call, Dr Feliberto Gottron (no formal consult), given patient has CKD stage IV with baseline creatinine around 3.5, recommended heparin/Lovenox bridge with Coumadin over NOAC's  -Patient was started on Lovenox bridge with Coumadin.  Lovenox teaching was provided.  She will continue Lovenox until INR is therapeutic.  Further adjustments of the Coumadin dose  per PCP.  Acute kidney injury superimposed on stage IV CKD -Baseline creatinine ~3.5-3.8, was 5.1 on 4/28 and has been improving.   -On admission creatinine 3.49 -Renal ultrasound 10/09/2020 showed mild bilateral renal obstruction likely due to distal ureteral obstruction - patient had undergone cystoscopy with stent placement in 09/2020 by urology, Dr. Erlene Quan. -UA negative for  UTI -Continues to improve, 2.9 at the time of discharge.  Essential hypertension -Currently on Norvasc 10 mg daily.  Metastatic ovarian CA omental carcinomatosis, retroperitoneal and pelvic adenopathy -Continue outpatient follow-up with oncology,  Normocytic anemia, likely anemia of chronic disease/malignancy -Hemoglobin remained stable, 9.8 at the time of discharge  Morbid obesity Estimated body mass index is 53.12 kg/m as calculated from the following:   Height as of this encounter: 4\' 11"  (1.499 m).   Weight as of this encounter: 119.3 kg    Day of Discharge S: Doing well, started on Coumadin.  Hoping to go home today.  Appears to be comfortable, no pain, no shortness of breath.  BP 134/85   Pulse (!) 104   Temp 98.3 F (36.8 C) (Oral)   Resp 18   Ht 4\' 11"  (1.499 m)   Wt 117.6 kg   SpO2 95%   BMI 52.35 kg/m   Physical Exam: General: Alert and awake oriented x3 not in any acute distress. CVS: S1-S2 clear no murmur rubs or gallops Chest: clear to auscultation bilaterally, no wheezing rales or rhonchi Abdomen: soft nontender, nondistended, normal bowel sounds Extremities: no cyanosis, clubbing or edema noted bilaterally Neuro: Cranial nerves II-XII intact, no focal neurological deficits    Get Medicines reviewed and adjusted: Please take all your medications with you for your next visit with your Primary MD  Please request your Primary MD to go over all hospital tests and procedure/radiological results at the follow up. Please ask your Primary MD to get all Hospital records sent to his/her office.  If you experience worsening of your admission symptoms, develop shortness of breath, life threatening emergency, suicidal or homicidal thoughts you must seek medical attention immediately by calling 911 or calling your MD immediately  if symptoms less severe.  You must read complete instructions/literature along with all the possible adverse reactions/side effects  for all the Medicines you take and that have been prescribed to you. Take any new Medicines after you have completely understood and accept all the possible adverse reactions/side effects.   Do not drive when taking pain medications.   Do not take more than prescribed Pain, Sleep and Anxiety Medications  Special Instructions: If you have smoked or chewed Tobacco  in the last 2 yrs please stop smoking, stop any regular Alcohol  and or any Recreational drug use.  Wear Seat belts while driving.  Please note  You were cared for by a hospitalist during your hospital stay. Once you are discharged, your primary care physician will handle any further medical issues. Please note that NO REFILLS for any discharge medications will be authorized once you are discharged, as it is imperative that you return to your primary care physician (or establish a relationship with a primary care physician if you do not have one) for your aftercare needs so that they can reassess your need for medications and monitor your lab values.   The results of significant diagnostics from this hospitalization (including imaging, microbiology, ancillary and laboratory) are listed below for reference.      Procedures/Studies:  CT ABDOMEN PELVIS WO CONTRAST  Result Date: 09/30/2020 CLINICAL DATA:  65 year old female with abdominal pain greater on the left. Recent diverticulitis status post full course of antibiotics. Two episodes of bright red blood in stool yesterday. EXAM: CT ABDOMEN AND PELVIS WITHOUT CONTRAST TECHNIQUE: Multidetector CT imaging of the abdomen and pelvis was performed following the standard protocol without IV contrast. COMPARISON:  CT Abdomen and Pelvis 09/04/2020. FINDINGS: Lower chest: Small layering left pleural effusion is new since last month. No cardiomegaly or pericardial effusion. Mild lung base atelectasis. Hepatobiliary: Trace perihepatic free fluid is new since last month. Otherwise noncontrast  liver and gallbladder appear stable. Pancreas: Negative. Spleen: Small volume perisplenic free fluid is new since last month. Simple fluid density. Otherwise stable noncontrast spleen. Adrenals/Urinary Tract: Normal adrenal glands. New bilateral hydronephrosis (series 2, image 32). No nephrolithiasis. Both ureters are difficult to delineate at the pelvic inlet where to generalized increased inflammation is noted from last month. Urinary bladder is diminutive. Stomach/Bowel: New free fluid layering in both gutters greater on the left. Simple fluid density on that side (coronal image 90). Generalized mesenteric stranding in the distal omentum (coronal image 74 and series 2, image 64) superficial to the uterus and adnexa which are abnormal, described below. No dilated small or large bowel loops. Diverticulosis of the large bowel. Sigmoid colon is indistinct and inseparable from the uterine fundus on sagittal image 116. No pneumoperitoneum is identified. Negative terminal ileum. Appendix cannot be delineated but likely courses toward the uterine fundus. Decompressed stomach and duodenum. Vascular/Lymphatic: Aortoiliac calcified atherosclerosis. Normal caliber abdominal aorta. Vascular patency is not evaluated in the absence of IV contrast. Reproductive: Moderate generalized parametrial inflammation has increased (series 2, image 63). Continued gas in the vagina. No gas identified within the endometrium. Other: Increased presacral stranding since last month. No layering pelvic free fluid. Small fat containing inguinal hernias are stable. Musculoskeletal: Lumbar facet arthropathy. No acute osseous abnormality identified. IMPRESSION: 1. Small volume new free fluid in the abdomen and pelvis. No free air identified. Increased and generalized inflammation at the pelvic inlet, in the distal omentum, and about the uterus and ovaries. Sigmoid colon appears indistinct and inseparable from the uterine fundus, with persistent gas  within the vagina. Appendix not delineated. New bilateral hydronephrosis. 2. Recommend repeat CT Abdomen and Pelvis with oral and IV contrast to further characterize (and recommend sufficient oral contrast and timing delay to ensure contrast has reached the sigmoid colon). 3. Small new layering left pleural effusion. Electronically Signed   By: Genevie Ann M.D.   On: 09/30/2020 06:28   NM Pulmonary Perfusion  Addendum Date: 10/26/2020   ADDENDUM REPORT: 10/26/2020 09:28 ADDENDUM: Study discussed by telephone with Ricardo Schubach MD on 10/26/2020 at 0923 hours. The patient is being actively anticoagulated for DVT. We also discussed that I estimate the volume of left pleural effusion is small based on the recent comparisons. Electronically Signed   By: Genevie Ann M.D.   On: 10/26/2020 09:28   Result Date: 10/26/2020 CLINICAL DATA:  65 year old female with shortness of breath. Positive right calf DVT. EXAM: NUCLEAR MEDICINE PERFUSION LUNG SCAN TECHNIQUE: Perfusion images were obtained in multiple projections after intravenous injection of radiopharmaceutical. Ventilation scans intentionally deferred if perfusion scan and chest x-ray adequate for interpretation during COVID 19 epidemic. RADIOPHARMACEUTICALS:  4.4 mCi Tc-50m MAA IV COMPARISON:  Portable chest 0032 hours today. CT Abdomen and Pelvis 10-Nov-2020. FINDINGS: Pronounced asymmetry, decreased perfusion activity throughout much of the left lung. Left pleural effusion on recent comparison Scala although fairly small. Additional more discrete perfusion defects in  the left upper lung on the anterior and oblique images. Fairly homogeneous activity in the right lung. IMPRESSION: Asymmetric decreased activity in the left lung, both global and focal, which seems out of proportion to that expected from the left pleural effusion seen on recent comparisons. Therefore this exam is suspicious for Acute PE. Electronically Signed: By: Genevie Ann M.D. On: 10/26/2020 09:18   CT ABDOMEN  PELVIS W CONTRAST  Result Date: 09/30/2020 CLINICAL DATA:  65 year old female with history of left lower abdominal pain. Suspected abdominal abscess or infection. EXAM: CT ABDOMEN AND PELVIS WITH CONTRAST TECHNIQUE: Multidetector CT imaging of the abdomen and pelvis was performed using the standard protocol following bolus administration of intravenous contrast. CONTRAST:  17mL OMNIPAQUE IOHEXOL 300 MG/ML  SOLN COMPARISON:  CT of the abdomen and pelvis 10/01/2018 to. FINDINGS: Lower chest: Small left pleural effusion lying dependently with some associated passive subsegmental atelectasis in the left lower lobe. Scarring or subsegmental atelectasis in the right middle lobe also noted. Hepatobiliary: No suspicious cystic or solid hepatic lesions. No intra or extrahepatic biliary ductal dilatation. Gallbladder is normal in appearance. Pancreas: Fatty infiltration in the pancreas. No pancreatic mass. No pancreatic ductal dilatation. No pancreatic or peripancreatic fluid collections or inflammatory changes. Spleen: Unremarkable. Adrenals/Urinary Tract: Bilateral kidneys and adrenal glands are normal in appearance. No hydroureteronephrosis. Urinary bladder is nearly completely collapsed, but otherwise unremarkable in appearance. Stomach/Bowel: Stomach is completely decompressed, but otherwise unremarkable in appearance. No pathologic dilatation of small bowel or colon. Numerous colonic diverticulae are noted, without surrounding inflammatory changes to clearly indicate an acute diverticulitis at this time. Appendix is not confidently identified may be surgically absent. Vascular/Lymphatic: Atherosclerosis in the pelvic vasculature. No aneurysm or dissection noted in the abdominal or pelvic vasculature. Multiple prominent borderline enlarged and mildly enlarged retroperitoneal and pelvic lymph nodes. Specific examples include a left para-aortic lymph node measuring 1 cm in short axis adjacent to the left renal hilum  (axial image 33 of series 2), 1.3 cm in short axis in the left para-aortic nodal station inferior to the left renal hilum (axial image 40 of series 2), 1 cm in short axis in the right external iliac nodal station (axial images 62 and 64 of series 2) and 1.3 cm in short axis in the right common femoral nodal station (axial image 75 of series 2). Borderline enlarged mesorectal lymph node on the right (axial image 75 of series 2) measuring 9 mm in short axis. Reproductive: Mass-like enlargement of the cervix measuring 3.8 x 3.0 x 2.9 cm. Remainder of the uterus and ovaries are otherwise unremarkable in appearance. Other: Trace volume of ascites. Extensive omental nodularity, most notable in the low anatomic pelvis. Bilateral inguinal hernias containing predominantly fat, although some of the lower omental nodularity extends into the right inguinal hernia. Small umbilical hernia containing omental fat. Trace volume of ascites. No pneumoperitoneum. Musculoskeletal: There are no aggressive appearing lytic or blastic lesions noted in the visualized portions of the skeleton. IMPRESSION: 1. No intra-abdominal abscess. 2. 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 Notably, the cervix has a mass-like appearance. Further evaluation with pelvic examination is recommended to exclude gynecologic malignancy. 3. Small left pleural effusion. 4. Colonic diverticulosis without evidence of acute diverticulitis at this time. 5. Additional incidental findings, as above. Electronically Signed   By: Vinnie Langton M.D.   On: 09/30/2020 11:55  US RENAL  Result Date: 10/09/2020 CLINICAL DATA:  Rising creatinine.  Advanced gyn malignancy EXAM: RENAL / URINARY TRACT ULTRASOUND COMPLETE COMPARISON:  CT abdomen pelvis 09/30/2020 FINDINGS: Right  Kidney: Renal measurements: 10.8 x 5.2 x 5.6 cm = volume: 162 mL. Mild right hydronephrosis unchanged from prior CT. No renal mass. Left Kidney: Renal measurements: 11.1 x 5.2 x 5.2 cm = volume: 156 mL. Mild left hydronephrosis unchanged from recent CT. No left renal mass. Bladder: Urinary jets not seen during 10 minutes of observation. Other: Trace ascites IMPRESSION: Mild bilateral renal obstruction likely due to distal ureteral obstruction. No change from recent CT. Electronically Signed   By: Franchot Gallo M.D.   On: 10/09/2020 16:17   US Venous Img Lower Bilateral  Result Date: 10/26/2020 CLINICAL DATA:  65 year old female with bilateral lower extremity edema. EXAM: BILATERAL LOWER EXTREMITY VENOUS DOPPLER ULTRASOUND TECHNIQUE: Gray-scale sonography with graded compression, as well as color Doppler and duplex ultrasound were performed to evaluate the lower extremity deep venous systems from the level of the common femoral vein and including the common femoral, femoral, profunda femoral, popliteal and calf veins including the posterior tibial, peroneal and gastrocnemius veins when visible. The superficial great saphenous vein was also interrogated. Spectral Doppler was utilized to evaluate flow at rest and with distal augmentation maneuvers in the common femoral, femoral and popliteal veins. COMPARISON:  None. FINDINGS: RIGHT LOWER EXTREMITY Common Femoral Vein: No evidence of thrombus. Normal compressibility, respiratory phasicity and response to augmentation. Saphenofemoral Junction: No evidence of thrombus. Normal compressibility and flow on color Doppler imaging. Profunda Femoral Vein: No evidence of thrombus. Normal compressibility and flow on color Doppler imaging. Femoral Vein: No evidence of thrombus. Normal compressibility, respiratory phasicity and response to augmentation. Popliteal Vein: No evidence of thrombus. Normal compressibility, respiratory phasicity and response to augmentation. Calf  Veins: There is thrombus in the posterior tibial and peroneal veins with noncompressibility of the vessels. Five Superficial Great Saphenous Vein: No evidence of thrombus. Normal compressibility. Venous Reflux:  None. Other Findings:  None. LEFT LOWER EXTREMITY Common Femoral Vein: No evidence of thrombus. Normal compressibility, respiratory phasicity and response to augmentation. Saphenofemoral Junction: No evidence of thrombus. Normal compressibility and flow on color Doppler imaging. Profunda Femoral Vein: No evidence of thrombus. Normal compressibility and flow on color Doppler imaging. Femoral Vein: No evidence of thrombus. Normal compressibility, respiratory phasicity and response to augmentation. Popliteal Vein: No evidence of thrombus. Normal compressibility, respiratory phasicity and response to augmentation. Calf Veins: No evidence of thrombus. Normal compressibility and flow on color Doppler imaging. Superficial Great Saphenous Vein: No evidence of thrombus. Normal compressibility. Venous Reflux:  None. Other Findings:  None. IMPRESSION: 1. Occlusive thrombus in the right calf involving the posterior tibial and peroneal veins. 2. No DVT in the left lower extremity. These results were called by telephone at the time of interpretation on 10/26/2020 at 2:49 am to provider [facility] , who verbally acknowledged these results. Electronically Signed   By: Anner Crete M.D.   On: 10/26/2020 02:51   DG Chest Portable 1 View  Result Date: 10/26/2020 CLINICAL DATA:  Shortness of breath EXAM: PORTABLE CHEST 1 VIEW COMPARISON:  10/19/2020 FINDINGS: Mild increased opacity at the left lung base. No pulmonary edema. No pleural effusion or pneumothorax. Normal cardiomediastinal contours. IMPRESSION: Mild increased opacity at the left lung base may indicate atelectasis. Electronically Signed   By: Ulyses Jarred M.D.   On: 10/26/2020 00:56   DG Chest [facility] 1 View  Result  Date: 10/19/2020 CLINICAL DATA:   65 year old female with shortness of breath. EXAM: PORTABLE CHEST 1 VIEW COMPARISON:  Portable chest 10/18/2020 and earlier. FINDINGS: Portable AP semi upright view at 0713 hours. Continued lower lung volumes than baseline compared to last month. Mediastinal contours remain normal. Visualized tracheal air column is within normal limits. No pneumothorax. No pleural effusion. Mild crowding of lung markings including along the right minor fissure is stable since 10/15/2020. Paucity of bowel gas in the upper abdomen. No acute osseous abnormality identified. IMPRESSION: Stable low lung volumes and suspected mild atelectasis. No new cardiopulmonary abnormality. Electronically Signed   By: Genevie Ann M.D.   On: 10/19/2020 08:00   DG Chest Port 1 View  Result Date: 10/18/2020 CLINICAL DATA:  Fever. EXAM: PORTABLE CHEST 1 VIEW COMPARISON:  October 15, 2020. FINDINGS: The heart size and mediastinal contours are within normal limits. Right lung is clear. Minimal left basilar subsegmental atelectasis is noted and decreased compared to prior exam. The visualized skeletal structures are unremarkable. IMPRESSION: Improved left basilar subsegmental atelectasis. Electronically Signed   By: Marijo Conception M.D.   On: 10/18/2020 08:51   DG Chest Port 1 View  Result Date: 10/15/2020 CLINICAL DATA:  AK I EXAM: PORTABLE CHEST 1 VIEW COMPARISON:  Same day CT abdomen pelvis and chest radiograph September 04, 2020. FINDINGS: The heart size and mediastinal contours are within normal limits. Low lung volumes with bibasilar linear atelectasis. Small left pleural effusion with adjacent consolidative opacity. No visible pneumothorax. The visualized skeletal structures are unchanged. IMPRESSION: Small left pleural effusion with adjacent left lower lobe consolidative opacity, which may reflect atelectasis and/or infection. Electronically Signed   By: Dahlia Bailiff MD   On: 10/15/2020 16:03   DG OR UROLOGY CYSTO IMAGE ([place])  Result Date:  10/10/2020 There is no interpretation for this exam.  This order is for images obtained during a surgical procedure.  Please See "Surgeries" Tab for more information regarding the procedure.   US BREAST LTD UNI RIGHT INC AXILLA  Result Date: 10/01/2020 CLINICAL DATA:  65 year old female presenting with a new palpable area of concern in the right breast. EXAM: DIGITAL DIAGNOSTIC UNILATERAL RIGHT MAMMOGRAM WITH TOMOSYNTHESIS AND CAD; ULTRASOUND RIGHT BREAST LIMITED TECHNIQUE: Right digital diagnostic mammography and breast tomosynthesis was performed. The images were evaluated with computer-aided detection.; Targeted ultrasound examination of the right breast was performed COMPARISON:  Previous exam(s). ACR Breast Density Category b: There are scattered areas of fibroglandular density. FINDINGS: Mammogram: A skin BB marks the site of concern reported by the patient in the upper outer right breast. A spot tangential view of this area is performed in addition to standard views. There is a round circumscribed mass with central fatty hilum measuring approximately 1.2 cm at the palpable site, most likely an intramammary lymph node. There are no additional new findings elsewhere in the right breast. On physical exam at site of concern reported by the patient I feel a discrete mass. Ultrasound: Targeted ultrasound is performed at the palpable site of concern in the right breast at 9:30 o'clock 9 cm from the nipple demonstrating a round circumscribed hypoechoic mass with central fatty hilum measuring 1.3 x 1.3 x 1.1 cm, consistent with a intramammary lymph node. There is cortical thickening measuring 0.5 cm. This corresponds to the mammographic finding. Targeted ultrasound of the right axilla demonstrates normal lymph nodes. IMPRESSION: Abnormal intramammary lymph node in the right breast at 9:30 o'clock at the palpable site reported by the patient. RECOMMENDATION:  Ultrasound-guided core needle biopsy of the right breast  mass at 9:30 o'clock. I have discussed the findings and recommendations with the patient who agrees to proceed with biopsy. The patient will be contacted by our scheduler to arrange the biopsy appointment. BI-RADS CATEGORY  4: Suspicious. Electronically Signed   By: Audie Pinto M.D.   On: 10/01/2020 11:23   MM DIAG BREAST TOMO UNI RIGHT  Result Date: 10/01/2020 CLINICAL DATA:  65 year old female presenting with a new palpable area of concern in the right breast. EXAM: DIGITAL DIAGNOSTIC UNILATERAL RIGHT MAMMOGRAM WITH TOMOSYNTHESIS AND CAD; ULTRASOUND RIGHT BREAST LIMITED TECHNIQUE: Right digital diagnostic mammography and breast tomosynthesis was performed. The images were evaluated with computer-aided detection.; Targeted ultrasound examination of the right breast was performed COMPARISON:  Previous exam(s). ACR Breast Density Category b: There are scattered areas of fibroglandular density. FINDINGS: Mammogram: A skin BB marks the site of concern reported by the patient in the upper outer right breast. A spot tangential view of this area is performed in addition to standard views. There is a round circumscribed mass with central fatty hilum measuring approximately 1.2 cm at the palpable site, most likely an intramammary lymph node. There are no additional new findings elsewhere in the right breast. On physical exam at site of concern reported by the patient I feel a discrete mass. Ultrasound: Targeted ultrasound is performed at the palpable site of concern in the right breast at 9:30 o'clock 9 cm from the nipple demonstrating a round circumscribed hypoechoic mass with central fatty hilum measuring 1.3 x 1.3 x 1.1 cm, consistent with a intramammary lymph node. There is cortical thickening measuring 0.5 cm. This corresponds to the mammographic finding. Targeted ultrasound of the right axilla demonstrates normal lymph nodes. IMPRESSION: Abnormal intramammary lymph node in the right breast at 9:30 o'clock at  the palpable site reported by the patient. RECOMMENDATION: Ultrasound-guided core needle biopsy of the right breast mass at 9:30 o'clock. I have discussed the findings and recommendations with the patient who agrees to proceed with biopsy. The patient will be contacted by our scheduler to arrange the biopsy appointment. BI-RADS CATEGORY  4: Suspicious. Electronically Signed   By: Audie Pinto M.D.   On: 10/01/2020 11:23   CT Renal Stone Study  Result Date: 10/15/2020 CLINICAL DATA:  Renal insufficiency. EXAM: CT ABDOMEN AND PELVIS WITHOUT CONTRAST TECHNIQUE: Multidetector CT imaging of the abdomen and pelvis was performed following the standard protocol without IV contrast. COMPARISON:  09/30/2020. FINDINGS: Lower chest: Basilar atelectasis again noted bilaterally with interval increase in posterior left lower lobe collapse/consolidative opacity. Small left pleural effusion has progressed in the interval. Hepatobiliary: No focal abnormality in the liver on this study without intravenous contrast. Layering sludge noted in the distended gallbladder. No intrahepatic or extrahepatic biliary dilation. Pancreas: No focal mass lesion. No dilatation of the main duct. No intraparenchymal cyst. No peripancreatic edema. Spleen: No splenomegaly. No focal mass lesion. Adrenals/Urinary Tract: No adrenal nodule or mass. Mild hydronephrosis in both kidneys is similar to prior with bilateral internal ureteral stents visualized. No associated hydroureter. The bladder is decompressed. Stomach/Bowel: Tiny hiatal hernia. Stomach otherwise unremarkable. Duodenum is normally positioned as is the ligament of Treitz. No small bowel wall thickening. No small bowel dilatation. The terminal ileum is normal. The appendix is not well visualized, but there is no edema or inflammation in the region of the cecum. No gross colonic mass. No colonic wall thickening. Diverticular changes are noted in the left colon without evidence of  diverticulitis. Vascular/Lymphatic: No abdominal aortic aneurysm. No change in the retroperitoneal abdominopelvic lymphadenopathy, better characterized on the recent CT scan performed with intravenous contrast material. Reproductive: The uterus is unremarkable. Similar prominence of the cervix. There is no adnexal mass. Other: Interval increase in small to moderate volume abdominopelvic ascites with edema and nodularity in the omentum inferiorly, as before. Musculoskeletal: Small bilateral groin hernias contain only fat. No worrisome lytic or sclerotic osseous abnormality. IMPRESSION: 1. Mild bilateral hydronephrosis is similar to prior, now with bilateral internal ureteral stents in situ. No evidence for hydroureter in the bladder is decompressed. 2. Similar appearance of upper normal to mildly enlarged retroperitoneal and pelvic sidewall lymphadenopathy. This was better evaluated on recent CT with IV contrast. 3. Interval progression of ascites, now small to moderate volume. There is associated edema/caking in the omentum concerning for metastatic disease. 4. Small bilateral groin hernias contain only fat. 5. Left colonic diverticulosis without diverticulitis. Electronically Signed   By: Misty Stanley M.D.   On: 10/15/2020 13:06       LAB RESULTS: Basic Metabolic Panel: Recent Labs  Lab 10/26/20 0022 10/26/20 0440 10/27/20 0431 10/28/20 0623  NA 138   < > 139 140  K 3.8   < > 3.8 3.6  CL 106   < > 109 110  CO2 19*   < > 21* 20*  GLUCOSE 107*   < > 112* 109*  BUN 25*   < > 21 19  CREATININE 3.49*   < > 3.00* 2.97*  CALCIUM 8.4*   < > 8.2* 8.4*  MG 2.1  --   --   --    < > = values in this interval not displayed.   Liver Function Tests: Recent Labs  Lab 10/26/20 0022  AST 19  ALT 12  ALKPHOS 99  BILITOT 0.7  PROT 6.9  ALBUMIN 2.5*   No results for input(s): LIPASE, AMYLASE in the last 168 hours. No results for input(s): AMMONIA in the last 168 hours. CBC: Recent Labs  Lab  10/26/20 0022 10/26/20 0440 10/27/20 0431 10/28/20 0623  WBC 10.5   < > 8.2 8.7  NEUTROABS 7.4  --   --   --   HGB 9.7*   < > 9.5* 9.8*  HCT 31.8*   < > 30.6* 32.2*  MCV 85.9   < > 85.2 85.0  PLT 346   < > 343 354   < > = values in this interval not displayed.   Cardiac Enzymes: No results for input(s): CKTOTAL, CKMB, CKMBINDEX, TROPONINI in the last 168 hours. BNP: Invalid input(s): POCBNP CBG: No results for input(s): GLUCAP in the last 168 hours.     Disposition and Follow-up: Discharge Instructions    Diet - low sodium heart healthy   Complete by: As directed    Discharge instructions   Complete by: As directed    Please continue Lovenox injections twice a day with Coumadin until INR is 2-3.  Take Coumadin 1-1/2 tablets (7.5 mg) by mouth for 3 days then continue 1 tablet (5 mg) daily.  Please follow instructions by your primary care physician.  You need to have PT/INR checked on Thursday or Friday.   Increase activity slowly   Complete by: As directed        DISPOSITION: [place]    [name], MD. Schedule an appointment as soon as possible for a visit in 1 week(s).   Specialty: Family Medicine  Why: Please make a follow-up appointment on Thursday 5/12 or Friday 5/13 for PT/INR check. Contact information: [place] [address] [phone]                Time coordinating discharge:  35 minutes  Signed:   Estill Cotta M.D. [facility] 10/28/2020, 3:23 PM

## 2020-10-28 NOTE — Progress Notes (Addendum)
[place] for Lovenox Dosing Indication: [place] for PE from known DVT  No Known Allergies  Patient Measurements: Height: 4\' 11"  (149.9 cm) Weight: 117.6 kg (259 lb 3.2 oz) IBW/kg (Calculated) : 43.2 Heparin Dosing Weight: 72.3 kg  Vital Signs: Temp: 98.2 F (36.8 C) (05/09 0350) Temp Source: Oral (05/09 0350) BP: 144/89 (05/09 0352)  Labs: Recent Labs    10/26/20 0022 10/26/20 0440 10/26/20 0949 10/27/20 0431 10/27/20 1350 10/27/20 1949 10/28/20 0623  HGB 9.7* 9.9*  --  9.5*  --   --  9.8*  HCT 31.8* 33.2*  --  30.6*  --   --  32.2*  PLT 346 316  --  343  --   --  354  APTT  --  127*  --   --   --   --   --   LABPROT  --  14.9  --   --   --   --  14.5  INR  --  1.2  --   --   --   --  1.1  HEPARINUNFRC  --   --    < > 0.25* 0.41 0.42 0.41  CREATININE 3.49* 3.49*  --  3.00*  --   --  2.97*  TROPONINIHS 6 9  --   --   --   --   --    < > = values in this interval not displayed.    Estimated Creatinine Clearance: 22.3 mL/min (A) (by C-G formula based on SCr of 2.97 mg/dL (H)).   Medical History: Past Medical History:  Diagnosis Date  . Diverticulitis   . Hypertension   . Primary high grade serous adenocarcinoma of ovary [facility])     Assessment: Pt is 65 yo female presenting to ED with c/o SOB.  Med hx HTN, HDL, morbid obesity, recent metastatic carcinoma.  Recent admission 4/26-5/2 for tx of sepsis due to UTI and bilateral hydronephrosis & obstructive uropathy status post stent. NM perfusion lung scan shows suspicious findings for acute PE, currently on a heparin infusion and warfarin is being started. She has no h/o previous warfarin exposure (baseline INR 1.2).  5/9: transitioning from heparin to enoxaparin for warfarin bridge.  Goal of Therapy:  Heparin level 0.3-0.7 units/ml INR 2 - 3 Monitor platelets by anticoagulation protocol: Yes   Plan:   Enoxaparin:   Will stop heparin infusion and initiate enoxaparin IV 120  mg 2 times a day(~1mg /kg dosing)  CBC daily per protocol  Warfarin:  5/7: INR 1.2 5/9: INR 1.1   5/9: INR 1.1. Will repeat Warfarin 7.5mg  x1 dose this evening  INR in am  Continue enoxaparin until there are 2 consecutive therapeutic INR levels  Pearla Dubonnet, PharmD Clinical Pharmacist 10/28/2020 9:01 AM

## 2020-10-29 ENCOUNTER — Other Ambulatory Visit: Payer: BC Managed Care – PPO

## 2020-10-29 ENCOUNTER — Telehealth (INDEPENDENT_AMBULATORY_CARE_PROVIDER_SITE_OTHER): Payer: Self-pay

## 2020-10-29 ENCOUNTER — Other Ambulatory Visit: Payer: Self-pay | Admitting: *Deleted

## 2020-10-29 ENCOUNTER — Ambulatory Visit: Payer: BC Managed Care – PPO

## 2020-10-29 ENCOUNTER — Inpatient Hospital Stay: Payer: BC Managed Care – PPO | Admitting: Hospice and Palliative Medicine

## 2020-10-29 ENCOUNTER — Inpatient Hospital Stay (HOSPITAL_BASED_OUTPATIENT_CLINIC_OR_DEPARTMENT_OTHER): Payer: BC Managed Care – PPO | Admitting: Oncology

## 2020-10-29 VITALS — BP 154/72 | HR 125 | Temp 98.0°F | Resp 18

## 2020-10-29 DIAGNOSIS — C786 Secondary malignant neoplasm of retroperitoneum and peritoneum: Secondary | ICD-10-CM | POA: Diagnosis not present

## 2020-10-29 DIAGNOSIS — I82461 Acute embolism and thrombosis of right calf muscular vein: Secondary | ICD-10-CM

## 2020-10-29 DIAGNOSIS — Z7901 Long term (current) use of anticoagulants: Secondary | ICD-10-CM | POA: Diagnosis not present

## 2020-10-29 DIAGNOSIS — C541 Malignant neoplasm of endometrium: Secondary | ICD-10-CM | POA: Diagnosis not present

## 2020-10-29 DIAGNOSIS — C55 Malignant neoplasm of uterus, part unspecified: Secondary | ICD-10-CM

## 2020-10-29 DIAGNOSIS — Z7189 Other specified counseling: Secondary | ICD-10-CM | POA: Diagnosis not present

## 2020-10-29 DIAGNOSIS — N179 Acute kidney failure, unspecified: Secondary | ICD-10-CM

## 2020-10-29 DIAGNOSIS — I824Z1 Acute embolism and thrombosis of unspecified deep veins of right distal lower extremity: Secondary | ICD-10-CM | POA: Diagnosis not present

## 2020-10-29 DIAGNOSIS — C579 Malignant neoplasm of female genital organ, unspecified: Secondary | ICD-10-CM

## 2020-10-29 NOTE — Telephone Encounter (Signed)
I called pt after speaking to Dr. Janese Banks. Dr. Janese Banks  Wants pt to stop coumadin as of today,. On wed. Just take lovenox  Am and Pm. Wednesday night when she goes to bed- do not eat or drink anything until after the port insertion is done. Thursday am do not take Lovenox  Or coumadin Procedure 5/[facility] and must have driver to take her back home. After the procedure pt will go back to coumadin and lovenox around 6 pm to 9 pm. Pt agreeable to the plan. She wants to know about b/p med and I will reach out to the vascular to see and let pt know. She and her husband was on speaker phone and understood directions.

## 2020-10-29 NOTE — Telephone Encounter (Signed)
I received a request from [facility] at the Cancer center to schedule a port placement for the patient. Patient is scheduled with Dr. Lucky Cowboy for a port insertion on 10/31/20 with a 10:45 am arrival time to the MM. Pre-procedure instructions were given to [place] center as she was calling the patient.

## 2020-10-29 NOTE — Progress Notes (Signed)
Pt here after getting out of hospital. She feels better. Tired. Bruise on her right arm from IV and getting blood draws. Pt on coumadin and lovenox. . Pt sob on speaking. She said that her getting here and out of car- it wears her out. She feels better since leaving hospital

## 2020-10-30 ENCOUNTER — Encounter: Payer: BC Managed Care – PPO | Admitting: Urology

## 2020-10-30 ENCOUNTER — Other Ambulatory Visit: Payer: BC Managed Care – PPO

## 2020-10-30 ENCOUNTER — Encounter: Payer: Self-pay | Admitting: Oncology

## 2020-10-30 ENCOUNTER — Telehealth: Payer: Self-pay | Admitting: *Deleted

## 2020-10-30 DIAGNOSIS — C55 Malignant neoplasm of uterus, part unspecified: Secondary | ICD-10-CM | POA: Insufficient documentation

## 2020-10-30 MED ORDER — DEXAMETHASONE 4 MG PO TABS: ORAL_TABLET | ORAL | 1 refills | Status: AC

## 2020-10-30 MED ORDER — ONDANSETRON HCL 8 MG PO TABS
8.0000 mg | ORAL_TABLET | Freq: Two times a day (BID) | ORAL | 1 refills | Status: AC | PRN
Start: 2020-10-30 — End: ?

## 2020-10-30 MED ORDER — PROCHLORPERAZINE MALEATE 10 MG PO TABS
10.0000 mg | ORAL_TABLET | Freq: Four times a day (QID) | ORAL | 1 refills | Status: AC | PRN
Start: 2020-10-30 — End: ?

## 2020-10-30 MED ORDER — LIDOCAINE-PRILOCAINE 2.5-2.5 % EX CREA
TOPICAL_CREAM | CUTANEOUS | 3 refills | Status: AC
Start: 2020-10-30 — End: ?

## 2020-10-30 NOTE — Progress Notes (Deleted)
10/30/2020 1:11 PM   Tracy Goodman 1955/10/26 [id number]  Referring provider: [name], MD [place],  [place] [address]  No chief complaint on file.   HPI:    PMH: Past Medical History:  Diagnosis Date  . Diverticulitis   . Hypertension   . Primary high grade serous adenocarcinoma of ovary [facility])     Surgical History: Past Surgical History:  Procedure Laterality Date  . CESAREAN SECTION    . CYSTOSCOPY W/ URETERAL STENT PLACEMENT Bilateral 10/10/2020   Procedure: CYSTOSCOPY WITH RETROGRADE PYELOGRAM/URETERAL STENT PLACEMENT;  Surgeon: Hollice Espy, MD;  Location: [facility] ORS;  Service: Urology;  Laterality: Bilateral;  . FULGURATION OF BLADDER TUMOR N/A 10/10/2020   Procedure: FULGURATION OF BLADDER TUMOR;  Surgeon: Hollice Espy, MD;  Location: [facility] ORS;  Service: Urology;  Laterality: N/A;  . WRIST FRACTURE SURGERY Left     Home Medications:  Allergies as of 10/30/2020   No Known Allergies     Medication List       Accurate as of Oct 30, 2020  1:11 PM. If you have any questions, ask your nurse or doctor.        acetaminophen 325 MG tablet Commonly known as: TYLENOL Take 2 tablets (650 mg total) by mouth every 6 (six) hours as needed for mild pain, fever or headache (or Fever >/= 101).   amLODipine 10 MG tablet Commonly known as: NORVASC Take 1 tablet (10 mg total) by mouth daily.   dexamethasone 4 MG tablet Commonly known as: DECADRON Take 2 tablets by mouth once a day starting the day after chemotherapy. Continue for 3 days total. Take with food.   enoxaparin 120 MG/0.8ML injection Commonly known as: LOVENOX Inject 0.8 mLs (120 mg total) into the skin 2 (two) times daily for 7 days. Continue Lovenox injections until the INR is between 2 -3   lidocaine-prilocaine cream Commonly known as: EMLA Apply to affected area once   ondansetron 8 MG tablet Commonly known as: Zofran Take 1 tablet (8 mg total) by mouth 2 (two) times  daily as needed. Start on the third day after chemotherapy.   prochlorperazine 10 MG tablet Commonly known as: COMPAZINE Take 1 tablet (10 mg total) by mouth every 6 (six) hours as needed (Nausea or vomiting).   saccharomyces boulardii 250 MG capsule Commonly known as: FLORASTOR Take 250 mg by mouth 2 (two) times daily.   warfarin 5 MG tablet Commonly known as: Coumadin Please take 1-1/2 tablet (7.5 mg) for 3 days, then continue 5 mg daily.  Coumadin dose will be adjusted by your doctor at the appointment.       Allergies: No Known Allergies  Family History: Family History  Problem Relation Age of Onset  . COPD Mother   . Heart failure Mother     Social History:  reports that she has never smoked. She has never used smokeless tobacco. She reports previous alcohol use. She reports that she does not use drugs.   Physical Exam: There were no vitals taken for this visit.  Constitutional:  Alert and oriented, No acute distress. HEENT: [place] AT, moist mucus membranes.  Trachea midline, no masses. Cardiovascular: No clubbing, cyanosis, or edema. Respiratory: Normal respiratory effort, no increased work of breathing. GI: Abdomen is soft, nontender, nondistended, no abdominal masses GU: No CVA tenderness Lymph: No cervical or inguinal lymphadenopathy. Skin: No rashes, bruises or suspicious lesions. Neurologic: Grossly intact, no focal deficits, moving all 4 extremities. Psychiatric: Normal  mood and affect.  Laboratory Data: Lab Results  Component Value Date   WBC 8.7 10/28/2020   HGB 9.8 (L) 10/28/2020   HCT 32.2 (L) 10/28/2020   MCV 85.0 10/28/2020   PLT 354 10/28/2020    Lab Results  Component Value Date   CREATININE 2.97 (H) 10/28/2020    No results found for: PSA  No results found for: TESTOSTERONE  No results found for: HGBA1C  Urinalysis    Component Value Date/Time   COLORURINE STRAW (A) 10/26/2020 1222   APPEARANCEUR HAZY (A) 10/26/2020 1222   LABSPEC  1.004 (L) 10/26/2020 1222   PHURINE 7.0 10/26/2020 1222   GLUCOSEU NEGATIVE 10/26/2020 1222   HGBUR MODERATE (A) 10/26/2020 1222   BILIRUBINUR NEGATIVE 10/26/2020 1222   KETONESUR NEGATIVE 10/26/2020 1222   PROTEINUR NEGATIVE 10/26/2020 1222   NITRITE NEGATIVE 10/26/2020 1222   LEUKOCYTESUR MODERATE (A) 10/26/2020 1222    Lab Results  Component Value Date   BACTERIA NONE SEEN 10/26/2020    Pertinent Imaging: *** No results found for this or any previous visit.  Results for orders placed during the hospital encounter of 10/26/20  US Venous Img Lower Bilateral  Narrative CLINICAL DATA:  65 year old female with bilateral lower extremity edema.  EXAM: BILATERAL LOWER EXTREMITY VENOUS DOPPLER ULTRASOUND  TECHNIQUE: Gray-scale sonography with graded compression, as well as color Doppler and duplex ultrasound were performed to evaluate the lower extremity deep venous systems from the level of the common femoral vein and including the common femoral, femoral, profunda femoral, popliteal and calf veins including the posterior tibial, peroneal and gastrocnemius veins when visible. The superficial great saphenous vein was also interrogated. Spectral Doppler was utilized to evaluate flow at rest and with distal augmentation maneuvers in the common femoral, femoral and popliteal veins.  COMPARISON:  None.  FINDINGS: RIGHT LOWER EXTREMITY  Common Femoral Vein: No evidence of thrombus. Normal compressibility, respiratory phasicity and response to augmentation.  Saphenofemoral Junction: No evidence of thrombus. Normal compressibility and flow on color Doppler imaging.  Profunda Femoral Vein: No evidence of thrombus. Normal compressibility and flow on color Doppler imaging.  Femoral Vein: No evidence of thrombus. Normal compressibility, respiratory phasicity and response to augmentation.  Popliteal Vein: No evidence of thrombus. Normal compressibility, respiratory phasicity  and response to augmentation.  Calf Veins: There is thrombus in the posterior tibial and peroneal veins with noncompressibility of the vessels. Five  Superficial Great Saphenous Vein: No evidence of thrombus. Normal compressibility.  Venous Reflux:  None.  Other Findings:  None.  LEFT LOWER EXTREMITY  Common Femoral Vein: No evidence of thrombus. Normal compressibility, respiratory phasicity and response to augmentation.  Saphenofemoral Junction: No evidence of thrombus. Normal compressibility and flow on color Doppler imaging.  Profunda Femoral Vein: No evidence of thrombus. Normal compressibility and flow on color Doppler imaging.  Femoral Vein: No evidence of thrombus. Normal compressibility, respiratory phasicity and response to augmentation.  Popliteal Vein: No evidence of thrombus. Normal compressibility, respiratory phasicity and response to augmentation.  Calf Veins: No evidence of thrombus. Normal compressibility and flow on color Doppler imaging.  Superficial Great Saphenous Vein: No evidence of thrombus. Normal compressibility.  Venous Reflux:  None.  Other Findings:  None.  IMPRESSION: 1. Occlusive thrombus in the right calf involving the posterior tibial and peroneal veins. 2. No DVT in the left lower extremity.  These results were called by telephone at the time of interpretation on 10/26/2020 at 2:49 am to provider [facility] , who verbally acknowledged these  results.   Electronically Signed By: Anner Crete M.D. On: 10/26/2020 02:51  No results found for this or any previous visit.  No results found for this or any previous visit.  Results for orders placed during the hospital encounter of 10/09/20  US RENAL  Narrative CLINICAL DATA:  Rising creatinine.  Advanced gyn malignancy  EXAM: RENAL / URINARY TRACT ULTRASOUND COMPLETE  COMPARISON:  CT abdomen pelvis 09/30/2020  FINDINGS: Right Kidney:  Renal measurements: 10.8 x 5.2  x 5.6 cm = volume: 162 mL. Mild right hydronephrosis unchanged from prior CT. No renal mass.  Left Kidney:  Renal measurements: 11.1 x 5.2 x 5.2 cm = volume: 156 mL. Mild left hydronephrosis unchanged from recent CT. No left renal mass.  Bladder:  Urinary jets not seen during 10 minutes of observation.  Other:  Trace ascites  IMPRESSION: Mild bilateral renal obstruction likely due to distal ureteral obstruction. No change from recent CT.   Electronically Signed By: Franchot Gallo M.D. On: 10/09/2020 16:17  No results found for this or any previous visit.  No results found for this or any previous visit.  Results for orders placed during the hospital encounter of 10/15/20  CT Renal Stone Study  Narrative CLINICAL DATA:  Renal insufficiency.  EXAM: CT ABDOMEN AND PELVIS WITHOUT CONTRAST  TECHNIQUE: Multidetector CT imaging of the abdomen and pelvis was performed following the standard protocol without IV contrast.  COMPARISON:  09/30/2020.  FINDINGS: Lower chest: Basilar atelectasis again noted bilaterally with interval increase in posterior left lower lobe collapse/consolidative opacity. Small left pleural effusion has progressed in the interval.  Hepatobiliary: No focal abnormality in the liver on this study without intravenous contrast. Layering sludge noted in the distended gallbladder. No intrahepatic or extrahepatic biliary dilation.  Pancreas: No focal mass lesion. No dilatation of the main duct. No intraparenchymal cyst. No peripancreatic edema.  Spleen: No splenomegaly. No focal mass lesion.  Adrenals/Urinary Tract: No adrenal nodule or mass. Mild hydronephrosis in both kidneys is similar to prior with bilateral internal ureteral stents visualized. No associated hydroureter. The bladder is decompressed.  Stomach/Bowel: Tiny hiatal hernia. Stomach otherwise unremarkable. Duodenum is normally positioned as is the ligament of Treitz. No small  bowel wall thickening. No small bowel dilatation. The terminal ileum is normal. The appendix is not well visualized, but there is no edema or inflammation in the region of the cecum. No gross colonic mass. No colonic wall thickening. Diverticular changes are noted in the left colon without evidence of diverticulitis.  Vascular/Lymphatic: No abdominal aortic aneurysm. No change in the retroperitoneal abdominopelvic lymphadenopathy, better characterized on the recent CT scan performed with intravenous contrast material.  Reproductive: The uterus is unremarkable. Similar prominence of the cervix. There is no adnexal mass.  Other: Interval increase in small to moderate volume abdominopelvic ascites with edema and nodularity in the omentum inferiorly, as before.  Musculoskeletal: Small bilateral groin hernias contain only fat. No worrisome lytic or sclerotic osseous abnormality.  IMPRESSION: 1. Mild bilateral hydronephrosis is similar to prior, now with bilateral internal ureteral stents in situ. No evidence for hydroureter in the bladder is decompressed. 2. Similar appearance of upper normal to mildly enlarged retroperitoneal and pelvic sidewall lymphadenopathy. This was better evaluated on recent CT with IV contrast. 3. Interval progression of ascites, now small to moderate volume. There is associated edema/caking in the omentum concerning for metastatic disease. 4. Small bilateral groin hernias contain only fat. 5. Left colonic diverticulosis without diverticulitis.   Electronically Signed By:  Misty Stanley M.D. On: 10/15/2020 13:06   Assessment & Plan:    There are no diagnoses linked to this encounter.  No follow-ups on file.  Hollice Espy, MD  [facility] [address] [place], [place] [address] [phone]

## 2020-10-30 NOTE — Progress Notes (Signed)
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  She was seen by GYN Dr. Kenton Kingfisher after she underwent a CT abdomen for symptoms of lower abdominal pain.  CT scan showed small left pleural effusion.  Multiple prominent borderline enlarged retroperitoneal and pelvic lymph nodes.  These ranged from 1 to 1.3 cm.  Masslike enlargement of the cervix measuring 3.8 x 3 x 2.9 cm.  Extensive omental nodularity most notable in the low anatomy pelvis.  Patient underwent cervical biopsy in GYN office but biopsy results did not show any evidence of malignancy  Patient subsequently admitted to the hospital for AKI and underwent bilateral ureteral stenting as well as biopsies from the bladder which showed high-grade serous carcinoma.  Creatinine went back down to 2.2 but never returned back to baseline.  Following that creatinine went up to 7 and she was rehospitalized requiring temporary Foley placement.  She has not required nephrostomy tubes so far and present creatinine between 2.5-3.5.  Her hospitalization for new right lower extremity DVT for which she is currently on Lovenox and Coumadin  Interval history-patient states that she is doing better since her hospitalization.  She still reports ongoing fatigue and exertional shortness of breath.  She is here with her daughter today  ECOG PS-  1-2 Pain scale- 2   Review of systems- Review of Systems  Constitutional: Positive for malaise/fatigue. Negative for chills, fever and weight loss.  HENT: Negative for congestion, ear discharge and nosebleeds.   Eyes: Negative for blurred vision.  Respiratory: Positive for shortness of breath. Negative for cough, hemoptysis, sputum production and wheezing.   Cardiovascular: Negative for chest pain, palpitations, orthopnea and claudication.  Gastrointestinal: Negative for abdominal pain, blood in stool, constipation, diarrhea, heartburn, melena, nausea and vomiting.  Genitourinary: Negative for dysuria, flank pain, frequency, hematuria and urgency.  Musculoskeletal: Negative for back pain, joint pain and myalgias.  Skin: Negative for rash.  Neurological: Negative for dizziness, tingling, focal weakness, seizures, weakness and headaches.  Endo/Heme/Allergies: Does not bruise/bleed easily.  Psychiatric/Behavioral: Negative for depression and suicidal ideas. The patient does not have insomnia.       No Known Allergies   Past Medical History:  Diagnosis Date  . Diverticulitis   . Hypertension   . Primary high grade serous adenocarcinoma of ovary [facility])      Past Surgical History:  Procedure Laterality Date  . CESAREAN SECTION    . CYSTOSCOPY W/ URETERAL STENT PLACEMENT Bilateral 10/10/2020   Procedure: CYSTOSCOPY WITH RETROGRADE PYELOGRAM/URETERAL STENT PLACEMENT;  Surgeon: Hollice Espy, MD;  Location: [facility] ORS;  Service: Urology;  Laterality: Bilateral;  . FULGURATION OF BLADDER TUMOR N/A 10/10/2020   Procedure: FULGURATION OF BLADDER TUMOR;  Surgeon: Hollice Espy, MD;  Location: [facility] ORS;  Service: Urology;  Laterality: N/A;  . WRIST FRACTURE SURGERY Left     Social History   Socioeconomic History  . Marital status: Married    Spouse name: Tempie Donning  . Number of children: 3  .  Years of education: high school  . Highest education level: Not on file  Occupational History   . Not on file  Tobacco Use  . Smoking status: Never Smoker  . Smokeless tobacco: Never Used  Substance and Sexual Activity  . Alcohol use: Not Currently  . Drug use: Never  . Sexual activity: Yes    Birth control/protection: Post-menopausal  Other Topics Concern  . Not on file  Social History Narrative   03/12/20   From: the area   Living: with Tempie Donning, husband (2012) and one step son   Work: school bus driver      Family: 3 adult children - [place], [place], [place] Drummer - 6-7 grandchildren      Enjoys: singing in the choir at church      Exercise: weight lifting in the morning, stretches   Diet: varied, tries to eat healthy - baked salmon and fish/chicken/fried food      Safety   Seat belts: Yes    Guns: No   Safe in relationships: Yes    Social Determinants of Radio broadcast assistant Strain: Not on file  Food Insecurity: Not on file  Transportation Needs: Not on file  Physical Activity: Not on file  Stress: Not on file  Social Connections: Not on file  Intimate Partner Violence: Not on file    Family History  Problem Relation Age of Onset  . COPD Mother   . Heart failure Mother      Current Outpatient Medications:  .  acetaminophen (TYLENOL) 325 MG tablet, Take 2 tablets (650 mg total) by mouth every 6 (six) hours as needed for mild pain, fever or headache (or Fever >/= 101)., Disp: , Rfl:  .  amLODipine (NORVASC) 10 MG tablet, Take 1 tablet (10 mg total) by mouth daily., Disp: 30 tablet, Rfl: 0 .  enoxaparin (LOVENOX) 120 MG/0.8ML injection, Inject 0.8 mLs (120 mg total) into the skin 2 (two) times daily for 7 days. Continue Lovenox injections until the INR is between 2 -3, Disp: 11.2 mL, Rfl: 0 .  saccharomyces boulardii (FLORASTOR) 250 MG capsule, Take 250 mg by mouth 2 (two) times daily., Disp: , Rfl:  .  warfarin (COUMADIN) 5 MG tablet, Please take 1-1/2 tablet (7.5 mg) for 3 days, then continue 5 mg daily.  Coumadin dose will be adjusted by your doctor at  the appointment., Disp: 30 tablet, Rfl: 11 .  dexamethasone (DECADRON) 4 MG tablet, Take 2 tablets by mouth once a day starting the day after chemotherapy. Continue for 3 days total. Take with food., Disp: 30 tablet, Rfl: 1 .  lidocaine-prilocaine (EMLA) cream, Apply to affected area once, Disp: 30 g, Rfl: 3 .  ondansetron (ZOFRAN) 8 MG tablet, Take 1 tablet (8 mg total) by mouth 2 (two) times daily as needed. Start on the third day after chemotherapy., Disp: 30 tablet, Rfl: 1 .  prochlorperazine (COMPAZINE) 10 MG tablet, Take 1 tablet (10 mg total) by mouth every 6 (six) hours as needed (Nausea or vomiting)., Disp: 30 tablet, Rfl: 1  Physical exam:  Vitals:   10/29/20 0934  BP: (!) 154/72  Pulse: (!) 125  Resp: 18  Temp: 98 F (36.7 C)  TempSrc: Oral   Physical Exam Constitutional:      General: She is not in acute distress.    Appearance: She is obese.     Comments: Sitting in a wheelchair.  Appears in no acute distress  Cardiovascular:     Rate and Rhythm:  Normal rate and regular rhythm.     Heart sounds: Normal heart sounds.  Pulmonary:     Effort: Pulmonary effort is normal.     Breath sounds: Normal breath sounds.  Abdominal:     General: Bowel sounds are normal.     Palpations: Abdomen is soft.  Musculoskeletal:     Comments: Bilateral lower extremity +1 edema  Skin:    General: Skin is warm and dry.  Neurological:     Mental Status: She is alert and oriented to person, place, and time.      CMP Latest Ref Rng & Units 10/28/2020  Glucose 70 - 99 mg/dL 109(H)  BUN 8 - 23 mg/dL 19  Creatinine 0.44 - 1.00 mg/dL 2.97(H)  Sodium 135 - 145 mmol/L 140  Potassium 3.5 - 5.1 mmol/L 3.6  Chloride 98 - 111 mmol/L 110  CO2 22 - 32 mmol/L 20(L)  Calcium 8.9 - 10.3 mg/dL 8.4(L)  Total Protein 6.5 - 8.1 g/dL -  Total Bilirubin 0.3 - 1.2 mg/dL -  Alkaline Phos 38 - 126 U/L -  AST 15 - 41 U/L -  ALT 0 - 44 U/L -   CBC Latest Ref Rng & Units 10/28/2020  WBC 4.0 - 10.5 K/uL 8.7   Hemoglobin 12.0 - 15.0 g/dL 9.8(L)  Hematocrit 36.0 - 46.0 % 32.2(L)  Platelets 150 - 400 K/uL 354       NM Pulmonary Perfusion  Addendum Date: 10/26/2020   ADDENDUM REPORT: 10/26/2020 09:28 ADDENDUM: Study discussed by telephone with Ripudeep Rai MD on 10/26/2020 at 0923 hours. The patient is being actively anticoagulated for DVT. We also discussed that I estimate the volume of left pleural effusion is small based on the recent comparisons. Electronically Signed   By: Genevie Ann M.D.   On: 10/26/2020 09:28   Result Date: 10/26/2020 CLINICAL DATA:  65 year old female with shortness of breath. Positive right calf DVT. EXAM: NUCLEAR MEDICINE PERFUSION LUNG SCAN TECHNIQUE: Perfusion images were obtained in multiple projections after intravenous injection of radiopharmaceutical. Ventilation scans intentionally deferred if perfusion scan and chest x-ray adequate for interpretation during COVID 19 epidemic. RADIOPHARMACEUTICALS:  4.4 mCi Tc-33mMAA IV COMPARISON:  Portable chest 0032 hours today. CT Abdomen and Pelvis 02022-05-06 FINDINGS: Pronounced asymmetry, decreased perfusion activity throughout much of the left lung. Left pleural effusion on recent comparison Scala although fairly small. Additional more discrete perfusion defects in the left upper lung on the anterior and oblique images. Fairly homogeneous activity in the right lung. IMPRESSION: Asymmetric decreased activity in the left lung, both global and focal, which seems out of proportion to that expected from the left pleural effusion seen on recent comparisons. Therefore this exam is suspicious for Acute PE. Electronically Signed: By: HGenevie AnnM.D. On: 10/26/2020 09:18   [place]NAL  Result Date: 10/09/2020 CLINICAL DATA:  Rising creatinine.  Advanced gyn malignancy EXAM: RENAL / URINARY TRACT ULTRASOUND COMPLETE COMPARISON:  CT abdomen pelvis 09/30/2020 FINDINGS: Right Kidney: Renal measurements: 10.8 x 5.2 x 5.6 cm = volume: 162 mL. Mild right  hydronephrosis unchanged from prior CT. No renal mass. Left Kidney: Renal measurements: 11.1 x 5.2 x 5.2 cm = volume: 156 mL. Mild left hydronephrosis unchanged from recent CT. No left renal mass. Bladder: Urinary jets not seen during 10 minutes of observation. Other: Trace ascites IMPRESSION: Mild bilateral renal obstruction likely due to distal ureteral obstruction. No change from recent CT. Electronically Signed   By: CFranchot GalloM.D.   On: 10/09/2020  16:17   US Venous Img Lower Bilateral  Result Date: 10/26/2020 CLINICAL DATA:  65 year old female with bilateral lower extremity edema. EXAM: BILATERAL LOWER EXTREMITY VENOUS DOPPLER ULTRASOUND TECHNIQUE: Gray-scale sonography with graded compression, as well as color Doppler and duplex ultrasound were performed to evaluate the lower extremity deep venous systems from the level of the common femoral vein and including the common femoral, femoral, profunda femoral, popliteal and calf veins including the posterior tibial, peroneal and gastrocnemius veins when visible. The superficial great saphenous vein was also interrogated. Spectral Doppler was utilized to evaluate flow at rest and with distal augmentation maneuvers in the common femoral, femoral and popliteal veins. COMPARISON:  None. FINDINGS: RIGHT LOWER EXTREMITY Common Femoral Vein: No evidence of thrombus. Normal compressibility, respiratory phasicity and response to augmentation. Saphenofemoral Junction: No evidence of thrombus. Normal compressibility and flow on color Doppler imaging. Profunda Femoral Vein: No evidence of thrombus. Normal compressibility and flow on color Doppler imaging. Femoral Vein: No evidence of thrombus. Normal compressibility, respiratory phasicity and response to augmentation. Popliteal Vein: No evidence of thrombus. Normal compressibility, respiratory phasicity and response to augmentation. Calf Veins: There is thrombus in the posterior tibial and peroneal veins with  noncompressibility of the vessels. Five Superficial Great Saphenous Vein: No evidence of thrombus. Normal compressibility. Venous Reflux:  None. Other Findings:  None. LEFT LOWER EXTREMITY Common Femoral Vein: No evidence of thrombus. Normal compressibility, respiratory phasicity and response to augmentation. Saphenofemoral Junction: No evidence of thrombus. Normal compressibility and flow on color Doppler imaging. Profunda Femoral Vein: No evidence of thrombus. Normal compressibility and flow on color Doppler imaging. Femoral Vein: No evidence of thrombus. Normal compressibility, respiratory phasicity and response to augmentation. Popliteal Vein: No evidence of thrombus. Normal compressibility, respiratory phasicity and response to augmentation. Calf Veins: No evidence of thrombus. Normal compressibility and flow on color Doppler imaging. Superficial Great Saphenous Vein: No evidence of thrombus. Normal compressibility. Venous Reflux:  None. Other Findings:  None. IMPRESSION: 1. Occlusive thrombus in the right calf involving the posterior tibial and peroneal veins. 2. No DVT in the left lower extremity. These results were called by telephone at the time of interpretation on 10/26/2020 at 2:49 am to provider [facility] , who verbally acknowledged these results. Electronically Signed   By: Tracy Crete M.D.   On: 10/26/2020 02:51   DG Chest Portable 1 View  Result Date: 10/26/2020 CLINICAL DATA:  Shortness of breath EXAM: PORTABLE CHEST 1 VIEW COMPARISON:  10/19/2020 FINDINGS: Mild increased opacity at the left lung base. No pulmonary edema. No pleural effusion or pneumothorax. Normal cardiomediastinal contours. IMPRESSION: Mild increased opacity at the left lung base may indicate atelectasis. Electronically Signed   By: Ulyses Jarred M.D.   On: 10/26/2020 00:56   DG Chest Port 1 View  Result Date: 10/19/2020 CLINICAL DATA:  66 year old female with shortness of breath. EXAM: PORTABLE CHEST 1 VIEW  COMPARISON:  Portable chest 10/18/2020 and earlier. FINDINGS: Portable AP semi upright view at 0713 hours. Continued lower lung volumes than baseline compared to last month. Mediastinal contours remain normal. Visualized tracheal air column is within normal limits. No pneumothorax. No pleural effusion. Mild crowding of lung markings including along the right minor fissure is stable since 10/15/2020. Paucity of bowel gas in the upper abdomen. No acute osseous abnormality identified. IMPRESSION: Stable low lung volumes and suspected mild atelectasis. No new cardiopulmonary abnormality. Electronically Signed   By: Genevie Ann M.D.   On: 10/19/2020 08:00   DG Chest Port 1  View  Result Date: 10/18/2020 CLINICAL DATA:  Fever. EXAM: PORTABLE CHEST 1 VIEW COMPARISON:  October 15, 2020. FINDINGS: The heart size and mediastinal contours are within normal limits. Right lung is clear. Minimal left basilar subsegmental atelectasis is noted and decreased compared to prior exam. The visualized skeletal structures are unremarkable. IMPRESSION: Improved left basilar subsegmental atelectasis. Electronically Signed   By: Marijo Conception M.D.   On: 10/18/2020 08:51   DG Chest Port 1 View  Result Date: 10/15/2020 CLINICAL DATA:  AK I EXAM: PORTABLE CHEST 1 VIEW COMPARISON:  Same day CT abdomen pelvis and chest radiograph September 04, 2020. FINDINGS: The heart size and mediastinal contours are within normal limits. Low lung volumes with bibasilar linear atelectasis. Small left pleural effusion with adjacent consolidative opacity. No visible pneumothorax. The visualized skeletal structures are unchanged. IMPRESSION: Small left pleural effusion with adjacent left lower lobe consolidative opacity, which may reflect atelectasis and/or infection. Electronically Signed   By: Dahlia Bailiff MD   On: 10/15/2020 16:03   DG OR UROLOGY CYSTO IMAGE ([place])  Result Date: 10/10/2020 There is no interpretation for this exam.  This order is for  images obtained during a surgical procedure.  Please See "Surgeries" Tab for more information regarding the procedure.   US BREAST LTD UNI RIGHT INC AXILLA  Result Date: 10/01/2020 CLINICAL DATA:  65 year old female presenting with a new palpable area of concern in the right breast. EXAM: DIGITAL DIAGNOSTIC UNILATERAL RIGHT MAMMOGRAM WITH TOMOSYNTHESIS AND CAD; ULTRASOUND RIGHT BREAST LIMITED TECHNIQUE: Right digital diagnostic mammography and breast tomosynthesis was performed. The images were evaluated with computer-aided detection.; Targeted ultrasound examination of the right breast was performed COMPARISON:  Previous exam(s). ACR Breast Density Category b: There are scattered areas of fibroglandular density. FINDINGS: Mammogram: A skin BB marks the site of concern reported by the patient in the upper outer right breast. A spot tangential view of this area is performed in addition to standard views. There is a round circumscribed mass with central fatty hilum measuring approximately 1.2 cm at the palpable site, most likely an intramammary lymph node. There are no additional new findings elsewhere in the right breast. On physical exam at site of concern reported by the patient I feel a discrete mass. Ultrasound: Targeted ultrasound is performed at the palpable site of concern in the right breast at 9:30 o'clock 9 cm from the nipple demonstrating a round circumscribed hypoechoic mass with central fatty hilum measuring 1.3 x 1.3 x 1.1 cm, consistent with a intramammary lymph node. There is cortical thickening measuring 0.5 cm. This corresponds to the mammographic finding. Targeted ultrasound of the right axilla demonstrates normal lymph nodes. IMPRESSION: Abnormal intramammary lymph node in the right breast at 9:30 o'clock at the palpable site reported by the patient. RECOMMENDATION: Ultrasound-guided core needle biopsy of the right breast mass at 9:30 o'clock. I have discussed the findings and recommendations  with the patient who agrees to proceed with biopsy. The patient will be contacted by our scheduler to arrange the biopsy appointment. BI-RADS CATEGORY  4: Suspicious. Electronically Signed   By: Audie Pinto M.D.   On: 10/01/2020 11:23   MM DIAG BREAST TOMO UNI RIGHT  Result Date: 10/01/2020 CLINICAL DATA:  65 year old female presenting with a new palpable area of concern in the right breast. EXAM: DIGITAL DIAGNOSTIC UNILATERAL RIGHT MAMMOGRAM WITH TOMOSYNTHESIS AND CAD; ULTRASOUND RIGHT BREAST LIMITED TECHNIQUE: Right digital diagnostic mammography and breast tomosynthesis was performed. The images were evaluated with computer-aided detection.;  Targeted ultrasound examination of the right breast was performed COMPARISON:  Previous exam(s). ACR Breast Density Category b: There are scattered areas of fibroglandular density. FINDINGS: Mammogram: A skin BB marks the site of concern reported by the patient in the upper outer right breast. A spot tangential view of this area is performed in addition to standard views. There is a round circumscribed mass with central fatty hilum measuring approximately 1.2 cm at the palpable site, most likely an intramammary lymph node. There are no additional new findings elsewhere in the right breast. On physical exam at site of concern reported by the patient I feel a discrete mass. Ultrasound: Targeted ultrasound is performed at the palpable site of concern in the right breast at 9:30 o'clock 9 cm from the nipple demonstrating a round circumscribed hypoechoic mass with central fatty hilum measuring 1.3 x 1.3 x 1.1 cm, consistent with a intramammary lymph node. There is cortical thickening measuring 0.5 cm. This corresponds to the mammographic finding. Targeted ultrasound of the right axilla demonstrates normal lymph nodes. IMPRESSION: Abnormal intramammary lymph node in the right breast at 9:30 o'clock at the palpable site reported by the patient. RECOMMENDATION:  Ultrasound-guided core needle biopsy of the right breast mass at 9:30 o'clock. I have discussed the findings and recommendations with the patient who agrees to proceed with biopsy. The patient will be contacted by our scheduler to arrange the biopsy appointment. BI-RADS CATEGORY  4: Suspicious. Electronically Signed   By: Audie Pinto M.D.   On: 10/01/2020 11:23   CT Renal Stone Study  Result Date: 10/15/2020 CLINICAL DATA:  Renal insufficiency. EXAM: CT ABDOMEN AND PELVIS WITHOUT CONTRAST TECHNIQUE: Multidetector CT imaging of the abdomen and pelvis was performed following the standard protocol without IV contrast. COMPARISON:  09/30/2020. FINDINGS: Lower chest: Basilar atelectasis again noted bilaterally with interval increase in posterior left lower lobe collapse/consolidative opacity. Small left pleural effusion has progressed in the interval. Hepatobiliary: No focal abnormality in the liver on this study without intravenous contrast. Layering sludge noted in the distended gallbladder. No intrahepatic or extrahepatic biliary dilation. Pancreas: No focal mass lesion. No dilatation of the main duct. No intraparenchymal cyst. No peripancreatic edema. Spleen: No splenomegaly. No focal mass lesion. Adrenals/Urinary Tract: No adrenal nodule or mass. Mild hydronephrosis in both kidneys is similar to prior with bilateral internal ureteral stents visualized. No associated hydroureter. The bladder is decompressed. Stomach/Bowel: Tiny hiatal hernia. Stomach otherwise unremarkable. Duodenum is normally positioned as is the ligament of Treitz. No small bowel wall thickening. No small bowel dilatation. The terminal ileum is normal. The appendix is not well visualized, but there is no edema or inflammation in the region of the cecum. No gross colonic mass. No colonic wall thickening. Diverticular changes are noted in the left colon without evidence of diverticulitis. Vascular/Lymphatic: No abdominal aortic aneurysm.  No change in the retroperitoneal abdominopelvic lymphadenopathy, better characterized on the recent CT scan performed with intravenous contrast material. Reproductive: The uterus is unremarkable. Similar prominence of the cervix. There is no adnexal mass. Other: Interval increase in small to moderate volume abdominopelvic ascites with edema and nodularity in the omentum inferiorly, as before. Musculoskeletal: Small bilateral groin hernias contain only fat. No worrisome lytic or sclerotic osseous abnormality. IMPRESSION: 1. Mild bilateral hydronephrosis is similar to prior, now with bilateral internal ureteral stents in situ. No evidence for hydroureter in the bladder is decompressed. 2. Similar appearance of upper normal to mildly enlarged retroperitoneal and pelvic sidewall lymphadenopathy. This was better evaluated on  recent CT with IV contrast. 3. Interval progression of ascites, now small to moderate volume. There is associated edema/caking in the omentum concerning for metastatic disease. 4. Small bilateral groin hernias contain only fat. 5. Left colonic diverticulosis without diverticulitis. Electronically Signed   By: Misty Stanley M.D.   On: 10/15/2020 13:06     Assessment and plan- Patient is a 65 y.o. female with newly diagnosed stage [id number] high-grade serous carcinoma possibly endometrial primary here to discuss further management  I discussed CT findings and pathology with the patient and her daughter.  Patient has stage IV high-grade serous carcinoma and case was also discussed on 10/30/2020 at tumor board and consensus was that this was behaving clinically more as an endometrial primary than ovarian primary.  She is not upfront resectable and I would recommend chemotherapy involving carboplatin and Taxol.  I would like to give it weekly for the first 3 cycles and see how she tolerates it along with her renal functions and then switch her to every 3-week regimen thereafter.  Discussed risks and  benefits of chemotherapy including all but not limited to nausea, vomiting, low blood counts, risk of infections and hospitalizations.  Risk of peripheral neuropathy and infusion reaction associated with Taxol.  There is also risk of renal dysfunction associated with carboplatin which we will need to monitor closely during treatment and I would bring her twice a week for BMP checking as well as possible IV fluids.  If she has an excellent response to treatment after 3 cycles surgery would be a consideration down the line.  Treatment is being given with a palliative intent.  Given that she is obese we are going ahead with adjusted body weight instead of actual body weight and her calculated creatinine clearance is about 22 mL/min  Right lower extremity DVT: Given her renal dysfunction it would be okay to keep her on Coumadin for now and if her renal functions improve I will switch her to NOAC in the future.  Patient is presently on Lovenox and Coumadin.  Plan is forPort placement on 10/31/2020 when I will therefore have her hold her Coumadin today and tomorrow.  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  Mild bilateral hydronephrosis noted on CT scan and patient is s/p bilateral ureteral stents.  She follows up with urology and plan is to hold off on nephrostomy tube at this time.  If there is progressive worsening of her renal function that may be considered down the line  Patient's tumor was MSI stable and HER2 negative.  We will also send of NGS testing  Cancer Staging Serous adenocarcinoma of uterus, stage 4 ([place]) Staging form: Corpus Uteri - Carcinoma and Carcinosarcoma, AJCC 8th Edition - Clinical stage from  10/30/2020: FIGO Stage IVB (cT4, cN2a, cM1) - Signed by Sindy Guadeloupe, MD on 10/30/2020 Histologic grade (G): G3 Histologic grading system: 3 grade system     Visit Diagnosis 1. Serous adenocarcinoma of uterus, stage 4 ([place])   2. Goals of care, counseling/discussion   3. AKI (acute kidney injury) ([place])   4. Acute deep vein thrombosis (DVT) of calf muscle vein of right lower extremity ([facility])      Dr. Astrid Divine  Janese Banks, MD, MPH [place] [facility] [phone] 10/30/2020 1:12 PM

## 2020-10-30 NOTE — Progress Notes (Signed)
Tumor Board Documentation  Tracy Goodman was presented by Dr. Janese Goodman at our Tumor Board on 10/30/2020, which included representatives from medical oncology,radiation oncology,surgical oncology,navigation,genetics,palliative care.  Tracy Goodman currently presents as a new patient,for MDC,for new tumor(s) with history of the following treatments: none.  Additionally, we reviewed previous medical and familial history, history of present illness, and recent lab results along with all available histopathologic and imaging studies. The tumor board considered available treatment options and made the following recommendations:   weekly carbo-taxol d/t renal function vs q3w dosing & NGS. Repeat imaging after 3 cycles. Recommend PET. Gyn Onc can re-evaluate possible surgical candidacy for her after imaging.  The following procedures/referrals were also placed: No orders of the defined types were placed in this encounter.  Clinical Trial Status: none available   Staging used: AJCC Stage Group pT4b N1  National site-specific guidelines ASCO were discussed with respect to the case.  Tumor board is a meeting of clinicians from various specialty areas who evaluate and discuss patients for whom a multidisciplinary approach is being considered. Final determinations in the plan of care are those of the provider(s). The responsibility for follow up of recommendations given during tumor board is that of the provider.   Today's extended care, comprehensive team conference, Tracy Goodman was not present for the discussion and was not examined.

## 2020-10-30 NOTE — Progress Notes (Signed)
START ON PATHWAY REGIMEN - Uterine     A cycle is every 21 days:     Paclitaxel      Carboplatin   **Always confirm dose/schedule in your pharmacy ordering system**  Patient Characteristics: Serous Carcinoma, Newly Diagnosed (Clinical Staging), Nonsurgical Candidate, Stage III or IV, HER2 Negative/Unknown Histology: Serous Carcinoma Therapeutic Status: Newly Diagnosed (Clinical Staging) AJCC M Category: pM1 Surgical Candidacy: Nonsurgical Candidate AJCC 8 Stage Grouping: IVB AJCC T Category: cT4 AJCC N Category: cN2 HER2 Status: Negative  Intent of Therapy: Non-Curative / Palliative Intent, Discussed with Patient

## 2020-10-30 NOTE — Telephone Encounter (Signed)
I called pt to let her know that she can take her b/p in the am. The pt. Had asked me to check and see if she should take b/p med the day of port placement. I also checked with the patient about an appt. That was today with Dr. Erlene Quan. At first she said they she did not know about it and then she said that I told her that I did not have any appointments this week. I told her that after I had gave her the instructions for the port insertion that pt wanted to know her next appt for cancer center. I told her the next appt we have made for her is the pet /ct scan for 5/16. The patient then told me that she took that as no appts for for any md office until 5/16. The pt. Is fine with [place] getting her a new appt to see dr Erlene Quan so that she can try to help the the kidney function.

## 2020-10-30 NOTE — H&P (View-Only) (Signed)
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  She was seen by GYN Dr. Kenton Kingfisher after she underwent a CT abdomen for symptoms of lower abdominal pain.  CT scan showed small left pleural effusion.  Multiple prominent borderline enlarged retroperitoneal and pelvic lymph nodes.  These ranged from 1 to 1.3 cm.  Masslike enlargement of the cervix measuring 3.8 x 3 x 2.9 cm.  Extensive omental nodularity most notable in the low anatomy pelvis.  Patient underwent cervical biopsy in GYN office but biopsy results did not show any evidence of malignancy  Patient subsequently admitted to the hospital for AKI and underwent bilateral ureteral stenting as well as biopsies from the bladder which showed high-grade serous carcinoma.  Creatinine went back down to 2.2 but never returned back to baseline.  Following that creatinine went up to 7 and she was rehospitalized requiring temporary Foley placement.  She has not required nephrostomy tubes so far and present creatinine between 2.5-3.5.  Her hospitalization for new right lower extremity DVT for which she is currently on Lovenox and Coumadin  Interval history-patient states that she is doing better since her hospitalization.  She still reports ongoing fatigue and exertional shortness of breath.  She is here with her daughter today  ECOG PS-  1-2 Pain scale- 2   Review of systems- Review of Systems  Constitutional: Positive for malaise/fatigue. Negative for chills, fever and weight loss.  HENT: Negative for congestion, ear discharge and nosebleeds.   Eyes: Negative for blurred vision.  Respiratory: Positive for shortness of breath. Negative for cough, hemoptysis, sputum production and wheezing.   Cardiovascular: Negative for chest pain, palpitations, orthopnea and claudication.  Gastrointestinal: Negative for abdominal pain, blood in stool, constipation, diarrhea, heartburn, melena, nausea and vomiting.  Genitourinary: Negative for dysuria, flank pain, frequency, hematuria and urgency.  Musculoskeletal: Negative for back pain, joint pain and myalgias.  Skin: Negative for rash.  Neurological: Negative for dizziness, tingling, focal weakness, seizures, weakness and headaches.  Endo/Heme/Allergies: Does not bruise/bleed easily.  Psychiatric/Behavioral: Negative for depression and suicidal ideas. The patient does not have insomnia.       No Known Allergies   Past Medical History:  Diagnosis Date  . Diverticulitis   . Hypertension   . Primary high grade serous adenocarcinoma of ovary [facility])      Past Surgical History:  Procedure Laterality Date  . CESAREAN SECTION    . CYSTOSCOPY W/ URETERAL STENT PLACEMENT Bilateral 10/10/2020   Procedure: CYSTOSCOPY WITH RETROGRADE PYELOGRAM/URETERAL STENT PLACEMENT;  Surgeon: Hollice Espy, MD;  Location: [facility] ORS;  Service: Urology;  Laterality: Bilateral;  . FULGURATION OF BLADDER TUMOR N/A 10/10/2020   Procedure: FULGURATION OF BLADDER TUMOR;  Surgeon: Hollice Espy, MD;  Location: [facility] ORS;  Service: Urology;  Laterality: N/A;  . WRIST FRACTURE SURGERY Left     Social History   Socioeconomic History  . Marital status: Married    Spouse name: Tempie Donning  . Number of children: 3  .  Years of education: high school  . Highest education level: Not on file  Occupational History   . Not on file  Tobacco Use  . Smoking status: Never Smoker  . Smokeless tobacco: Never Used  Substance and Sexual Activity  . Alcohol use: Not Currently  . Drug use: Never  . Sexual activity: Yes    Birth control/protection: Post-menopausal  Other Topics Concern  . Not on file  Social History Narrative   03/12/20   From: the area   Living: with Tempie Donning, husband (2012) and one step son   Work: school bus driver      Family: 3 adult children - [place], [place], [place] Drummer - 6-7 grandchildren      Enjoys: singing in the choir at church      Exercise: weight lifting in the morning, stretches   Diet: varied, tries to eat healthy - baked salmon and fish/chicken/fried food      Safety   Seat belts: Yes    Guns: No   Safe in relationships: Yes    Social Determinants of Radio broadcast assistant Strain: Not on file  Food Insecurity: Not on file  Transportation Needs: Not on file  Physical Activity: Not on file  Stress: Not on file  Social Connections: Not on file  Intimate Partner Violence: Not on file    Family History  Problem Relation Age of Onset  . COPD Mother   . Heart failure Mother      Current Outpatient Medications:  .  acetaminophen (TYLENOL) 325 MG tablet, Take 2 tablets (650 mg total) by mouth every 6 (six) hours as needed for mild pain, fever or headache (or Fever >/= 101)., Disp: , Rfl:  .  amLODipine (NORVASC) 10 MG tablet, Take 1 tablet (10 mg total) by mouth daily., Disp: 30 tablet, Rfl: 0 .  enoxaparin (LOVENOX) 120 MG/0.8ML injection, Inject 0.8 mLs (120 mg total) into the skin 2 (two) times daily for 7 days. Continue Lovenox injections until the INR is between 2 -3, Disp: 11.2 mL, Rfl: 0 .  saccharomyces boulardii (FLORASTOR) 250 MG capsule, Take 250 mg by mouth 2 (two) times daily., Disp: , Rfl:  .  warfarin (COUMADIN) 5 MG tablet, Please take 1-1/2 tablet (7.5 mg) for 3 days, then continue 5 mg daily.  Coumadin dose will be adjusted by your doctor at  the appointment., Disp: 30 tablet, Rfl: 11 .  dexamethasone (DECADRON) 4 MG tablet, Take 2 tablets by mouth once a day starting the day after chemotherapy. Continue for 3 days total. Take with food., Disp: 30 tablet, Rfl: 1 .  lidocaine-prilocaine (EMLA) cream, Apply to affected area once, Disp: 30 g, Rfl: 3 .  ondansetron (ZOFRAN) 8 MG tablet, Take 1 tablet (8 mg total) by mouth 2 (two) times daily as needed. Start on the third day after chemotherapy., Disp: 30 tablet, Rfl: 1 .  prochlorperazine (COMPAZINE) 10 MG tablet, Take 1 tablet (10 mg total) by mouth every 6 (six) hours as needed (Nausea or vomiting)., Disp: 30 tablet, Rfl: 1  Physical exam:  Vitals:   10/29/20 0934  BP: (!) 154/72  Pulse: (!) 125  Resp: 18  Temp: 98 F (36.7 C)  TempSrc: Oral   Physical Exam Constitutional:      General: She is not in acute distress.    Appearance: She is obese.     Comments: Sitting in a wheelchair.  Appears in no acute distress  Cardiovascular:     Rate and Rhythm:  Normal rate and regular rhythm.     Heart sounds: Normal heart sounds.  Pulmonary:     Effort: Pulmonary effort is normal.     Breath sounds: Normal breath sounds.  Abdominal:     General: Bowel sounds are normal.     Palpations: Abdomen is soft.  Musculoskeletal:     Comments: Bilateral lower extremity +1 edema  Skin:    General: Skin is warm and dry.  Neurological:     Mental Status: She is alert and oriented to person, place, and time.      CMP Latest Ref Rng & Units 10/28/2020  Glucose 70 - 99 mg/dL 109(H)  BUN 8 - 23 mg/dL 19  Creatinine 0.44 - 1.00 mg/dL 2.97(H)  Sodium 135 - 145 mmol/L 140  Potassium 3.5 - 5.1 mmol/L 3.6  Chloride 98 - 111 mmol/L 110  CO2 22 - 32 mmol/L 20(L)  Calcium 8.9 - 10.3 mg/dL 8.4(L)  Total Protein 6.5 - 8.1 g/dL -  Total Bilirubin 0.3 - 1.2 mg/dL -  Alkaline Phos 38 - 126 U/L -  AST 15 - 41 U/L -  ALT 0 - 44 U/L -   CBC Latest Ref Rng & Units 10/28/2020  WBC 4.0 - 10.5 K/uL 8.7   Hemoglobin 12.0 - 15.0 g/dL 9.8(L)  Hematocrit 36.0 - 46.0 % 32.2(L)  Platelets 150 - 400 K/uL 354       NM Pulmonary Perfusion  Addendum Date: 10/26/2020   ADDENDUM REPORT: 10/26/2020 09:28 ADDENDUM: Study discussed by telephone with Ripudeep Rai MD on 10/26/2020 at 0923 hours. The patient is being actively anticoagulated for DVT. We also discussed that I estimate the volume of left pleural effusion is small based on the recent comparisons. Electronically Signed   By: Genevie Ann M.D.   On: 10/26/2020 09:28   Result Date: 10/26/2020 CLINICAL DATA:  65 year old female with shortness of breath. Positive right calf DVT. EXAM: NUCLEAR MEDICINE PERFUSION LUNG SCAN TECHNIQUE: Perfusion images were obtained in multiple projections after intravenous injection of radiopharmaceutical. Ventilation scans intentionally deferred if perfusion scan and chest x-ray adequate for interpretation during COVID 19 epidemic. RADIOPHARMACEUTICALS:  4.4 mCi Tc-33mMAA IV COMPARISON:  Portable chest 0032 hours today. CT Abdomen and Pelvis 02022-05-06 FINDINGS: Pronounced asymmetry, decreased perfusion activity throughout much of the left lung. Left pleural effusion on recent comparison Scala although fairly small. Additional more discrete perfusion defects in the left upper lung on the anterior and oblique images. Fairly homogeneous activity in the right lung. IMPRESSION: Asymmetric decreased activity in the left lung, both global and focal, which seems out of proportion to that expected from the left pleural effusion seen on recent comparisons. Therefore this exam is suspicious for Acute PE. Electronically Signed: By: HGenevie AnnM.D. On: 10/26/2020 09:18   [place]NAL  Result Date: 10/09/2020 CLINICAL DATA:  Rising creatinine.  Advanced gyn malignancy EXAM: RENAL / URINARY TRACT ULTRASOUND COMPLETE COMPARISON:  CT abdomen pelvis 09/30/2020 FINDINGS: Right Kidney: Renal measurements: 10.8 x 5.2 x 5.6 cm = volume: 162 mL. Mild right  hydronephrosis unchanged from prior CT. No renal mass. Left Kidney: Renal measurements: 11.1 x 5.2 x 5.2 cm = volume: 156 mL. Mild left hydronephrosis unchanged from recent CT. No left renal mass. Bladder: Urinary jets not seen during 10 minutes of observation. Other: Trace ascites IMPRESSION: Mild bilateral renal obstruction likely due to distal ureteral obstruction. No change from recent CT. Electronically Signed   By: CFranchot GalloM.D.   On: 10/09/2020  16:17   US Venous Img Lower Bilateral  Result Date: 10/26/2020 CLINICAL DATA:  65 year old female with bilateral lower extremity edema. EXAM: BILATERAL LOWER EXTREMITY VENOUS DOPPLER ULTRASOUND TECHNIQUE: Gray-scale sonography with graded compression, as well as color Doppler and duplex ultrasound were performed to evaluate the lower extremity deep venous systems from the level of the common femoral vein and including the common femoral, femoral, profunda femoral, popliteal and calf veins including the posterior tibial, peroneal and gastrocnemius veins when visible. The superficial great saphenous vein was also interrogated. Spectral Doppler was utilized to evaluate flow at rest and with distal augmentation maneuvers in the common femoral, femoral and popliteal veins. COMPARISON:  None. FINDINGS: RIGHT LOWER EXTREMITY Common Femoral Vein: No evidence of thrombus. Normal compressibility, respiratory phasicity and response to augmentation. Saphenofemoral Junction: No evidence of thrombus. Normal compressibility and flow on color Doppler imaging. Profunda Femoral Vein: No evidence of thrombus. Normal compressibility and flow on color Doppler imaging. Femoral Vein: No evidence of thrombus. Normal compressibility, respiratory phasicity and response to augmentation. Popliteal Vein: No evidence of thrombus. Normal compressibility, respiratory phasicity and response to augmentation. Calf Veins: There is thrombus in the posterior tibial and peroneal veins with  noncompressibility of the vessels. Five Superficial Great Saphenous Vein: No evidence of thrombus. Normal compressibility. Venous Reflux:  None. Other Findings:  None. LEFT LOWER EXTREMITY Common Femoral Vein: No evidence of thrombus. Normal compressibility, respiratory phasicity and response to augmentation. Saphenofemoral Junction: No evidence of thrombus. Normal compressibility and flow on color Doppler imaging. Profunda Femoral Vein: No evidence of thrombus. Normal compressibility and flow on color Doppler imaging. Femoral Vein: No evidence of thrombus. Normal compressibility, respiratory phasicity and response to augmentation. Popliteal Vein: No evidence of thrombus. Normal compressibility, respiratory phasicity and response to augmentation. Calf Veins: No evidence of thrombus. Normal compressibility and flow on color Doppler imaging. Superficial Great Saphenous Vein: No evidence of thrombus. Normal compressibility. Venous Reflux:  None. Other Findings:  None. IMPRESSION: 1. Occlusive thrombus in the right calf involving the posterior tibial and peroneal veins. 2. No DVT in the left lower extremity. These results were called by telephone at the time of interpretation on 10/26/2020 at 2:49 am to provider [facility] , who verbally acknowledged these results. Electronically Signed   By: Tracy Crete M.D.   On: 10/26/2020 02:51   DG Chest Portable 1 View  Result Date: 10/26/2020 CLINICAL DATA:  Shortness of breath EXAM: PORTABLE CHEST 1 VIEW COMPARISON:  10/19/2020 FINDINGS: Mild increased opacity at the left lung base. No pulmonary edema. No pleural effusion or pneumothorax. Normal cardiomediastinal contours. IMPRESSION: Mild increased opacity at the left lung base may indicate atelectasis. Electronically Signed   By: Ulyses Jarred M.D.   On: 10/26/2020 00:56   DG Chest Port 1 View  Result Date: 10/19/2020 CLINICAL DATA:  66 year old female with shortness of breath. EXAM: PORTABLE CHEST 1 VIEW  COMPARISON:  Portable chest 10/18/2020 and earlier. FINDINGS: Portable AP semi upright view at 0713 hours. Continued lower lung volumes than baseline compared to last month. Mediastinal contours remain normal. Visualized tracheal air column is within normal limits. No pneumothorax. No pleural effusion. Mild crowding of lung markings including along the right minor fissure is stable since 10/15/2020. Paucity of bowel gas in the upper abdomen. No acute osseous abnormality identified. IMPRESSION: Stable low lung volumes and suspected mild atelectasis. No new cardiopulmonary abnormality. Electronically Signed   By: Genevie Ann M.D.   On: 10/19/2020 08:00   DG Chest Port 1  View  Result Date: 10/18/2020 CLINICAL DATA:  Fever. EXAM: PORTABLE CHEST 1 VIEW COMPARISON:  October 15, 2020. FINDINGS: The heart size and mediastinal contours are within normal limits. Right lung is clear. Minimal left basilar subsegmental atelectasis is noted and decreased compared to prior exam. The visualized skeletal structures are unremarkable. IMPRESSION: Improved left basilar subsegmental atelectasis. Electronically Signed   By: Marijo Conception M.D.   On: 10/18/2020 08:51   DG Chest Port 1 View  Result Date: 10/15/2020 CLINICAL DATA:  AK I EXAM: PORTABLE CHEST 1 VIEW COMPARISON:  Same day CT abdomen pelvis and chest radiograph September 04, 2020. FINDINGS: The heart size and mediastinal contours are within normal limits. Low lung volumes with bibasilar linear atelectasis. Small left pleural effusion with adjacent consolidative opacity. No visible pneumothorax. The visualized skeletal structures are unchanged. IMPRESSION: Small left pleural effusion with adjacent left lower lobe consolidative opacity, which may reflect atelectasis and/or infection. Electronically Signed   By: Dahlia Bailiff MD   On: 10/15/2020 16:03   DG OR UROLOGY CYSTO IMAGE ([place])  Result Date: 10/10/2020 There is no interpretation for this exam.  This order is for  images obtained during a surgical procedure.  Please See "Surgeries" Tab for more information regarding the procedure.   US BREAST LTD UNI RIGHT INC AXILLA  Result Date: 10/01/2020 CLINICAL DATA:  65 year old female presenting with a new palpable area of concern in the right breast. EXAM: DIGITAL DIAGNOSTIC UNILATERAL RIGHT MAMMOGRAM WITH TOMOSYNTHESIS AND CAD; ULTRASOUND RIGHT BREAST LIMITED TECHNIQUE: Right digital diagnostic mammography and breast tomosynthesis was performed. The images were evaluated with computer-aided detection.; Targeted ultrasound examination of the right breast was performed COMPARISON:  Previous exam(s). ACR Breast Density Category b: There are scattered areas of fibroglandular density. FINDINGS: Mammogram: A skin BB marks the site of concern reported by the patient in the upper outer right breast. A spot tangential view of this area is performed in addition to standard views. There is a round circumscribed mass with central fatty hilum measuring approximately 1.2 cm at the palpable site, most likely an intramammary lymph node. There are no additional new findings elsewhere in the right breast. On physical exam at site of concern reported by the patient I feel a discrete mass. Ultrasound: Targeted ultrasound is performed at the palpable site of concern in the right breast at 9:30 o'clock 9 cm from the nipple demonstrating a round circumscribed hypoechoic mass with central fatty hilum measuring 1.3 x 1.3 x 1.1 cm, consistent with a intramammary lymph node. There is cortical thickening measuring 0.5 cm. This corresponds to the mammographic finding. Targeted ultrasound of the right axilla demonstrates normal lymph nodes. IMPRESSION: Abnormal intramammary lymph node in the right breast at 9:30 o'clock at the palpable site reported by the patient. RECOMMENDATION: Ultrasound-guided core needle biopsy of the right breast mass at 9:30 o'clock. I have discussed the findings and recommendations  with the patient who agrees to proceed with biopsy. The patient will be contacted by our scheduler to arrange the biopsy appointment. BI-RADS CATEGORY  4: Suspicious. Electronically Signed   By: Audie Pinto M.D.   On: 10/01/2020 11:23   MM DIAG BREAST TOMO UNI RIGHT  Result Date: 10/01/2020 CLINICAL DATA:  65 year old female presenting with a new palpable area of concern in the right breast. EXAM: DIGITAL DIAGNOSTIC UNILATERAL RIGHT MAMMOGRAM WITH TOMOSYNTHESIS AND CAD; ULTRASOUND RIGHT BREAST LIMITED TECHNIQUE: Right digital diagnostic mammography and breast tomosynthesis was performed. The images were evaluated with computer-aided detection.;  Targeted ultrasound examination of the right breast was performed COMPARISON:  Previous exam(s). ACR Breast Density Category b: There are scattered areas of fibroglandular density. FINDINGS: Mammogram: A skin BB marks the site of concern reported by the patient in the upper outer right breast. A spot tangential view of this area is performed in addition to standard views. There is a round circumscribed mass with central fatty hilum measuring approximately 1.2 cm at the palpable site, most likely an intramammary lymph node. There are no additional new findings elsewhere in the right breast. On physical exam at site of concern reported by the patient I feel a discrete mass. Ultrasound: Targeted ultrasound is performed at the palpable site of concern in the right breast at 9:30 o'clock 9 cm from the nipple demonstrating a round circumscribed hypoechoic mass with central fatty hilum measuring 1.3 x 1.3 x 1.1 cm, consistent with a intramammary lymph node. There is cortical thickening measuring 0.5 cm. This corresponds to the mammographic finding. Targeted ultrasound of the right axilla demonstrates normal lymph nodes. IMPRESSION: Abnormal intramammary lymph node in the right breast at 9:30 o'clock at the palpable site reported by the patient. RECOMMENDATION:  Ultrasound-guided core needle biopsy of the right breast mass at 9:30 o'clock. I have discussed the findings and recommendations with the patient who agrees to proceed with biopsy. The patient will be contacted by our scheduler to arrange the biopsy appointment. BI-RADS CATEGORY  4: Suspicious. Electronically Signed   By: Audie Pinto M.D.   On: 10/01/2020 11:23   CT Renal Stone Study  Result Date: 10/15/2020 CLINICAL DATA:  Renal insufficiency. EXAM: CT ABDOMEN AND PELVIS WITHOUT CONTRAST TECHNIQUE: Multidetector CT imaging of the abdomen and pelvis was performed following the standard protocol without IV contrast. COMPARISON:  09/30/2020. FINDINGS: Lower chest: Basilar atelectasis again noted bilaterally with interval increase in posterior left lower lobe collapse/consolidative opacity. Small left pleural effusion has progressed in the interval. Hepatobiliary: No focal abnormality in the liver on this study without intravenous contrast. Layering sludge noted in the distended gallbladder. No intrahepatic or extrahepatic biliary dilation. Pancreas: No focal mass lesion. No dilatation of the main duct. No intraparenchymal cyst. No peripancreatic edema. Spleen: No splenomegaly. No focal mass lesion. Adrenals/Urinary Tract: No adrenal nodule or mass. Mild hydronephrosis in both kidneys is similar to prior with bilateral internal ureteral stents visualized. No associated hydroureter. The bladder is decompressed. Stomach/Bowel: Tiny hiatal hernia. Stomach otherwise unremarkable. Duodenum is normally positioned as is the ligament of Treitz. No small bowel wall thickening. No small bowel dilatation. The terminal ileum is normal. The appendix is not well visualized, but there is no edema or inflammation in the region of the cecum. No gross colonic mass. No colonic wall thickening. Diverticular changes are noted in the left colon without evidence of diverticulitis. Vascular/Lymphatic: No abdominal aortic aneurysm.  No change in the retroperitoneal abdominopelvic lymphadenopathy, better characterized on the recent CT scan performed with intravenous contrast material. Reproductive: The uterus is unremarkable. Similar prominence of the cervix. There is no adnexal mass. Other: Interval increase in small to moderate volume abdominopelvic ascites with edema and nodularity in the omentum inferiorly, as before. Musculoskeletal: Small bilateral groin hernias contain only fat. No worrisome lytic or sclerotic osseous abnormality. IMPRESSION: 1. Mild bilateral hydronephrosis is similar to prior, now with bilateral internal ureteral stents in situ. No evidence for hydroureter in the bladder is decompressed. 2. Similar appearance of upper normal to mildly enlarged retroperitoneal and pelvic sidewall lymphadenopathy. This was better evaluated on  recent CT with IV contrast. 3. Interval progression of ascites, now small to moderate volume. There is associated edema/caking in the omentum concerning for metastatic disease. 4. Small bilateral groin hernias contain only fat. 5. Left colonic diverticulosis without diverticulitis. Electronically Signed   By: Misty Stanley M.D.   On: 10/15/2020 13:06     Assessment and plan- Patient is a 65 y.o. female with newly diagnosed stage [id number] high-grade serous carcinoma possibly endometrial primary here to discuss further management  I discussed CT findings and pathology with the patient and her daughter.  Patient has stage IV high-grade serous carcinoma and case was also discussed on 10/30/2020 at tumor board and consensus was that this was behaving clinically more as an endometrial primary than ovarian primary.  She is not upfront resectable and I would recommend chemotherapy involving carboplatin and Taxol.  I would like to give it weekly for the first 3 cycles and see how she tolerates it along with her renal functions and then switch her to every 3-week regimen thereafter.  Discussed risks and  benefits of chemotherapy including all but not limited to nausea, vomiting, low blood counts, risk of infections and hospitalizations.  Risk of peripheral neuropathy and infusion reaction associated with Taxol.  There is also risk of renal dysfunction associated with carboplatin which we will need to monitor closely during treatment and I would bring her twice a week for BMP checking as well as possible IV fluids.  If she has an excellent response to treatment after 3 cycles surgery would be a consideration down the line.  Treatment is being given with a palliative intent.  Given that she is obese we are going ahead with adjusted body weight instead of actual body weight and her calculated creatinine clearance is about 22 mL/min  Right lower extremity DVT: Given her renal dysfunction it would be okay to keep her on Coumadin for now and if her renal functions improve I will switch her to NOAC in the future.  Patient is presently on Lovenox and Coumadin.  Plan is forPort placement on 10/31/2020 when I will therefore have her hold her Coumadin today and tomorrow.  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  Mild bilateral hydronephrosis noted on CT scan and patient is s/p bilateral ureteral stents.  She follows up with urology and plan is to hold off on nephrostomy tube at this time.  If there is progressive worsening of her renal function that may be considered down the line  Patient's tumor was MSI stable and HER2 negative.  We will also send of NGS testing  Cancer Staging Serous adenocarcinoma of uterus, stage 4 ([place]) Staging form: Corpus Uteri - Carcinoma and Carcinosarcoma, AJCC 8th Edition - Clinical stage from  10/30/2020: FIGO Stage IVB (cT4, cN2a, cM1) - Signed by Sindy Guadeloupe, MD on 10/30/2020 Histologic grade (G): G3 Histologic grading system: 3 grade system     Visit Diagnosis 1. Serous adenocarcinoma of uterus, stage 4 ([place])   2. Goals of care, counseling/discussion   3. AKI (acute kidney injury) ([place])   4. Acute deep vein thrombosis (DVT) of calf muscle vein of right lower extremity (HCC)      Dr. Astrid Divine  Janese Banks, MD, MPH [place] [facility] [phone] 10/30/2020 1:12 PM

## 2020-10-31 ENCOUNTER — Telehealth: Payer: Self-pay | Admitting: *Deleted

## 2020-10-31 ENCOUNTER — Encounter: Payer: Self-pay | Admitting: Vascular Surgery

## 2020-10-31 ENCOUNTER — Telehealth: Payer: Self-pay

## 2020-10-31 ENCOUNTER — Ambulatory Visit
Admission: RE | Admit: 2020-10-31 | Discharge: 2020-10-31 | Disposition: A | Discharge status: Home / Self Care | Payer: BC Managed Care – PPO | Attending: Vascular Surgery | Admitting: Vascular Surgery

## 2020-10-31 ENCOUNTER — Other Ambulatory Visit: Payer: Self-pay

## 2020-10-31 ENCOUNTER — Encounter
Admission: RE | Disposition: A | Discharge status: Home / Self Care | Payer: Self-pay | Source: Home / Self Care | Attending: Vascular Surgery

## 2020-10-31 ENCOUNTER — Ambulatory Visit: Payer: BC Managed Care – PPO | Admitting: Family Medicine

## 2020-10-31 ENCOUNTER — Other Ambulatory Visit (INDEPENDENT_AMBULATORY_CARE_PROVIDER_SITE_OTHER): Payer: Self-pay | Admitting: Nurse Practitioner

## 2020-10-31 DIAGNOSIS — Z7901 Long term (current) use of anticoagulants: Secondary | ICD-10-CM | POA: Diagnosis not present

## 2020-10-31 DIAGNOSIS — I82461 Acute embolism and thrombosis of right calf muscular vein: Secondary | ICD-10-CM | POA: Insufficient documentation

## 2020-10-31 DIAGNOSIS — C569 Malignant neoplasm of unspecified ovary: Secondary | ICD-10-CM

## 2020-10-31 DIAGNOSIS — Z79899 Other long term (current) drug therapy: Secondary | ICD-10-CM | POA: Insufficient documentation

## 2020-10-31 DIAGNOSIS — C7982 Secondary malignant neoplasm of genital organs: Secondary | ICD-10-CM | POA: Insufficient documentation

## 2020-10-31 HISTORY — PX: PORTA CATH INSERTION: CATH118285

## 2020-10-31 LAB — CULTURE, BLOOD (ROUTINE X 2)
Culture: NO GROWTH
Culture: NO GROWTH
Special Requests: ADEQUATE
Special Requests: ADEQUATE

## 2020-10-31 SURGERY — PORTA CATH INSERTION
Anesthesia: Moderate Sedation

## 2020-10-31 MED ORDER — SODIUM CHLORIDE 0.9 % IV SOLN: INTRAVENOUS | Status: DC

## 2020-10-31 MED ORDER — METHYLPREDNISOLONE SODIUM SUCC 125 MG IJ SOLR: 125.0000 mg | Freq: Once | INTRAMUSCULAR | Status: DC | PRN

## 2020-10-31 MED ORDER — FENTANYL CITRATE (PF) 100 MCG/2ML IJ SOLN
INTRAMUSCULAR | Status: DC | PRN
  Administered 2020-10-31: 50 ug via INTRAVENOUS

## 2020-10-31 MED ORDER — DIPHENHYDRAMINE HCL 50 MG/ML IJ SOLN: 50.0000 mg | Freq: Once | INTRAMUSCULAR | Status: DC | PRN

## 2020-10-31 MED ORDER — MIDAZOLAM HCL 5 MG/5ML IJ SOLN
INTRAMUSCULAR | Status: AC
  Filled 2020-10-31: qty 5

## 2020-10-31 MED ORDER — FAMOTIDINE 20 MG PO TABS: 40.0000 mg | ORAL_TABLET | Freq: Once | ORAL | Status: DC | PRN

## 2020-10-31 MED ORDER — CEFAZOLIN SODIUM-DEXTROSE 2-4 GM/100ML-% IV SOLN: 2.0000 g | Freq: Once | INTRAVENOUS | Status: DC

## 2020-10-31 MED ORDER — HYDROMORPHONE HCL 1 MG/ML IJ SOLN: 1.0000 mg | Freq: Once | INTRAMUSCULAR | Status: DC | PRN

## 2020-10-31 MED ORDER — MIDAZOLAM HCL 2 MG/ML PO SYRP: 8.0000 mg | ORAL_SOLUTION | Freq: Once | ORAL | Status: DC | PRN

## 2020-10-31 MED ORDER — CHLORHEXIDINE GLUCONATE CLOTH 2 % EX PADS: 6.0000 | MEDICATED_PAD | Freq: Every day | CUTANEOUS | Status: DC

## 2020-10-31 MED ORDER — SODIUM CHLORIDE 0.9 % IV SOLN
Freq: Once | INTRAVENOUS | Status: DC
  Filled 2020-10-31: qty 2

## 2020-10-31 MED ORDER — ONDANSETRON HCL 4 MG/2ML IJ SOLN: 4.0000 mg | Freq: Four times a day (QID) | INTRAMUSCULAR | Status: DC | PRN

## 2020-10-31 MED ORDER — MIDAZOLAM HCL 2 MG/2ML IJ SOLN
INTRAMUSCULAR | Status: DC | PRN
Start: 2020-10-31 — End: 2020-10-31
  Administered 2020-10-31: 2 mg via INTRAVENOUS

## 2020-10-31 MED ORDER — CEFAZOLIN SODIUM-DEXTROSE 2-4 GM/100ML-% IV SOLN
INTRAVENOUS | Status: AC
  Filled 2020-10-31: qty 100

## 2020-10-31 MED ORDER — FENTANYL CITRATE (PF) 100 MCG/2ML IJ SOLN
INTRAMUSCULAR | Status: AC
  Filled 2020-10-31: qty 2

## 2020-10-31 SURGICAL SUPPLY — 14 items
ADH SKN CLS APL DERMABOND .7 (GAUZE/BANDAGES/DRESSINGS) ×1
COVER PROBE U/S 5X48 (MISCELLANEOUS) ×2 IMPLANT
COVER SURGICAL LIGHT HANDLE (MISCELLANEOUS) ×2 IMPLANT
DERMABOND ADVANCED (GAUZE/BANDAGES/DRESSINGS) ×1
DERMABOND ADVANCED .7 DNX12 (GAUZE/BANDAGES/DRESSINGS) ×1 IMPLANT
KIT PORT POWER 8FR ISP CVUE (Port) ×2 IMPLANT
PACK ANGIOGRAPHY (CUSTOM PROCEDURE TRAY) ×2 IMPLANT
PENCIL ELECTRO HAND CTR (MISCELLANEOUS) ×2 IMPLANT
SPONGE XRAY 4X4 16PLY STRL (MISCELLANEOUS) ×2 IMPLANT
SUT MNCRL AB 4-0 PS2 18 (SUTURE) ×2 IMPLANT
SUT VIC AB 3-0 SH 27 (SUTURE) ×2
SUT VIC AB 3-0 SH 27X BRD (SUTURE) ×1 IMPLANT
TOWEL OR 17X26 4PK STRL BLUE (TOWEL DISPOSABLE) ×2 IMPLANT
TUBING CONNECTING 10 (TUBING) ×4 IMPLANT

## 2020-10-31 NOTE — Discharge Instructions (Signed)
Implanted Port Home Guide  An implanted port is a type of central line that is placed under the skin. Central lines are used to provide IV access when treatment or nutrition needs to be given through a person's veins. Implanted ports are used for long-term IV access. An implanted port may be placed because: You need IV medicine that would be irritating to the small veins in your hands or arms. You need long-term IV medicines, such as antibiotics. You need IV nutrition for a long period. You need frequent blood draws for lab tests. You need dialysis.   Implanted ports are usually placed in the chest area, but they can also be placed in the upper arm, the abdomen, or the leg. An implanted port has two main parts: Reservoir. The reservoir is round and will appear as a small, raised area under your skin. The reservoir is the part where a needle is inserted to give medicines or draw blood. Catheter. The catheter is a thin, flexible tube that extends from the reservoir. The catheter is placed into a large vein. Medicine that is inserted into the reservoir goes into the catheter and then into the vein.   How will I care for my incision  You may shower tomorrow  How is my port accessed? Special steps must be taken to access the port: Before the port is accessed, a numbing cream can be placed on the skin. This helps numb the skin over the port site. Your health care provider uses a sterile technique to access the port. Your health care provider must put on a mask and sterile gloves. The skin over your port is cleaned carefully with an antiseptic and allowed to dry. The port is gently pinched between sterile gloves, and a needle is inserted into the port. Only "non-coring" port needles should be used to access the port. Once the port is accessed, a blood return should be checked. This helps ensure that the port is in the vein and is not clogged. If your port needs to remain accessed for a constant  infusion, a clear (transparent) bandage will be placed over the needle site. The bandage and needle will need to be changed every week, or as directed by your health care provider.   What is flushing? Flushing helps keep the port from getting clogged. Follow your health care provider's instructions on how and when to flush the port. Ports are usually flushed with saline solution or a medicine called heparin. The need for flushing will depend on how the port is used. If the port is used for intermittent medicines or blood draws, the port will need to be flushed: After medicines have been given. After blood has been drawn. As part of routine maintenance. If a constant infusion is running, the port may not need to be flushed.   How long will my port stay implanted? The port can stay in for as long as your health care provider thinks it is needed. When it is time for the port to come out, surgery will be done to remove it. The procedure is similar to the one performed when the port was put in. When should I seek immediate medical care? When you have an implanted port, you should seek immediate medical care if: You notice a bad smell coming from the incision site. You have swelling, redness, or drainage at the incision site. You have more swelling or pain at the port site or the surrounding area. You have a fever that   is not controlled with medicine.   This information is not intended to replace advice given to you by your health care provider. Make sure you discuss any questions you have with your health care provider. Document Released: 06/08/2005 Document Revised: 11/14/2015 Document Reviewed: 02/13/2013 Elsevier Interactive Patient Education  2017 Elsevier Inc.    

## 2020-10-31 NOTE — Telephone Encounter (Signed)
[facility] CDx requested on specimen [id number], urinary bladder, transurethral biopsy, collected 10/10/2020.

## 2020-10-31 NOTE — Interval H&P Note (Signed)
History and Physical Interval Note:  10/31/2020 10:59 AM  Tracy Goodman  has presented today for surgery, with the diagnosis of Port Placement   Ovarian Cancer.  The various methods of treatment have been discussed with the patient and family. After consideration of risks, benefits and other options for treatment, the patient has consented to  Procedure(s): PORTA CATH INSERTION (N/A) as a surgical intervention.  The patient's history has been reviewed, patient examined, no change in status, stable for surgery.  I have reviewed the patient's chart and labs.  Questions were answered to the patient's satisfaction.     Leotis Pain

## 2020-10-31 NOTE — Op Note (Signed)
      [facility]       Operative Note  Date: 10/31/2020  Preoperative diagnosis:  1. Ovarian cancer  Postoperative diagnosis:  Same as above  Procedures: #1. Ultrasound guidance for vascular access to the right internal jugular vein. #2. Fluoroscopic guidance for placement of catheter. #3. Placement of CT compatible Port-A-Cath, right internal jugular vein.  Surgeon: Leotis Pain, MD.   Anesthesia: Local with moderate conscious sedation for approximately 13  minutes using 2 mg of Versed and 50 mcg of Fentanyl  Fluoroscopy time: less than 1 minute  Contrast used: 0  Estimated blood loss: 5 cc  Indication for the procedure:  The patient is a 65 y.o.female with ovarian cancer.  The patient needs a Port-A-Cath for durable venous access, chemotherapy, lab draws, and CT scans. We are asked to place this. Risks and benefits were discussed and informed consent was obtained.  Description of procedure: The patient was brought to the vascular and interventional radiology suite.  Moderate conscious sedation was administered throughout the procedure during a face to face encounter with the patient with my supervision of the RN administering medicines and monitoring the patient's vital signs, pulse oximetry, telemetry and mental status throughout from the start of the procedure until the patient was taken to the recovery room. The right neck chest and shoulder were sterilely prepped and draped, and a sterile surgical field was created. Ultrasound was used to help visualize a patent right internal jugular vein. This was then accessed under direct ultrasound guidance without difficulty with the Seldinger needle and a permanent image was recorded. A J-wire was placed. After skin nick and dilatation, the peel-away sheath was then placed over the wire. I then anesthetized an area under the clavicle approximately 1-2 fingerbreadths. A transverse incision was created and an inferior pocket  was created with electrocautery and blunt dissection. The port was then brought onto the field, placed into the pocket and secured to the chest wall with 2 Prolene sutures. The catheter was connected to the port and tunneled from the subclavicular incision to the access site. Fluoroscopic guidance was then used to cut the catheter to an appropriate length. The catheter was then placed through the peel-away sheath and the peel-away sheath was removed. The catheter tip was parked in excellent location under fluorocoscopic guidance in the SVC just above the right atrium. The pocket was then irrigated with antibiotic impregnated saline and the wound was closed with a running 3-0 Vicryl and a 4-0 Monocryl. The access incision was closed with a single 4-0 Monocryl. The Huber needle was used to withdraw blood and flush the port with heparinized saline. Dermabond was then placed as a dressing. The patient tolerated the procedure well and was taken to the recovery room in stable condition.   Leotis Pain 10/31/2020 12:37 PM   This note was created with Dragon Medical transcription system. Any errors in dictation are purely unintentional.

## 2020-10-31 NOTE — Telephone Encounter (Signed)
I called the pt to let her know that I was able to get her another appt with dr Erlene Quan and she can see pt in [place] office tomorrow at 10 am. I gave her the direct address to go to [facility]. I gave directions to go there and said that when they get in the building there is a desk with volunteer that they can tell the pt the office number for pt to see dr Erlene Quan. Also told her that she should not take coumadin tonight. Leave it off until told to start back. Just continue lovenox twice a day. Both agree to this. I also gave them the main number for the [facility].

## 2020-11-01 ENCOUNTER — Telehealth: Payer: Self-pay | Admitting: *Deleted

## 2020-11-01 ENCOUNTER — Encounter: Payer: Self-pay | Admitting: Vascular Surgery

## 2020-11-01 ENCOUNTER — Other Ambulatory Visit: Admission: RE | Admit: 2020-11-01 | Payer: BC Managed Care – PPO | Source: Home / Self Care

## 2020-11-01 ENCOUNTER — Ambulatory Visit (INDEPENDENT_AMBULATORY_CARE_PROVIDER_SITE_OTHER): Payer: BC Managed Care – PPO | Admitting: Urology

## 2020-11-01 ENCOUNTER — Ambulatory Visit: Payer: BC Managed Care – PPO | Admitting: Urology

## 2020-11-01 ENCOUNTER — Other Ambulatory Visit: Payer: Self-pay

## 2020-11-01 ENCOUNTER — Telehealth: Payer: Self-pay | Admitting: Family Medicine

## 2020-11-01 VITALS — BP 136/90 | HR 128 | Ht 59.0 in | Wt 250.0 lb

## 2020-11-01 DIAGNOSIS — N179 Acute kidney failure, unspecified: Secondary | ICD-10-CM | POA: Diagnosis not present

## 2020-11-01 DIAGNOSIS — N1330 Unspecified hydronephrosis: Secondary | ICD-10-CM | POA: Diagnosis not present

## 2020-11-01 NOTE — Telephone Encounter (Signed)
Ok I will call her

## 2020-11-01 NOTE — Progress Notes (Signed)
11/01/2020 1:05 PM   Tracy Goodman 02/06/1956 [id number]  Referring provider: [name], MD [place],  [place] [address]  Chief Complaint  Patient presents with  . Hydronephrosis    HPI: 65 year old female with metastatic serous carcinoma of GYN origin who presents today for follow-up.  She initially presented with worsening renal function/renal failure.  She underwent bilateral ureteral stents on 10/10/2020.  Notably, she had abnormal tissue at her trigone which was biopsied and consistent with serous carcinoma of GYN origin.  After stents were placed, her creatinine trended back downwards but never back to baseline.  She had not being readmitted with acute on chronic renal failure with a creatinine all the way up to 7.9 on 10/16/2018.  With a Foley catheter and IV hydration, her creatinine went back down to the 2-3 range but never better than this.  Unfortunately, she was also readmitted subsequently with a DVT.  Her anticoagulation is currently being held as she received a port yesterday.  In discussion with Dr. Janese Banks, her treatment regimen will include carboplatinum which is nephrotoxic.  On imaging, she is only had some mild hydronephrosis, never overt.  She also has ascites and omental caking.  Its unclear what role continued obstruction is playing in her renal function.  She did receive a dose of IV contrast around the time of her worsening renal function as well.  She has been tolerating the stents well.  Previously, she was not interested in pursuing nephrostomy tube placement.  She has been tolerating the stents.    Component     Latest Ref Rng & Units 03/12/2020 09/04/2020 09/30/2020 10/04/2020             Creatinine     0.44 - 1.00 mg/dL 1.05 0.90 1.97 (H) 2.55 (H)   Component     Latest Ref Rng & Units 10/09/2020 10/10/2020 10/11/2020 10/15/2020           10:29 AM  Creatinine     0.44 - 1.00 mg/dL 3.52 (H) 3.41 (H) 2.27 (H) 7.87 (H)   Component      Latest Ref Rng & Units 10/15/2020 10/16/2020 10/17/2020 10/18/2020         3:14 PM     Creatinine     0.44 - 1.00 mg/dL 7.90 (H) 6.82 (H) 5.11 (H) 3.89 (H)   Component     Latest Ref Rng & Units 10/19/2020 10/19/2020 10/20/2020 10/21/2020         5:20 AM  2:47 PM    Creatinine     0.44 - 1.00 mg/dL 3.47 (H) 3.38 (H) 3.12 (H) 2.67 (H)   Component     Latest Ref Rng & Units 10/25/2020 10/26/2020 10/26/2020 10/27/2020 10/28/2020         12:22 AM  4:40 AM    Creatinine     0.44 - 1.00 mg/dL 3.26 (H) 3.49 (H) 3.49 (H) 3.00 (H) 2.97 (H)     PMH: Past Medical History:  Diagnosis Date  . Diverticulitis   . Hypertension   . Primary high grade serous adenocarcinoma of ovary [facility])     Surgical History: Past Surgical History:  Procedure Laterality Date  . CESAREAN SECTION    . CYSTOSCOPY W/ URETERAL STENT PLACEMENT Bilateral 10/10/2020   Procedure: CYSTOSCOPY WITH RETROGRADE PYELOGRAM/URETERAL STENT PLACEMENT;  Surgeon: Hollice Espy, MD;  Location: [facility] ORS;  Service: Urology;  Laterality: Bilateral;  . FULGURATION OF BLADDER TUMOR N/A 10/10/2020   Procedure: FULGURATION  OF BLADDER TUMOR;  Surgeon: Hollice Espy, MD;  Location: [facility] ORS;  Service: Urology;  Laterality: N/A;  . PORTA CATH INSERTION N/A 10/31/2020   Procedure: PORTA CATH INSERTION;  Surgeon: Algernon Huxley, MD;  Location: [facility];  Service: Cardiovascular;  Laterality: N/A;  . WRIST FRACTURE SURGERY Left     Home Medications:  Allergies as of 11/01/2020   No Known Allergies     Medication List       Accurate as of Nov 01, 2020  1:05 PM. If you have any questions, ask your nurse or doctor.        acetaminophen 325 MG tablet Commonly known as: TYLENOL Take 2 tablets (650 mg total) by mouth every 6 (six) hours as needed for mild pain, fever or headache (or Fever >/= 101).   amLODipine 10 MG tablet Commonly known as: NORVASC Take 1 tablet (10 mg total) by mouth daily.   dexamethasone 4 MG tablet Commonly known  as: DECADRON Take 2 tablets by mouth once a day starting the day after chemotherapy. Continue for 3 days total. Take with food.   enoxaparin 120 MG/0.8ML injection Commonly known as: LOVENOX Inject 0.8 mLs (120 mg total) into the skin 2 (two) times daily for 7 days. Continue Lovenox injections until the INR is between 2 -3   lidocaine-prilocaine cream Commonly known as: EMLA Apply to affected area once   ondansetron 8 MG tablet Commonly known as: Zofran Take 1 tablet (8 mg total) by mouth 2 (two) times daily as needed. Start on the third day after chemotherapy.   prochlorperazine 10 MG tablet Commonly known as: COMPAZINE Take 1 tablet (10 mg total) by mouth every 6 (six) hours as needed (Nausea or vomiting).   saccharomyces boulardii 250 MG capsule Commonly known as: FLORASTOR Take 250 mg by mouth 2 (two) times daily.   warfarin 5 MG tablet Commonly known as: Coumadin Please take 1-1/2 tablet (7.5 mg) for 3 days, then continue 5 mg daily.  Coumadin dose will be adjusted by your doctor at the appointment.       Allergies: No Known Allergies  Family History: Family History  Problem Relation Age of Onset  . COPD Mother   . Heart failure Mother     Social History:  reports that she has never smoked. She has never used smokeless tobacco. She reports previous alcohol use. She reports that she does not use drugs.   Physical Exam: BP 136/90   Pulse (!) 128   Ht 4\' 11"  (1.499 m)   Wt 250 lb (113.4 kg)   BMI 50.49 kg/m   Constitutional:  Alert and oriented, in wheel chair, accompanied by husband. HEENT: [place] AT, moist mucus membranes.  Trachea midline, no masses. Cardiovascular: No clubbing, cyanosis, or edema. Respiratory: Normal respiratory effort, no increased work of breathing. Skin: No rashes, bruises or suspicious lesions. Neurologic: Grossly intact, no focal deficits, moving all 4 extremities. Psychiatric: Normal mood and affect.  Assessment & Plan:    1. AKI  (acute kidney injury) ([place]) Unclear what role ongoing obstruction/stent failure is playing in her persistently elevated creatinine, initial improvement with stent placement but never back to baseline, some improvement with maximal urinary decompression with Foley complicated by UTI  We discussed we could do a Lasix renal study to assess for drainage although this might not be definitive to help answer the question, discussed the study in detail  Lastly, we discussed going ahead and proceeding with bilateral nephrostomy tube placement  to see if it helps her renal function.  If her renal function does not improve, we can simply remove these.  We also discussed that if she does not tolerate the stents, could also just remove them if she currently has her bilateral ureteral stents in place.  We discussed that if in fact the stents are failing, we would know if her creatinine started to trend down after placement.  We discussed the risk and benefits of this.  We had several conversations now about nephrostomy tubes.  She has been hesitant to want a proceed with this due to concern for yet another procedure as well as comfort especially when sleeping as she sleeps in a supine position.  After lengthy discussion today, she like to go home and think about it/pray with her husband about whether or not to pursue this.  We did discuss the importance of renal optimization especially when receiving carboplatinum.  If she like to pursue this, we will try to get these ASAP, possibly Monday and I will help facilitate this.  This plan was also discussed extensively with Dr. Janese Banks.  2. Bilateral hydronephrosis As above   Hollice Espy, MD  [facility] [address] [place], [place] [address] [phone]  I spent 35 total minutes on the day of the encounter including pre-visit review of the medical record, face-to-face time with the patient, and post visit ordering of  labs/imaging/tests.

## 2020-11-01 NOTE — Telephone Encounter (Signed)
Patient left voicemail stating she has decided not to have anymore treatments and she just would like something to keep her comfortable. Please advise

## 2020-11-01 NOTE — Telephone Encounter (Signed)
Patient called stating that she does not want any further treatment or appointments. She states that she has given it a lot of consideration and she only wants comfort measures. She is scheduled for Tuesday 5/17 for lab /doctor/ infusion

## 2020-11-04 ENCOUNTER — Ambulatory Visit: Admission: RE | Admit: 2020-11-04 | Payer: BC Managed Care – PPO | Source: Ambulatory Visit

## 2020-11-05 ENCOUNTER — Inpatient Hospital Stay: Payer: BC Managed Care – PPO

## 2020-11-05 ENCOUNTER — Inpatient Hospital Stay: Payer: BC Managed Care – PPO | Admitting: Oncology

## 2020-11-05 ENCOUNTER — Other Ambulatory Visit: Payer: Self-pay | Admitting: Hospice and Palliative Medicine

## 2020-11-05 ENCOUNTER — Telehealth: Payer: Self-pay | Admitting: Hospice and Palliative Medicine

## 2020-11-05 MED ORDER — MORPHINE SULFATE (CONCENTRATE) 10 MG /0.5 ML PO SOLN: 5.0000 mg | ORAL | 0 refills | Status: AC | PRN

## 2020-11-05 NOTE — Telephone Encounter (Signed)
Patient was scheduled for follow-up visit today with Dr. Janese Banks but did not feel like coming in.  I called and spoke with patient and husband by phone.  Patient says that she is decided not to pursue any further work-up, procedures, or treatments.  She wants to stay home and focus on comfort until her end-of-life.  We discussed option of hospice involvement.  Both patient and husband were in agreement with hospice.  We also discussed option of removal of stents by Dr. Erlene Quan. The patient says they are not bothering her and she would prefer to leave them in.  I called in an order for hospice to [facility].

## 2020-11-05 NOTE — Progress Notes (Signed)
Will send Rx for morphine elixir for pain/dyspnea in context of hospice/comfort care at home.

## 2020-11-11 ENCOUNTER — Telehealth: Payer: Self-pay

## 2020-11-11 ENCOUNTER — Ambulatory Visit: Payer: Self-pay | Admitting: Physician Assistant

## 2020-11-11 NOTE — Telephone Encounter (Signed)
[facility] Night - Client TELEPHONE ADVICE RECORD AccessNurse Patient Name: Tracy Goodman Gender: Female DOB: [birth year] Age: 65 Y 2 M 20 D Return Phone Number: [phone] (Primary) Address: City/ State/ Zip: [place] [address] Client [facility] Night - Client Client Site [place] Physician Waunita Schooner- MD Contact Type Call Who Is Calling Patient / Member / Family / Caregiver Call Type Triage / Clinical Caller Name Officer Iona Beard Relationship To Patient Other Return Phone Number [phone] (Primary) Chief Complaint DEATH - has occurred or is imminent or pending Reason for Call Symptomatic / Request for Health Information Initial Comment Caller is Officer with [employer] with death notification. Translation No Nurse Assessment Nurse: Jearld Pies, RN, Lovena Le Date/Time Eilene Ghazi Time): 2020/12/06 9:03:47 AM Confirm and document reason for call. If symptomatic, describe symptoms. ---Caller is Officer Iona Beard with [employer] with death notification. Dr. Verda Cumins pt. Does the patient have any new or worsening symptoms? ---No Please document clinical information provided and list any resource used. ---Paging on call with info provided. Disp. Time Eilene Ghazi Time) Disposition Final User 2020-12-06 9:02:35 AM Send to Urgent Queue Kathlynn Grate 12/06/20 9:04:51 AM Paged On Call back to [facility], RNLovena Le 2020-12-06 9:06:59 AM Clinical Call Yes Jearld Pies, RN, Conni Elliot DoctorName Phone DateTime Result/ Outcome Message Type Notes Abelino Derrick- MD [phone] 2020/12/06 9:04:51 AM Paged On Call Back to Call Center Doctor Paged Abelino Derrick- MD Dec 06, 2020 9:06:28 AM Spoke with On Call - General Message Result On call advised "I will let Dr. Einar Pheasant know." PLEASE NOTE: All timestamps contained within this report are represented as [place] Standard  Time. CONFIDENTIALTY NOTICE: This fax transmission is intended only for the addressee. It contains information that is legally privileged, confidential or otherwise protected from use or disclosure. If you are not the intended recipient, you are strictly prohibited from reviewing, disclosing, copying using or disseminating any of this information or taking any action in reliance on or regarding this information. If you have received this fax in error, please notify us immediately by telephone so that we can arrange for its return to [place]. Phone: [phone], Toll-Free: [phone], Fax: [phone] Page: 2 of 2 Call Id:

## 2020-11-11 NOTE — Telephone Encounter (Signed)
Noted, completed death certificate. °

## 2020-11-11 NOTE — Telephone Encounter (Signed)
[facility] Night - Client Nonclinical Telephone Record AccessNurse Client [facility] Night - Client Client Site [place] Physician Waunita Schooner- MD Contact Type Call Who Is Calling Physician / Provider / Hospital Call Type Provider Call [facility] Page Now Reason for Call Request to speak to Physician Initial Comment Caller states a patient passed and needs a death certificate signed. Patient Name Tracy Goodman Patient DOB 06/16/1956 Requesting Provider Jerre Simon Physician Number [place] Name [place]. Time Disposition Final User November 29, 2020 9:58:00 AM Send to [place] 2020-11-29 10:08:58 AM Rosalita Chessman Provider Sharia Reeve November 29, 2020 10:09:31 AM Page Completed Yes Sharia Reeve Paging DoctorName Phone DateTime Result/Outcome Message Type Notes Abelino Derrick- MD [phone] Nov 29, 2020 10:08:58 AM Called On Call Provider - Reached Doctor Paged Abelino Derrick- MD 11/29/20 10:09:20 AM Spoke with On Call - General Message Result Spoke with on call, provided PHI and warn transferred to [employer]. Call Closed By: Sharia Reeve Transaction Date/Time: 11-29-20 9:54:25 AM (ET)

## 2020-11-20 ENCOUNTER — Encounter: Payer: Self-pay | Admitting: Oncology

## 2020-11-20 DEATH — deceased

## 2020-11-22 ENCOUNTER — Telehealth: Payer: Self-pay | Admitting: *Deleted

## 2020-11-22 NOTE — Telephone Encounter (Signed)
Got a paper from foundation about getting one code approved through [place]. I called foundation and let them know that she passed away 11/25/2022 and we got the notification 5/24. So pt. Was never given chemo due to passing away. The lady on the phone said she sent it to the financial and they will take care of it.

## 2022-06-20 IMAGING — MG MM DIGITAL DIAGNOSTIC UNILAT*R* W/ TOMO W/ CAD
8 series · 8 of 24 positions shown · non-contrast
Comparison: Previous exam(s).

CLINICAL DATA: 65-year-old female presenting with a new palpable
area of concern in the right breast.

EXAM:
DIGITAL DIAGNOSTIC UNILATERAL RIGHT MAMMOGRAM WITH TOMOSYNTHESIS AND
CAD; ULTRASOUND RIGHT BREAST LIMITED
TECHNIQUE: Right digital diagnostic mammography and breast tomosynthesis was
performed. The images were evaluated with computer-aided detection.;
Targeted ultrasound examination of the right breast was performed

[R MLO synth-2D]
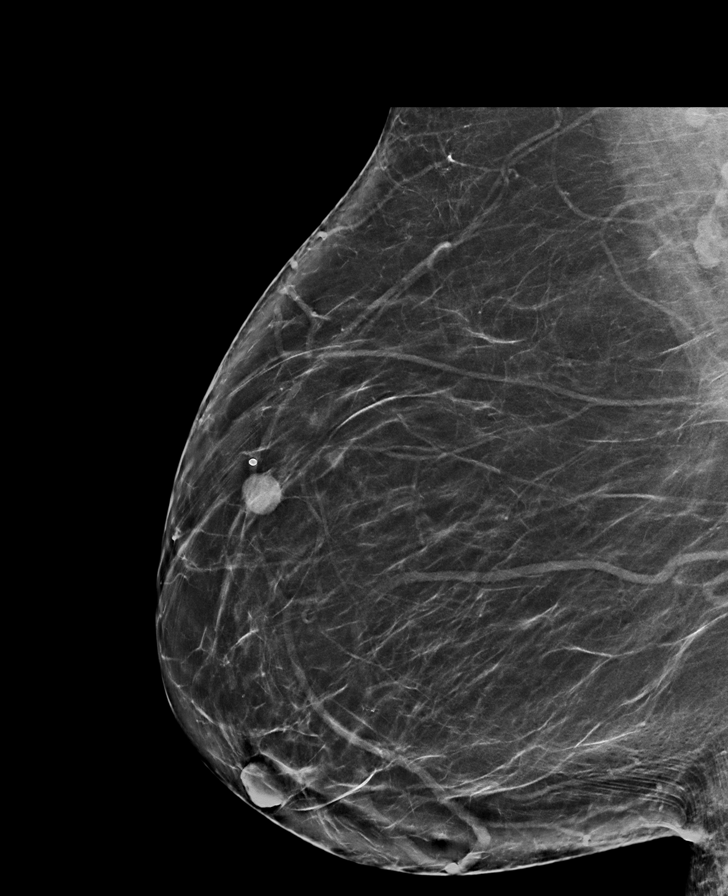

[R CC synth-2D (1 of 3)]
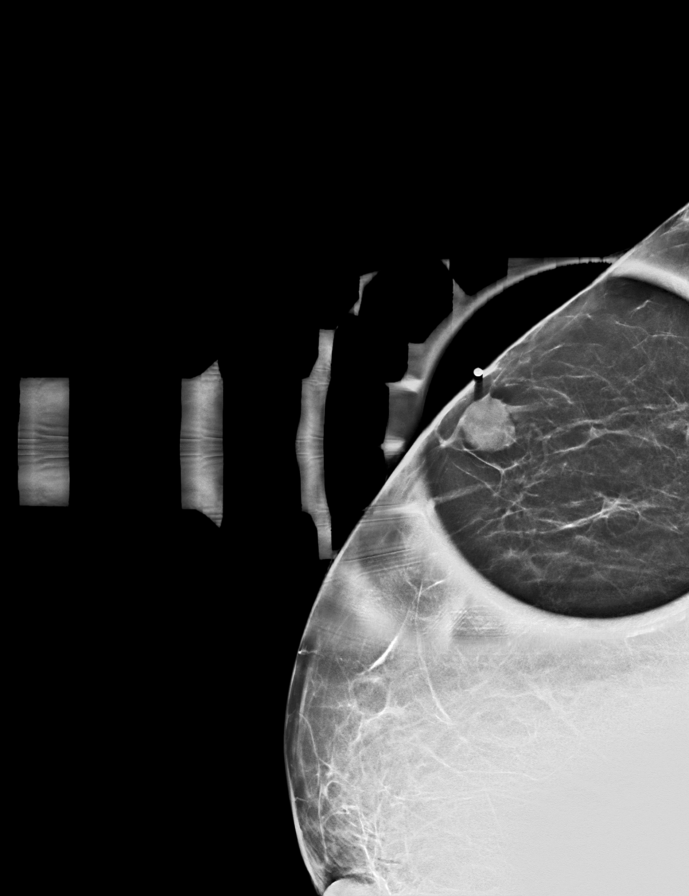

[R CC synth-2D (2 of 3)]
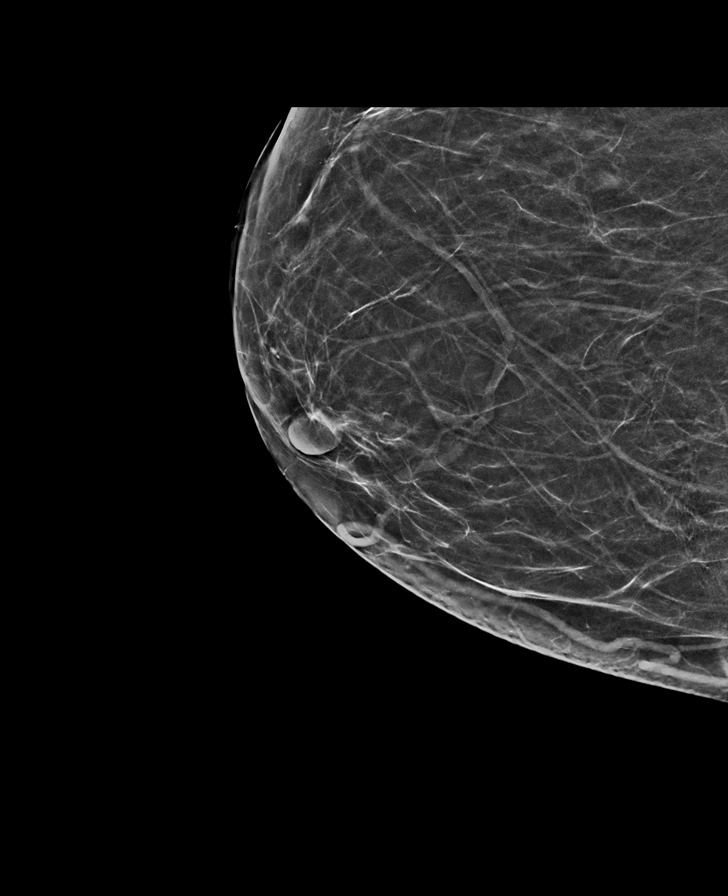

[R CC synth-2D (3 of 3)]
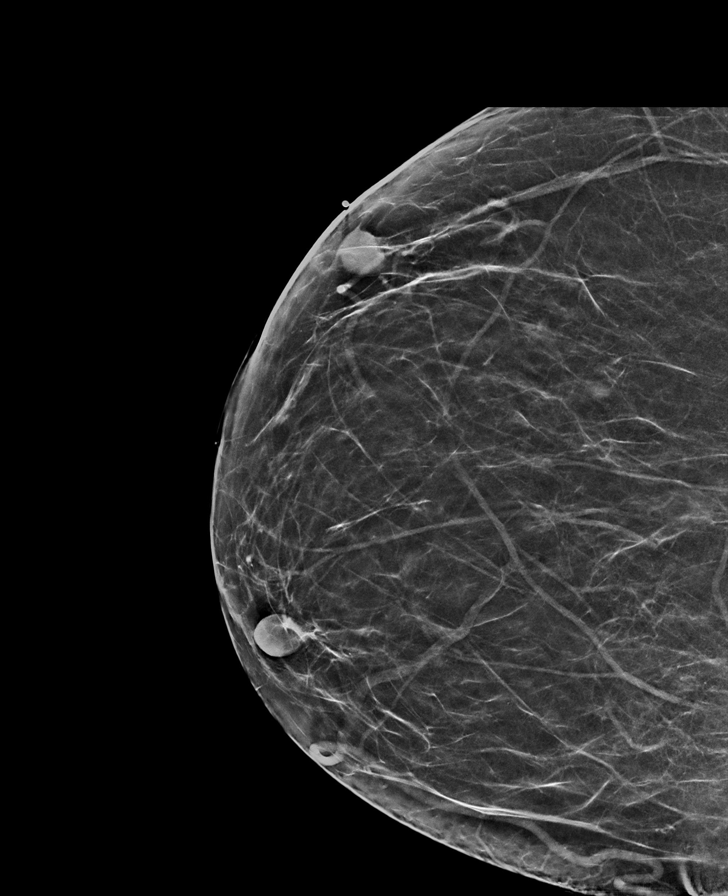

[R CC tomo (1 of 3) · tomo slice 33/66.0]
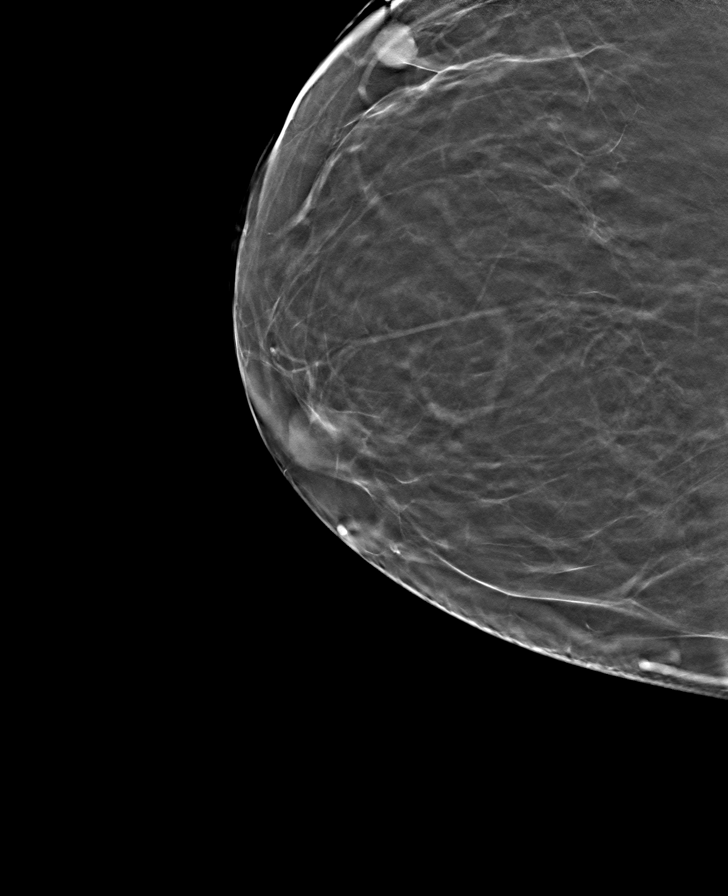

[R MLO tomo · tomo slice 41/80.0]
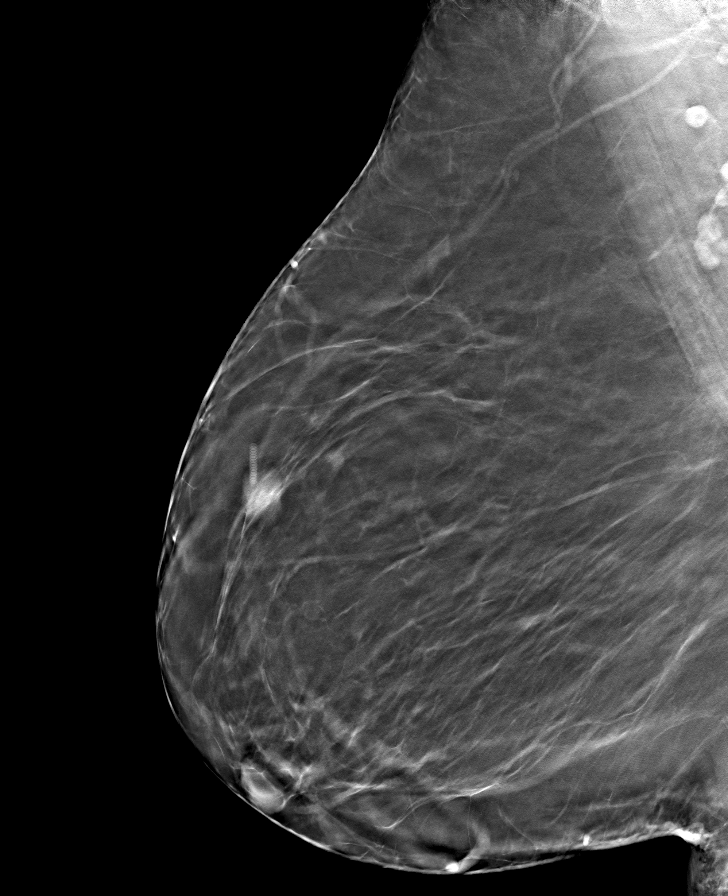

[R CC tomo (2 of 3) · tomo slice 22/43.0]
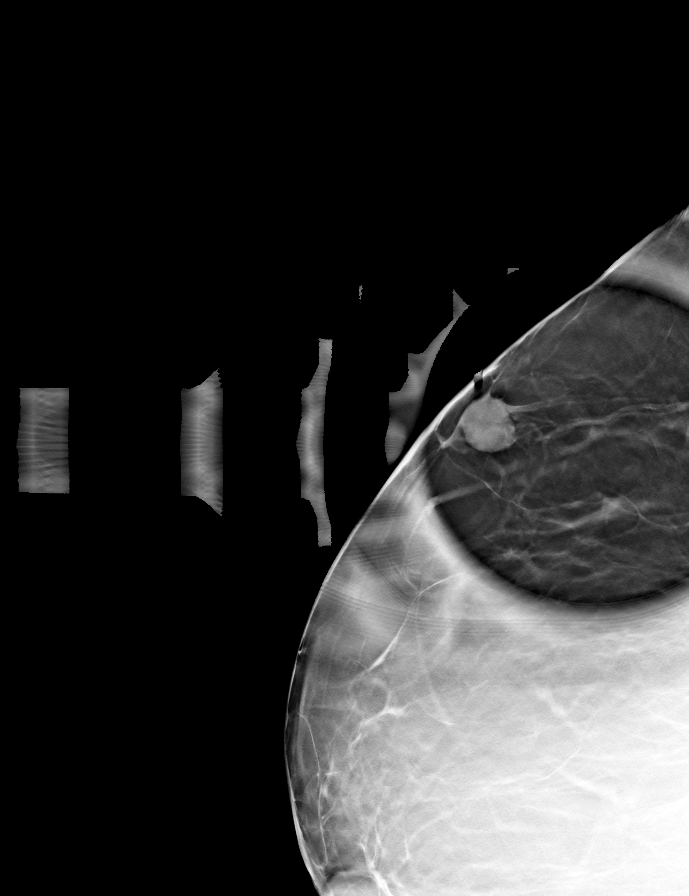

[R CC tomo (3 of 3) · tomo slice 33/65.0]
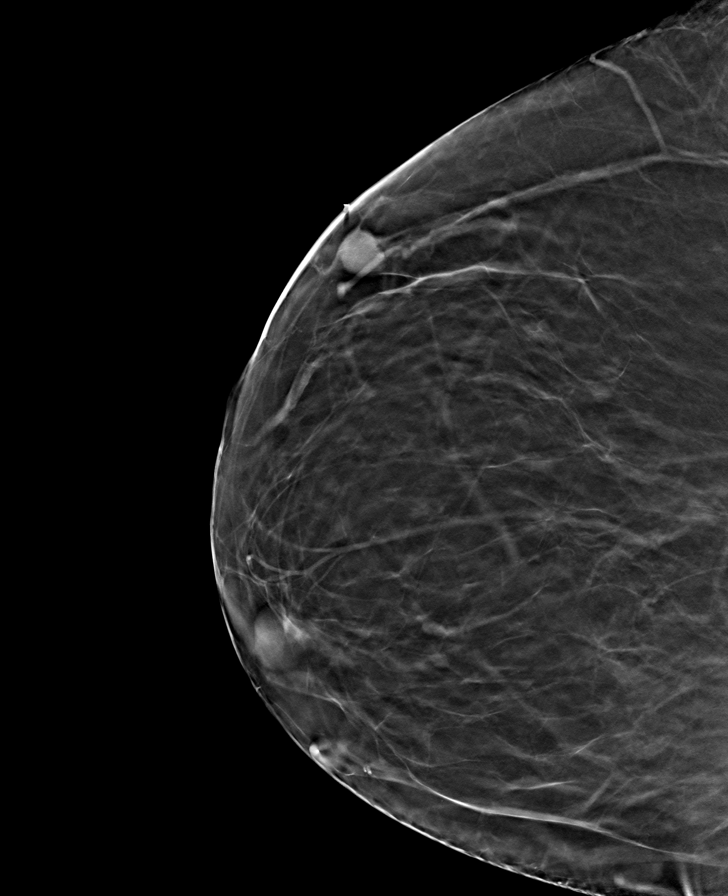

[8 of 24 positions shown; findings below may reference images not displayed]

ACR Breast Density Category b: There are scattered areas of
fibroglandular density.
FINDINGS: Mammogram:

A skin BB marks the site of concern reported by the patient in the
upper outer right breast. A spot tangential view of this area is
performed in addition to standard views. There is a round
circumscribed mass with central fatty hilum measuring approximately
1.2 cm at the palpable site, most likely an intramammary lymph node.
There are no additional new findings elsewhere in the right breast.

On physical exam at site of concern reported by the patient I feel a
discrete mass.

Ultrasound:

Targeted ultrasound is performed at the palpable site of concern in
the right breast at 9:30 o'clock 9 cm from the nipple demonstrating
a round circumscribed hypoechoic mass with central fatty hilum
measuring 1.3 x 1.3 x 1.1 cm, consistent with a intramammary lymph
node. There is cortical thickening measuring 0.5 cm. This
corresponds to the mammographic finding. Targeted ultrasound of the
right axilla demonstrates normal lymph nodes.
IMPRESSION: Abnormal intramammary lymph node in the right breast at 9:30 o'clock
at the palpable site reported by the patient.

RECOMMENDATION:
Ultrasound-guided core needle biopsy of the right breast mass at
9:30 o'clock.

I have discussed the findings and recommendations with the patient
who agrees to proceed with biopsy. The patient will be contacted by
our scheduler to arrange the biopsy appointment.

BI-RADS CATEGORY  4: Suspicious.
